# Patient Record
Sex: Male | Born: 1945 | Race: White | Hispanic: No | Marital: Married | Smoking: Former smoker
Health system: Southern US, Community
[De-identification: ages and names within clinical notes are randomized; demographics above are authoritative.]

## PROBLEM LIST (undated history)

## (undated) DIAGNOSIS — R7989 Other specified abnormal findings of blood chemistry: Secondary | ICD-10-CM

## (undated) DIAGNOSIS — F329 Major depressive disorder, single episode, unspecified: Secondary | ICD-10-CM

## (undated) DIAGNOSIS — G43109 Migraine with aura, not intractable, without status migrainosus: Secondary | ICD-10-CM

## (undated) DIAGNOSIS — R718 Other abnormality of red blood cells: Secondary | ICD-10-CM

## (undated) DIAGNOSIS — M069 Rheumatoid arthritis, unspecified: Secondary | ICD-10-CM

## (undated) DIAGNOSIS — N62 Hypertrophy of breast: Secondary | ICD-10-CM

## (undated) DIAGNOSIS — R030 Elevated blood-pressure reading, without diagnosis of hypertension: Secondary | ICD-10-CM

## (undated) DIAGNOSIS — IMO0002 Reserved for concepts with insufficient information to code with codable children: Secondary | ICD-10-CM

## (undated) DIAGNOSIS — E785 Hyperlipidemia, unspecified: Secondary | ICD-10-CM

## (undated) DIAGNOSIS — G5600 Carpal tunnel syndrome, unspecified upper limb: Secondary | ICD-10-CM

## (undated) DIAGNOSIS — R3129 Other microscopic hematuria: Secondary | ICD-10-CM

## (undated) DIAGNOSIS — N200 Calculus of kidney: Secondary | ICD-10-CM

## (undated) DIAGNOSIS — J449 Chronic obstructive pulmonary disease, unspecified: Secondary | ICD-10-CM

## (undated) DIAGNOSIS — R945 Abnormal results of liver function studies: Secondary | ICD-10-CM

## (undated) DIAGNOSIS — J439 Emphysema, unspecified: Secondary | ICD-10-CM

## (undated) DIAGNOSIS — F419 Anxiety disorder, unspecified: Secondary | ICD-10-CM

## (undated) DIAGNOSIS — Z923 Personal history of irradiation: Secondary | ICD-10-CM

## (undated) DIAGNOSIS — F410 Panic disorder [episodic paroxysmal anxiety] without agoraphobia: Secondary | ICD-10-CM

## (undated) DIAGNOSIS — F32A Depression, unspecified: Secondary | ICD-10-CM

## (undated) DIAGNOSIS — Z720 Tobacco use: Secondary | ICD-10-CM

## (undated) HISTORY — DX: Tobacco use: Z72.0

## (undated) HISTORY — DX: Hypertrophy of breast: N62

## (undated) HISTORY — DX: Other microscopic hematuria: R31.29

## (undated) HISTORY — DX: Abnormal results of liver function studies: R94.5

## (undated) HISTORY — DX: Other specified abnormal findings of blood chemistry: R79.89

## (undated) HISTORY — DX: Personal history of irradiation: Z92.3

## (undated) HISTORY — DX: Calculus of kidney: N20.0

## (undated) HISTORY — DX: Anxiety disorder, unspecified: F41.9

## (undated) HISTORY — DX: Carpal tunnel syndrome, unspecified upper limb: G56.00

## (undated) HISTORY — DX: Elevated blood-pressure reading, without diagnosis of hypertension: R03.0

## (undated) HISTORY — DX: Hyperlipidemia, unspecified: E78.5

## (undated) HISTORY — DX: Migraine with aura, not intractable, without status migrainosus: G43.109

## (undated) HISTORY — DX: Emphysema, unspecified: J43.9

## (undated) HISTORY — PX: COLONOSCOPY: SHX174

## (undated) HISTORY — DX: Panic disorder (episodic paroxysmal anxiety): F41.0

## (undated) HISTORY — DX: Reserved for concepts with insufficient information to code with codable children: IMO0002

## (undated) HISTORY — DX: Chronic obstructive pulmonary disease, unspecified: J44.9

## (undated) HISTORY — DX: Other abnormality of red blood cells: R71.8

## (undated) HISTORY — PX: APPENDECTOMY: SHX54

## (undated) HISTORY — DX: Rheumatoid arthritis, unspecified: M06.9

---

## 1999-09-03 ENCOUNTER — Encounter: Admission: RE | Admit: 1999-09-03 | Discharge: 1999-09-03 | Payer: Self-pay | Admitting: Rheumatology

## 1999-09-03 ENCOUNTER — Encounter: Payer: Self-pay | Admitting: Rheumatology

## 2004-10-18 HISTORY — PX: CYSTOSTOMY W/ BLADDER BIOPSY: SHX1431

## 2005-04-29 ENCOUNTER — Encounter: Admission: RE | Admit: 2005-04-29 | Discharge: 2005-04-29 | Payer: Self-pay | Admitting: Rheumatology

## 2005-05-05 ENCOUNTER — Encounter: Admission: RE | Admit: 2005-05-05 | Discharge: 2005-05-05 | Payer: Self-pay | Admitting: Rheumatology

## 2005-05-12 ENCOUNTER — Ambulatory Visit (HOSPITAL_COMMUNITY): Admission: RE | Admit: 2005-05-12 | Discharge: 2005-05-12 | Payer: Self-pay | Admitting: Urology

## 2005-05-12 ENCOUNTER — Ambulatory Visit (HOSPITAL_BASED_OUTPATIENT_CLINIC_OR_DEPARTMENT_OTHER): Admission: RE | Admit: 2005-05-12 | Discharge: 2005-05-12 | Payer: Self-pay | Admitting: Urology

## 2005-05-12 ENCOUNTER — Encounter (INDEPENDENT_AMBULATORY_CARE_PROVIDER_SITE_OTHER): Payer: Self-pay | Admitting: Specialist

## 2005-08-03 ENCOUNTER — Encounter: Admission: RE | Admit: 2005-08-03 | Discharge: 2005-08-03 | Payer: Self-pay | Admitting: Rheumatology

## 2005-10-05 ENCOUNTER — Encounter: Admission: RE | Admit: 2005-10-05 | Discharge: 2005-10-05 | Payer: Self-pay | Admitting: Rheumatology

## 2006-01-31 ENCOUNTER — Encounter: Admission: RE | Admit: 2006-01-31 | Discharge: 2006-01-31 | Payer: Self-pay | Admitting: Rheumatology

## 2006-05-11 ENCOUNTER — Encounter: Admission: RE | Admit: 2006-05-11 | Discharge: 2006-05-11 | Payer: Self-pay | Admitting: Thoracic Surgery

## 2007-03-08 ENCOUNTER — Ambulatory Visit: Payer: Self-pay | Admitting: Thoracic Surgery

## 2007-03-08 ENCOUNTER — Encounter: Admission: RE | Admit: 2007-03-08 | Discharge: 2007-03-08 | Payer: Self-pay | Admitting: Thoracic Surgery

## 2007-09-07 ENCOUNTER — Ambulatory Visit: Payer: Self-pay | Admitting: Thoracic Surgery

## 2007-09-07 ENCOUNTER — Encounter: Admission: RE | Admit: 2007-09-07 | Discharge: 2007-09-07 | Payer: Self-pay | Admitting: Thoracic Surgery

## 2007-10-10 ENCOUNTER — Ambulatory Visit (HOSPITAL_COMMUNITY): Admission: RE | Admit: 2007-10-10 | Discharge: 2007-10-10 | Payer: Self-pay | Admitting: Internal Medicine

## 2008-01-01 ENCOUNTER — Encounter (HOSPITAL_COMMUNITY): Admission: RE | Admit: 2008-01-01 | Discharge: 2008-03-04 | Payer: Self-pay | Admitting: Internal Medicine

## 2008-02-03 ENCOUNTER — Encounter: Admission: RE | Admit: 2008-02-03 | Discharge: 2008-02-03 | Payer: Self-pay | Admitting: Rheumatology

## 2008-03-25 ENCOUNTER — Encounter (HOSPITAL_COMMUNITY): Admission: RE | Admit: 2008-03-25 | Discharge: 2008-05-31 | Payer: Self-pay | Admitting: Internal Medicine

## 2008-04-01 ENCOUNTER — Encounter: Admission: RE | Admit: 2008-04-01 | Discharge: 2008-04-01 | Payer: Self-pay | Admitting: Internal Medicine

## 2008-06-18 ENCOUNTER — Encounter (HOSPITAL_COMMUNITY): Admission: RE | Admit: 2008-06-18 | Discharge: 2008-06-18 | Payer: Self-pay | Admitting: Internal Medicine

## 2008-07-04 ENCOUNTER — Encounter: Admission: RE | Admit: 2008-07-04 | Discharge: 2008-07-04 | Payer: Self-pay | Admitting: Thoracic Surgery

## 2008-07-04 ENCOUNTER — Ambulatory Visit: Payer: Self-pay | Admitting: Thoracic Surgery

## 2008-09-10 ENCOUNTER — Ambulatory Visit (HOSPITAL_COMMUNITY): Admission: RE | Admit: 2008-09-10 | Discharge: 2008-09-10 | Payer: Self-pay | Admitting: Internal Medicine

## 2008-12-02 ENCOUNTER — Encounter (HOSPITAL_COMMUNITY): Admission: RE | Admit: 2008-12-02 | Discharge: 2009-02-20 | Payer: Self-pay | Admitting: Internal Medicine

## 2009-03-24 ENCOUNTER — Encounter (HOSPITAL_COMMUNITY): Admission: RE | Admit: 2009-03-24 | Discharge: 2009-06-22 | Payer: Self-pay | Admitting: Internal Medicine

## 2009-09-15 ENCOUNTER — Encounter (HOSPITAL_COMMUNITY): Admission: RE | Admit: 2009-09-15 | Discharge: 2009-10-17 | Payer: Self-pay | Admitting: Internal Medicine

## 2009-12-12 ENCOUNTER — Encounter (HOSPITAL_COMMUNITY): Admission: RE | Admit: 2009-12-12 | Discharge: 2010-03-10 | Payer: Self-pay | Admitting: Internal Medicine

## 2010-03-10 ENCOUNTER — Encounter (HOSPITAL_COMMUNITY): Admission: RE | Admit: 2010-03-10 | Discharge: 2010-03-10 | Payer: Self-pay | Admitting: Internal Medicine

## 2010-10-30 ENCOUNTER — Encounter
Admission: RE | Admit: 2010-10-30 | Discharge: 2010-10-30 | Payer: Self-pay | Source: Home / Self Care | Attending: Internal Medicine | Admitting: Internal Medicine

## 2011-03-02 NOTE — Letter (Signed)
July 04, 2008   Mark A. Perini, MD  [address]  [place], [place] [address]   Re:  MARCANTHONY, SLEIGHT                 DOB:  1946-01-15   Dear Loraine Leriche:   I saw the patient in the office today.  We have followed him for over 2  years with multiple CT scan for followup of some small pulmonary  nodules.  They got a repeat today and showed no evidence of change.  I  think these were all inflammatory.  I would just recommended that he get  at least a chest x-ray once a year, so I have encouraged him to refrain  from smoking.  His blood pressure was 154/75, pulse 65, respirations 18,  and sats were 96%.   Ines Bloomer, M.D.  Electronically Signed   DPB/MEDQ  D:  07/04/2008  T:  07/04/2008  Job:  [id number]

## 2011-03-05 NOTE — Assessment & Plan Note (Signed)
OFFICE VISIT   Dyk, Whitley L  DOB:  05/08/1946                                        September 08, 2007  CHART #:  [id number]   Mr. Rueth came for followup today.  His blood pressure is 170/70.  Pulse  86.  Respirations 18.  Saturations were 97%.  His CT scan showed no  evidence of change in the small nodules that he has.  It has now been  over 18 months, and with no change in these nodules.  I feel these are  probably inflammatory.  We will see him back again in 6 months for a  final check.   Ines Bloomer, M.D.  Electronically Signed   DPB/MEDQ  D:  09/08/2007  T:  09/09/2007  Job:  [id number]

## 2011-03-05 NOTE — Op Note (Signed)
NAMETERION, HEDMAN                 ACCOUNT NO.:  [id number]   MEDICAL RECORD NO.:  [id number]          PATIENT TYPE:  AMB   LOCATION:  [facility]                         FACILITY:  [facility]   PHYSICIAN:  Maretta Bees. Vonita Moss, M.D.DATE OF BIRTH:  1946/01/16   DATE OF PROCEDURE:  05/12/2005  DATE OF DISCHARGE:                                 OPERATIVE REPORT   PREOPERATIVE DIAGNOSIS:  Hematuria and positive NMP-22.   POSTOPERATIVE DIAGNOSIS:  Hematuria and positive NMP-22.   PROCEDURE:  Cystoscopy, cold cup bladder biopsy and bilateral retrograde  pyelograms with interpretation.   SURGEON:  Dr. Larey Dresser.   ANESTHESIA:  General.   INDICATIONS:  This 65 year old gentleman had microhematuria and a stone  protocol CT scan done in our office that was unremarkable. And NMP-22 was  done and it was positive so I was concerned about high risk of transitional  cell carcinoma of the urinary tract. So rather than an office cystoscopy, he  was scheduled for outpatient Cysto and workup under anesthesia.   DESCRIPTION OF PROCEDURE:  The patient was brought to the operating room,  placed in lithotomy position, external genitalia were prepped and draped in  the usual fashion. He was cystoscoped and the anterior urethra was normal.  The prostatic urethra was unremarkable. The bladder had no stones, tumors or  localized lesions. There was some very minimal diffuse increased  vascularity.   Bilateral retrograde pyelograms were obtained using the cone-tip ureteral  catheter and his upper tracts were nonobstructed delicate and no filling  defects were seen in the ureters or the pyelocaliceal systems.   Cold cup bladder biopsies were then taken randomly from the right wall, left  wall and dome and sent to pathology. The biopsy sites were fulgurated with  the Bugbee electrode. The bladder was emptied, the cystoscope removed and  the patient sent to the recovery room in good condition having tolerated the  procedure well.     _________________    LJP/MEDQ  D:  05/12/2005  T:  05/12/2005  Job:  [id number]   cc:   Loraine Leriche A. Waynard Edwards, M.D.  [address]  [place]  [place] [address]  Fax: [phone]

## 2011-09-27 ENCOUNTER — Encounter: Payer: Self-pay | Admitting: Pulmonary Disease

## 2011-09-28 ENCOUNTER — Ambulatory Visit (INDEPENDENT_AMBULATORY_CARE_PROVIDER_SITE_OTHER): Payer: 59 | Admitting: Pulmonary Disease

## 2011-09-28 ENCOUNTER — Encounter: Payer: Self-pay | Admitting: Pulmonary Disease

## 2011-09-28 DIAGNOSIS — J438 Other emphysema: Secondary | ICD-10-CM

## 2011-09-28 DIAGNOSIS — IMO0001 Reserved for inherently not codable concepts without codable children: Secondary | ICD-10-CM | POA: Insufficient documentation

## 2011-09-28 DIAGNOSIS — J8410 Pulmonary fibrosis, unspecified: Secondary | ICD-10-CM

## 2011-09-28 NOTE — Progress Notes (Signed)
  Subjective:    Patient ID: Kenneth [name], male    DOB: [birth year], 65 y.o.   MRN: [id number]  HPI The patient is a 65 year old male who I have been asked to see for questionable fibrotic changes on a recent chest CT.  The patient has a long-standing history of tobacco abuse, and carries the diagnosis of COPD.  He has not had PFTs for many years.  He has recently had a chest x-ray which showed increased interstitial markings, and this was followed by a chest CT for further evaluation.  This showed very significant emphysematous changes throughout both lungs, as well a subpleural cystic/bleb changes that are more consistent with emphysema than honeycombing.  There was no traction bronchiectasis, and very little subpleural reticular changes.  There were some fibrotic appearing abnormalities primarily in the right lower lobe, but there were no findings of classic interstitial lung disease.  The patient states that he does have dyspnea on exertion that he thinks is worse from one year ago.  He can walk a fairly long distance at a paced speed without significant shortness of breath.  He will not get winded bringing groceries in from the car, but does have difficulties going up one flight of steps due to musculoskeletal issues.  The patient has a cough that primarily occurs in the morning, and is productive of mucus.  His wife also notices a cough and breathing issues at night while sleeping.  The patient is a retired Data processing manager, and denies any known exposure to asbestos or other particulate exposures.  He has no unusual hobbies.  He does have a history of rheumatoid arthritis, for which he is on methotrexate.   Review of Systems  Constitutional: Positive for appetite change. Negative for fever and unexpected weight change.  HENT: Positive for congestion and sneezing. Negative for ear pain, nosebleeds, sore throat, rhinorrhea, trouble swallowing, dental problem, postnasal drip and sinus pressure.     Eyes: Negative for redness and itching.  Respiratory: Positive for cough. Negative for chest tightness, shortness of breath and wheezing.   Cardiovascular: Negative for palpitations and leg swelling.  Gastrointestinal: Negative for nausea and vomiting.  Genitourinary: Negative for dysuria.  Musculoskeletal: Positive for joint swelling.  Skin: Negative for rash.  Neurological: Positive for headaches.  Hematological: Does not bruise/bleed easily.  Psychiatric/Behavioral: Positive for dysphoric mood. The patient is not nervous/anxious.        Objective:   Physical Exam Constitutional:  Well developed, no acute distress  HENT:  Nares patent without discharge, but significantly narrowed bilat  Oropharynx without exudate, palate and uvula are elongated.   Eyes:  Perrla, eomi, no scleral icterus  Neck:  No JVD, no TMG  Cardiovascular:  Normal rate, regular rhythm, no rubs or gallops.  No murmurs        Intact distal pulses  Pulmonary :  Normal breath sounds, no stridor or respiratory distress   No rhonchi, or wheezing.  Minimal crackles in bases  Abdominal:  Soft, nondistended, bowel sounds present.  No tenderness noted.   Musculoskeletal:  No lower extremity edema noted.  Lymph Nodes:  No cervical lymphadenopathy noted  Skin:  No cyanosis noted  Neurologic:  Alert, appropriate, moves all 4 extremities without obvious deficit.         Assessment & Plan:

## 2011-09-28 NOTE — Assessment & Plan Note (Signed)
The patient has very significant emphysematous changes on his CT chest.  He has a history of long-standing smoking, and does have dyspnea on exertion to some degree.  I think he would benefit from full pulmonary function studies, and would consider a trial of long-acting bronchodilators if he indeed has significant obstructive disease.  I have explained to him that smoking cessation is his best and most important treatment.

## 2011-09-28 NOTE — Assessment & Plan Note (Signed)
The patient's CT chest shows primarily significant emphysematous and cystic changes throughout both lungs.  There are fibrotic changes primarily in the right base, but none of the classic findings for true interstitial lung disease.  He had no subpleural reticular changes, no true honeycombing, and no traction bronchiectasis.  I suspect this is all related to his emphysema, and he may have a component of respiratory bronchiolitis as well.  Given his known rheumatoid arthritis, I cannot totally exclude a very early interstitial process, but I think this unlikely.  There is really nothing to make me overly suspicious of methotrexate induced lung disease.  I do think it would be worthwhile to have a yearly chest x-ray to monitor the patient.

## 2011-09-28 NOTE — Patient Instructions (Signed)
Will set up for breathing tests, and see you back same day for review. Stop smoking.  This is the best thing you can do for yourself.

## 2011-10-29 ENCOUNTER — Ambulatory Visit: Payer: 59 | Admitting: Pulmonary Disease

## 2011-11-12 ENCOUNTER — Ambulatory Visit: Payer: 59 | Admitting: Pulmonary Disease

## 2011-12-06 ENCOUNTER — Ambulatory Visit: Payer: 59 | Admitting: Pulmonary Disease

## 2011-12-08 ENCOUNTER — Encounter: Payer: Self-pay | Admitting: Pulmonary Disease

## 2011-12-08 ENCOUNTER — Ambulatory Visit (INDEPENDENT_AMBULATORY_CARE_PROVIDER_SITE_OTHER): Payer: 59 | Admitting: Pulmonary Disease

## 2011-12-08 DIAGNOSIS — J438 Other emphysema: Secondary | ICD-10-CM

## 2011-12-08 DIAGNOSIS — IMO0001 Reserved for inherently not codable concepts without codable children: Secondary | ICD-10-CM

## 2011-12-08 DIAGNOSIS — J8410 Pulmonary fibrosis, unspecified: Secondary | ICD-10-CM

## 2011-12-08 LAB — PULMONARY FUNCTION TEST

## 2011-12-08 NOTE — Assessment & Plan Note (Signed)
The patient has only mild obstructive disease by his recent pulmonary function studies.  I have talked with him about trying a maintenance bronchodilator regimen to see if this will help his exertional tolerance.  The patient feels that his functional status is more limited by his arthritis symptoms than his breathing, but admits that he significantly limits his exertional activities overall.  I would like for him to try Spiriva for approximately the next 3 weeks to see if he notices a difference.  If he does not, I would leave him on albuterol for rescue alone.  I have also stressed to him that total smoking cessation is really his best treatment for his underlying emphysema.

## 2011-12-08 NOTE — Patient Instructions (Signed)
You need to try and stop smoking completely Will try spiriva one inhalation each am for next 3 weeks to see if you notice a difference.  Please call us in 3 weeks with your response. Will give you a sample of a rescue inhaler (xopenex).  Use 2 puffs up to every 6hrs if you have a breathing issues or emergency.

## 2011-12-08 NOTE — Progress Notes (Signed)
PFT done today. 

## 2011-12-08 NOTE — Progress Notes (Signed)
  Subjective:    Patient ID: Kenneth [name], male    DOB: October 29, 1945, 66 y.o.   MRN: [id number]  HPI The patient comes in today for followup after his recent pulmonary function studies.  He was found to have mild airflow obstruction, mild restriction, and a mild reduction in his diffusion capacity.  I have reviewed the study with him in detail, and answered all of his questions.   Review of Systems  Constitutional: Negative for fever and unexpected weight change.  HENT: Positive for ear pain, nosebleeds, congestion, rhinorrhea, sneezing, postnasal drip and sinus pressure. Negative for sore throat, trouble swallowing and dental problem.   Eyes: Positive for redness and itching.  Respiratory: Positive for cough and wheezing. Negative for chest tightness and shortness of breath.   Cardiovascular: Negative for palpitations and leg swelling.  Gastrointestinal: Positive for nausea. Negative for vomiting.  Genitourinary: Negative for dysuria.  Musculoskeletal: Positive for joint swelling.  Skin: Negative for rash.  Neurological: Positive for headaches.  Hematological: Bruises/bleeds easily.  Psychiatric/Behavioral: Positive for dysphoric mood. The patient is not nervous/anxious.        Objective:   Physical Exam Thin male in no acute distress Nose without purulence or discharge noted Chest with basilar crackles, no wheezes Lower extremities without edema, no cyanosis Alert and oriented, moves all 4 extremities.       Assessment & Plan:

## 2011-12-08 NOTE — Assessment & Plan Note (Signed)
The patient has very mild restriction on his PFTs, and most likely his interstitial changes are not associated with his rheumatoid disease or methotrexate given the CT scan appearance.  However, I would recommend a yearly chest x-ray to follow him for possible progression.

## 2012-03-01 ENCOUNTER — Encounter (HOSPITAL_COMMUNITY)
Admission: RE | Admit: 2012-03-01 | Discharge: 2012-03-01 | Disposition: A | Discharge status: Home / Self Care | Payer: 59 | Source: Ambulatory Visit | Attending: Internal Medicine | Admitting: Internal Medicine

## 2012-03-01 ENCOUNTER — Encounter (HOSPITAL_COMMUNITY): Payer: Self-pay

## 2012-03-01 DIAGNOSIS — M810 Age-related osteoporosis without current pathological fracture: Secondary | ICD-10-CM | POA: Insufficient documentation

## 2012-03-01 MED ORDER — SODIUM CHLORIDE 0.9 % IV SOLN
INTRAVENOUS | Status: AC
  Administered 2012-03-01: 250 mL via INTRAVENOUS

## 2012-03-01 MED ORDER — ZOLEDRONIC ACID 5 MG/100ML IV SOLN
5.0000 mg | Freq: Once | INTRAVENOUS | Status: AC
  Administered 2012-03-01: 5 mg via INTRAVENOUS
  Filled 2012-03-01: qty 100

## 2012-03-01 NOTE — Discharge Instructions (Signed)
Zoledronic Acid injection (Paget's Disease, Osteoporosis) What is this medicine? ZOLEDRONIC ACID (ZOE le dron ik AS id) lowers the amount of calcium loss from bone. It is used to treat Paget's disease and osteoporosis in women. This medicine may be used for other purposes; ask your health care provider or pharmacist if you have questions. What should I tell my health care provider before I take this medicine? They need to know if you have any of these conditions: -aspirin-sensitive asthma -dental disease -kidney disease -low levels of calcium in the blood -past surgery on the parathyroid gland or intestines -an unusual or allergic reaction to zoledronic acid, other medicines, foods, dyes, or preservatives -pregnant or trying to get pregnant -breast-feeding How should I use this medicine? This medicine is for infusion into a vein. It is given by a health care professional in a hospital or clinic setting. Talk to your pediatrician regarding the use of this medicine in children. This medicine is not approved for use in children. Overdosage: If you think you have taken too much of this medicine contact a poison control center or emergency room at once. NOTE: This medicine is only for you. Do not share this medicine with others. What if I miss a dose? It is important not to miss your dose. Call your doctor or health care professional if you are unable to keep an appointment. What may interact with this medicine? -certain antibiotics given by injection -NSAIDs, medicines for pain and inflammation, like ibuprofen or naproxen -some diuretics like bumetanide, furosemide -teriparatide This list may not describe all possible interactions. Give your health care provider a list of all the medicines, herbs, non-prescription drugs, or dietary supplements you use. Also tell them if you smoke, drink alcohol, or use illegal drugs. Some items may interact with your medicine. What should I watch for while  using this medicine? Visit your doctor or health care professional for regular checkups. It may be some time before you see the benefit from this medicine. Do not stop taking your medicine unless your doctor tells you to. Your doctor may order blood tests or other tests to see how you are doing. Women should inform their doctor if they wish to become pregnant or think they might be pregnant. There is a potential for serious side effects to an unborn child. Talk to your health care professional or pharmacist for more information. You should make sure that you get enough calcium and vitamin D while you are taking this medicine. Discuss the foods you eat and the vitamins you take with your health care professional. Some people who take this medicine have severe bone, joint, and/or muscle pain. This medicine may also increase your risk for a broken thigh bone. Tell your doctor right away if you have pain in your upper leg or groin. Tell your doctor if you have any pain that does not go away or that gets worse. What side effects may I notice from receiving this medicine? Side effects that you should report to your doctor or health care professional as soon as possible: -allergic reactions like skin rash, itching or hives, swelling of the face, lips, or tongue -breathing problems -changes in vision -feeling faint or lightheaded, falls -jaw burning, cramping, or pain -muscle cramps, stiffness, or weakness -trouble passing urine or change in the amount of urine Side effects that usually do not require medical attention (report to your doctor or health care professional if they continue or are bothersome): -bone, joint, or muscle pain -fever -  irritation at site where injected -loss of appetite -nausea, vomiting -stomach upset -tired This list may not describe all possible side effects. Call your doctor for medical advice about side effects. You may report side effects to FDA at 1-800-FDA-1088. Where  should I keep my medicine? This drug is given in a hospital or clinic and will not be stored at home. NOTE: This sheet is a summary. It may not cover all possible information. If you have questions about this medicine, talk to your doctor, pharmacist, or health care provider.  2012, [employer]. (04/02/2011 9:08:15 AM) 

## 2012-06-15 DIAGNOSIS — M069 Rheumatoid arthritis, unspecified: Secondary | ICD-10-CM | POA: Diagnosis not present

## 2012-06-15 DIAGNOSIS — Z79899 Other long term (current) drug therapy: Secondary | ICD-10-CM | POA: Diagnosis not present

## 2012-07-25 DIAGNOSIS — Z23 Encounter for immunization: Secondary | ICD-10-CM | POA: Diagnosis not present

## 2012-08-03 DIAGNOSIS — Z79899 Other long term (current) drug therapy: Secondary | ICD-10-CM | POA: Diagnosis not present

## 2012-08-03 DIAGNOSIS — M069 Rheumatoid arthritis, unspecified: Secondary | ICD-10-CM | POA: Diagnosis not present

## 2012-08-15 DIAGNOSIS — J449 Chronic obstructive pulmonary disease, unspecified: Secondary | ICD-10-CM | POA: Diagnosis not present

## 2012-08-15 DIAGNOSIS — F411 Generalized anxiety disorder: Secondary | ICD-10-CM | POA: Diagnosis not present

## 2012-08-15 DIAGNOSIS — M069 Rheumatoid arthritis, unspecified: Secondary | ICD-10-CM | POA: Diagnosis not present

## 2012-08-15 DIAGNOSIS — F17200 Nicotine dependence, unspecified, uncomplicated: Secondary | ICD-10-CM | POA: Diagnosis not present

## 2012-08-29 DIAGNOSIS — Z79899 Other long term (current) drug therapy: Secondary | ICD-10-CM | POA: Diagnosis not present

## 2012-08-29 DIAGNOSIS — M069 Rheumatoid arthritis, unspecified: Secondary | ICD-10-CM | POA: Diagnosis not present

## 2012-10-05 ENCOUNTER — Ambulatory Visit (HOSPITAL_COMMUNITY): Payer: 59

## 2012-10-06 ENCOUNTER — Other Ambulatory Visit (HOSPITAL_COMMUNITY): Payer: Self-pay | Admitting: Rheumatology

## 2012-10-09 ENCOUNTER — Encounter (HOSPITAL_COMMUNITY)
Admission: RE | Admit: 2012-10-09 | Discharge: 2012-10-09 | Disposition: A | Discharge status: Home / Self Care | Payer: Medicare Other | Source: Ambulatory Visit | Attending: Rheumatology | Admitting: Rheumatology

## 2012-10-09 ENCOUNTER — Encounter (HOSPITAL_COMMUNITY): Payer: Self-pay

## 2012-10-09 DIAGNOSIS — M069 Rheumatoid arthritis, unspecified: Secondary | ICD-10-CM | POA: Diagnosis not present

## 2012-10-09 MED ORDER — SODIUM CHLORIDE 0.9 % IV SOLN: 100.0000 mg | Freq: Once | INTRAVENOUS | Status: DC

## 2012-10-09 MED ORDER — SODIUM CHLORIDE 0.9 % IV SOLN
100.0000 mg | Freq: Once | INTRAVENOUS | Status: AC
  Administered 2012-10-09: 100 mg via INTRAVENOUS
  Filled 2012-10-09: qty 8

## 2012-10-09 MED ORDER — SODIUM CHLORIDE 0.9 % IV SOLN
INTRAVENOUS | Status: DC
  Administered 2012-10-09: 10:00:00 via INTRAVENOUS

## 2012-10-09 MED ORDER — DIPHENHYDRAMINE HCL 50 MG/ML IJ SOLN
25.0000 mg | INTRAMUSCULAR | Status: DC | PRN
  Filled 2012-10-09: qty 1

## 2012-10-09 MED ORDER — SODIUM CHLORIDE 0.9 % IV SOLN: 100.0000 mg | INTRAVENOUS | Status: DC

## 2012-10-09 NOTE — Progress Notes (Addendum)
Patient states he aches every so often and contributes that to his medications. Denies toothaches, ear aches, fever chills and signs of infection. Patient coughs occasionally, but states that is due to COPD.

## 2012-10-09 NOTE — Progress Notes (Addendum)
Patient monitored for 30 minutes following infusion. Patient had no complaints following infusion. Instructed to call for any concerns and was given information on signs and symptoms to watch for.

## 2012-10-24 DIAGNOSIS — M069 Rheumatoid arthritis, unspecified: Secondary | ICD-10-CM | POA: Diagnosis not present

## 2012-10-24 DIAGNOSIS — Z79899 Other long term (current) drug therapy: Secondary | ICD-10-CM | POA: Diagnosis not present

## 2012-11-02 DIAGNOSIS — M069 Rheumatoid arthritis, unspecified: Secondary | ICD-10-CM | POA: Diagnosis not present

## 2012-11-02 DIAGNOSIS — F17200 Nicotine dependence, unspecified, uncomplicated: Secondary | ICD-10-CM | POA: Diagnosis not present

## 2012-11-02 DIAGNOSIS — M545 Low back pain: Secondary | ICD-10-CM | POA: Diagnosis not present

## 2012-11-06 ENCOUNTER — Encounter (HOSPITAL_COMMUNITY)
Admission: RE | Admit: 2012-11-06 | Discharge: 2012-11-06 | Disposition: A | Discharge status: Home / Self Care | Payer: Medicare Other | Source: Ambulatory Visit | Attending: Rheumatology | Admitting: Rheumatology

## 2012-11-06 ENCOUNTER — Encounter (HOSPITAL_COMMUNITY): Payer: Self-pay

## 2012-11-06 DIAGNOSIS — M069 Rheumatoid arthritis, unspecified: Secondary | ICD-10-CM | POA: Insufficient documentation

## 2012-11-06 MED ORDER — SODIUM CHLORIDE 0.9 % IV SOLN
100.0000 mg | Freq: Once | INTRAVENOUS | Status: AC
  Administered 2012-11-06: 100 mg via INTRAVENOUS
  Filled 2012-11-06: qty 8

## 2012-11-06 MED ORDER — DIPHENHYDRAMINE HCL 50 MG/ML IJ SOLN: 25.0000 mg | INTRAMUSCULAR | Status: DC | PRN

## 2012-11-06 MED ORDER — SODIUM CHLORIDE 0.9 % IV SOLN
INTRAVENOUS | Status: DC
  Administered 2012-11-06: 09:00:00 via INTRAVENOUS

## 2012-12-05 ENCOUNTER — Ambulatory Visit (HOSPITAL_COMMUNITY): Payer: Medicare Other

## 2013-01-01 ENCOUNTER — Encounter (HOSPITAL_COMMUNITY): Admission: RE | Admit: 2013-01-01 | Payer: Medicare Other | Source: Ambulatory Visit

## 2013-01-03 ENCOUNTER — Encounter (HOSPITAL_COMMUNITY)
Admission: RE | Admit: 2013-01-03 | Discharge: 2013-01-03 | Disposition: A | Discharge status: Home / Self Care | Payer: Medicare Other | Source: Ambulatory Visit | Attending: Rheumatology | Admitting: Rheumatology

## 2013-01-03 DIAGNOSIS — M069 Rheumatoid arthritis, unspecified: Secondary | ICD-10-CM | POA: Insufficient documentation

## 2013-01-03 NOTE — Progress Notes (Signed)
Patient stated he feels like he has the "crud" since Sunday. He was in bed all day Sunday and feels tightness in his face. States he has not taken his temperature, feeling like he has flu is normal for him, but this has lasted longer than usual. He was afebrile today. Called Dr. Ines Bloomer office and spoke with Corrie Dandy to inform of what patient tells me. She spoke with Dr. Kellie Simmering and stated not to give [facility] today, but can rescheduled in one week. Encouraged patient to follow up with Dr. Kellie Simmering. Verbalizes understanding. Also requested office fax new orders since they will no longer be current after today.

## 2013-01-10 ENCOUNTER — Encounter (HOSPITAL_COMMUNITY)
Admission: RE | Admit: 2013-01-10 | Discharge: 2013-01-10 | Disposition: A | Discharge status: Home / Self Care | Payer: Medicare Other | Source: Ambulatory Visit | Attending: Rheumatology | Admitting: Rheumatology

## 2013-01-10 DIAGNOSIS — F17200 Nicotine dependence, unspecified, uncomplicated: Secondary | ICD-10-CM | POA: Diagnosis not present

## 2013-01-10 DIAGNOSIS — J309 Allergic rhinitis, unspecified: Secondary | ICD-10-CM | POA: Diagnosis not present

## 2013-01-10 DIAGNOSIS — J449 Chronic obstructive pulmonary disease, unspecified: Secondary | ICD-10-CM | POA: Diagnosis not present

## 2013-01-10 DIAGNOSIS — M069 Rheumatoid arthritis, unspecified: Secondary | ICD-10-CM | POA: Diagnosis not present

## 2013-01-23 ENCOUNTER — Encounter (HOSPITAL_COMMUNITY): Payer: Self-pay

## 2013-01-23 ENCOUNTER — Encounter (HOSPITAL_COMMUNITY)
Admission: RE | Admit: 2013-01-23 | Discharge: 2013-01-23 | Disposition: A | Discharge status: Home / Self Care | Payer: Medicare Other | Source: Ambulatory Visit | Attending: Rheumatology | Admitting: Rheumatology

## 2013-01-23 DIAGNOSIS — M069 Rheumatoid arthritis, unspecified: Secondary | ICD-10-CM | POA: Diagnosis not present

## 2013-01-23 MED ORDER — SODIUM CHLORIDE 0.9 % IV SOLN
INTRAVENOUS | Status: DC
  Administered 2013-01-23: 16:00:00 via INTRAVENOUS

## 2013-01-23 MED ORDER — DIPHENHYDRAMINE HCL 50 MG/ML IJ SOLN: 25.0000 mg | INTRAMUSCULAR | Status: DC | PRN

## 2013-01-23 MED ORDER — SODIUM CHLORIDE 0.9 % IV SOLN
100.0000 mg | INTRAVENOUS | Status: DC
  Administered 2013-01-23: 100 mg via INTRAVENOUS
  Filled 2013-01-23: qty 8

## 2013-01-24 DIAGNOSIS — Z79899 Other long term (current) drug therapy: Secondary | ICD-10-CM | POA: Diagnosis not present

## 2013-01-24 DIAGNOSIS — M069 Rheumatoid arthritis, unspecified: Secondary | ICD-10-CM | POA: Diagnosis not present

## 2013-01-24 DIAGNOSIS — F329 Major depressive disorder, single episode, unspecified: Secondary | ICD-10-CM | POA: Diagnosis not present

## 2013-01-24 DIAGNOSIS — F17200 Nicotine dependence, unspecified, uncomplicated: Secondary | ICD-10-CM | POA: Diagnosis not present

## 2013-03-15 DIAGNOSIS — F322 Major depressive disorder, single episode, severe without psychotic features: Secondary | ICD-10-CM | POA: Diagnosis not present

## 2013-03-20 ENCOUNTER — Encounter (HOSPITAL_COMMUNITY): Payer: Self-pay

## 2013-03-20 ENCOUNTER — Encounter (HOSPITAL_COMMUNITY)
Admission: RE | Admit: 2013-03-20 | Discharge: 2013-03-20 | Disposition: A | Discharge status: Home / Self Care | Payer: Medicare Other | Source: Ambulatory Visit | Attending: Rheumatology | Admitting: Rheumatology

## 2013-03-20 DIAGNOSIS — M069 Rheumatoid arthritis, unspecified: Secondary | ICD-10-CM | POA: Diagnosis not present

## 2013-03-20 HISTORY — DX: Depression, unspecified: F32.A

## 2013-03-20 HISTORY — DX: Major depressive disorder, single episode, unspecified: F32.9

## 2013-03-20 MED ORDER — SODIUM CHLORIDE 0.9 % IV SOLN
100.0000 mg | INTRAVENOUS | Status: AC
  Administered 2013-03-20: 100 mg via INTRAVENOUS
  Filled 2013-03-20: qty 8

## 2013-03-20 MED ORDER — SODIUM CHLORIDE 0.9 % IV SOLN
INTRAVENOUS | Status: AC
  Administered 2013-03-20: 12:00:00 via INTRAVENOUS

## 2013-04-10 DIAGNOSIS — E785 Hyperlipidemia, unspecified: Secondary | ICD-10-CM | POA: Diagnosis not present

## 2013-04-10 DIAGNOSIS — Z125 Encounter for screening for malignant neoplasm of prostate: Secondary | ICD-10-CM | POA: Diagnosis not present

## 2013-04-10 DIAGNOSIS — M899 Disorder of bone, unspecified: Secondary | ICD-10-CM | POA: Diagnosis not present

## 2013-04-10 DIAGNOSIS — Z79899 Other long term (current) drug therapy: Secondary | ICD-10-CM | POA: Diagnosis not present

## 2013-04-10 DIAGNOSIS — M949 Disorder of cartilage, unspecified: Secondary | ICD-10-CM | POA: Diagnosis not present

## 2013-04-12 DIAGNOSIS — F322 Major depressive disorder, single episode, severe without psychotic features: Secondary | ICD-10-CM | POA: Diagnosis not present

## 2013-04-17 DIAGNOSIS — J449 Chronic obstructive pulmonary disease, unspecified: Secondary | ICD-10-CM | POA: Diagnosis not present

## 2013-04-17 DIAGNOSIS — Z1212 Encounter for screening for malignant neoplasm of rectum: Secondary | ICD-10-CM | POA: Diagnosis not present

## 2013-04-17 DIAGNOSIS — F329 Major depressive disorder, single episode, unspecified: Secondary | ICD-10-CM | POA: Diagnosis not present

## 2013-04-17 DIAGNOSIS — E785 Hyperlipidemia, unspecified: Secondary | ICD-10-CM | POA: Diagnosis not present

## 2013-04-17 DIAGNOSIS — Z23 Encounter for immunization: Secondary | ICD-10-CM | POA: Diagnosis not present

## 2013-04-17 DIAGNOSIS — Z0000 Encounter for general adult medical examination without abnormal findings: Secondary | ICD-10-CM | POA: Diagnosis not present

## 2013-04-17 DIAGNOSIS — Z125 Encounter for screening for malignant neoplasm of prostate: Secondary | ICD-10-CM | POA: Diagnosis not present

## 2013-04-17 DIAGNOSIS — M069 Rheumatoid arthritis, unspecified: Secondary | ICD-10-CM | POA: Diagnosis not present

## 2013-04-17 DIAGNOSIS — IMO0002 Reserved for concepts with insufficient information to code with codable children: Secondary | ICD-10-CM | POA: Diagnosis not present

## 2013-04-17 DIAGNOSIS — J8410 Pulmonary fibrosis, unspecified: Secondary | ICD-10-CM | POA: Diagnosis not present

## 2013-04-17 DIAGNOSIS — M545 Low back pain: Secondary | ICD-10-CM | POA: Diagnosis not present

## 2013-04-24 DIAGNOSIS — M069 Rheumatoid arthritis, unspecified: Secondary | ICD-10-CM | POA: Diagnosis not present

## 2013-04-24 DIAGNOSIS — M545 Low back pain: Secondary | ICD-10-CM | POA: Diagnosis not present

## 2013-04-24 DIAGNOSIS — F329 Major depressive disorder, single episode, unspecified: Secondary | ICD-10-CM | POA: Diagnosis not present

## 2013-04-24 DIAGNOSIS — Z79899 Other long term (current) drug therapy: Secondary | ICD-10-CM | POA: Diagnosis not present

## 2013-05-10 DIAGNOSIS — F322 Major depressive disorder, single episode, severe without psychotic features: Secondary | ICD-10-CM | POA: Diagnosis not present

## 2013-05-10 NOTE — Progress Notes (Signed)
Patient's next scheduled appointment was for 05/15/13 for Simponi. We needed new orders for that infusion. Called Dr Jiles Garter office to request new orders and was told by answering service that office was closed from 05/09/13 until 05/22/13 and Dr Kellie Simmering was unreachable. Since we did not have orders for patient we called Mr Baylock to reschedule his infusion for 05/24/13. Pt was agreeable to this

## 2013-05-11 ENCOUNTER — Encounter: Payer: Self-pay | Admitting: Internal Medicine

## 2013-05-15 ENCOUNTER — Encounter (HOSPITAL_COMMUNITY): Admission: RE | Admit: 2013-05-15 | Payer: Medicare Other | Source: Ambulatory Visit

## 2013-05-15 DIAGNOSIS — M069 Rheumatoid arthritis, unspecified: Secondary | ICD-10-CM | POA: Diagnosis not present

## 2013-05-15 DIAGNOSIS — Z79899 Other long term (current) drug therapy: Secondary | ICD-10-CM | POA: Diagnosis not present

## 2013-05-24 ENCOUNTER — Encounter (HOSPITAL_COMMUNITY)
Admission: RE | Admit: 2013-05-24 | Discharge: 2013-05-24 | Disposition: A | Discharge status: Home / Self Care | Payer: Medicare Other | Source: Ambulatory Visit | Attending: Rheumatology | Admitting: Rheumatology

## 2013-05-24 ENCOUNTER — Other Ambulatory Visit (HOSPITAL_COMMUNITY): Payer: Self-pay | Admitting: Rheumatology

## 2013-05-24 DIAGNOSIS — F322 Major depressive disorder, single episode, severe without psychotic features: Secondary | ICD-10-CM | POA: Diagnosis not present

## 2013-05-24 DIAGNOSIS — M069 Rheumatoid arthritis, unspecified: Secondary | ICD-10-CM | POA: Diagnosis not present

## 2013-05-24 MED ORDER — SODIUM CHLORIDE 0.9 % IV SOLN
100.0000 mg | INTRAVENOUS | Status: DC
  Administered 2013-05-24: 100 mg via INTRAVENOUS
  Filled 2013-05-24: qty 8

## 2013-05-24 MED ORDER — SODIUM CHLORIDE 0.9 % IV SOLN
INTRAVENOUS | Status: DC
  Administered 2013-05-24: 12:00:00 via INTRAVENOUS

## 2013-05-24 NOTE — Progress Notes (Signed)
Uneventful infusion of SIMPONI Aria. Pt did have some elevated BP upon arrival to Short Stay prior to infusion and after infusion but states he feels fine and will contact his MD for any further questions or concerns about his BP.

## 2013-05-31 DIAGNOSIS — F322 Major depressive disorder, single episode, severe without psychotic features: Secondary | ICD-10-CM | POA: Diagnosis not present

## 2013-06-07 DIAGNOSIS — F322 Major depressive disorder, single episode, severe without psychotic features: Secondary | ICD-10-CM | POA: Diagnosis not present

## 2013-06-08 DIAGNOSIS — F322 Major depressive disorder, single episode, severe without psychotic features: Secondary | ICD-10-CM | POA: Diagnosis not present

## 2013-06-14 DIAGNOSIS — F322 Major depressive disorder, single episode, severe without psychotic features: Secondary | ICD-10-CM | POA: Diagnosis not present

## 2013-06-28 DIAGNOSIS — F322 Major depressive disorder, single episode, severe without psychotic features: Secondary | ICD-10-CM | POA: Diagnosis not present

## 2013-07-05 DIAGNOSIS — F322 Major depressive disorder, single episode, severe without psychotic features: Secondary | ICD-10-CM | POA: Diagnosis not present

## 2013-07-10 ENCOUNTER — Encounter (HOSPITAL_COMMUNITY): Payer: Medicare Other

## 2013-07-19 ENCOUNTER — Encounter (HOSPITAL_COMMUNITY): Payer: Self-pay

## 2013-07-19 ENCOUNTER — Encounter (HOSPITAL_COMMUNITY)
Admission: RE | Admit: 2013-07-19 | Discharge: 2013-07-19 | Disposition: A | Discharge status: Home / Self Care | Payer: Medicare Other | Source: Ambulatory Visit | Attending: Rheumatology | Admitting: Rheumatology

## 2013-07-19 DIAGNOSIS — F322 Major depressive disorder, single episode, severe without psychotic features: Secondary | ICD-10-CM | POA: Diagnosis not present

## 2013-07-19 DIAGNOSIS — M069 Rheumatoid arthritis, unspecified: Secondary | ICD-10-CM | POA: Diagnosis not present

## 2013-07-19 MED ORDER — SODIUM CHLORIDE 0.9 % IV SOLN
100.0000 mg | INTRAVENOUS | Status: AC
  Administered 2013-07-19: 100 mg via INTRAVENOUS
  Filled 2013-07-19: qty 8

## 2013-07-19 MED ORDER — SODIUM CHLORIDE 0.9 % IV SOLN
INTRAVENOUS | Status: AC
  Administered 2013-07-19: 13:00:00 via INTRAVENOUS

## 2013-08-02 ENCOUNTER — Other Ambulatory Visit: Payer: Self-pay | Admitting: Internal Medicine

## 2013-08-02 DIAGNOSIS — F322 Major depressive disorder, single episode, severe without psychotic features: Secondary | ICD-10-CM | POA: Diagnosis not present

## 2013-08-02 DIAGNOSIS — IMO0002 Reserved for concepts with insufficient information to code with codable children: Secondary | ICD-10-CM

## 2013-08-02 DIAGNOSIS — Z23 Encounter for immunization: Secondary | ICD-10-CM | POA: Diagnosis not present

## 2013-08-02 DIAGNOSIS — M545 Low back pain: Secondary | ICD-10-CM

## 2013-08-09 ENCOUNTER — Ambulatory Visit
Admission: RE | Admit: 2013-08-09 | Discharge: 2013-08-09 | Disposition: A | Discharge status: Home / Self Care | Payer: Medicare Other | Source: Ambulatory Visit | Attending: Internal Medicine | Admitting: Internal Medicine

## 2013-08-09 DIAGNOSIS — M47817 Spondylosis without myelopathy or radiculopathy, lumbosacral region: Secondary | ICD-10-CM | POA: Diagnosis not present

## 2013-08-09 DIAGNOSIS — M545 Low back pain: Secondary | ICD-10-CM

## 2013-08-09 DIAGNOSIS — IMO0002 Reserved for concepts with insufficient information to code with codable children: Secondary | ICD-10-CM

## 2013-08-09 DIAGNOSIS — M48061 Spinal stenosis, lumbar region without neurogenic claudication: Secondary | ICD-10-CM | POA: Diagnosis not present

## 2013-08-16 DIAGNOSIS — F322 Major depressive disorder, single episode, severe without psychotic features: Secondary | ICD-10-CM | POA: Diagnosis not present

## 2013-08-21 DIAGNOSIS — M545 Low back pain: Secondary | ICD-10-CM | POA: Diagnosis not present

## 2013-08-21 DIAGNOSIS — M069 Rheumatoid arthritis, unspecified: Secondary | ICD-10-CM | POA: Diagnosis not present

## 2013-08-21 DIAGNOSIS — F17200 Nicotine dependence, unspecified, uncomplicated: Secondary | ICD-10-CM | POA: Diagnosis not present

## 2013-08-21 DIAGNOSIS — Z79899 Other long term (current) drug therapy: Secondary | ICD-10-CM | POA: Diagnosis not present

## 2013-08-24 DIAGNOSIS — Z79899 Other long term (current) drug therapy: Secondary | ICD-10-CM | POA: Diagnosis not present

## 2013-08-24 DIAGNOSIS — M069 Rheumatoid arthritis, unspecified: Secondary | ICD-10-CM | POA: Diagnosis not present

## 2013-08-24 DIAGNOSIS — R634 Abnormal weight loss: Secondary | ICD-10-CM | POA: Diagnosis not present

## 2013-08-24 DIAGNOSIS — M545 Low back pain: Secondary | ICD-10-CM | POA: Diagnosis not present

## 2013-09-04 DIAGNOSIS — F322 Major depressive disorder, single episode, severe without psychotic features: Secondary | ICD-10-CM | POA: Diagnosis not present

## 2013-09-06 DIAGNOSIS — F322 Major depressive disorder, single episode, severe without psychotic features: Secondary | ICD-10-CM | POA: Diagnosis not present

## 2013-09-14 ENCOUNTER — Other Ambulatory Visit (HOSPITAL_COMMUNITY): Payer: Self-pay | Admitting: Rheumatology

## 2013-09-17 ENCOUNTER — Encounter (HOSPITAL_COMMUNITY): Admission: RE | Admit: 2013-09-17 | Payer: Medicare Other | Source: Ambulatory Visit

## 2013-09-20 DIAGNOSIS — F322 Major depressive disorder, single episode, severe without psychotic features: Secondary | ICD-10-CM | POA: Diagnosis not present

## 2013-10-01 ENCOUNTER — Other Ambulatory Visit (HOSPITAL_COMMUNITY): Payer: Self-pay | Admitting: Rheumatology

## 2013-10-01 ENCOUNTER — Encounter (HOSPITAL_COMMUNITY)
Admission: RE | Admit: 2013-10-01 | Discharge: 2013-10-01 | Disposition: A | Discharge status: Home / Self Care | Payer: Medicare Other | Source: Ambulatory Visit | Attending: Rheumatology | Admitting: Rheumatology

## 2013-10-01 ENCOUNTER — Encounter (HOSPITAL_COMMUNITY): Payer: Self-pay

## 2013-10-01 DIAGNOSIS — M069 Rheumatoid arthritis, unspecified: Secondary | ICD-10-CM | POA: Insufficient documentation

## 2013-10-01 MED ORDER — SODIUM CHLORIDE 0.9 % IV SOLN
100.0000 mg | INTRAVENOUS | Status: DC
  Administered 2013-10-01: 100 mg via INTRAVENOUS
  Filled 2013-10-01: qty 8

## 2013-10-01 MED ORDER — SODIUM CHLORIDE 0.9 % IV SOLN
INTRAVENOUS | Status: DC
  Administered 2013-10-01: 12:00:00 via INTRAVENOUS

## 2013-10-01 MED ORDER — SODIUM CHLORIDE 0.9 % IV SOLN
2.0000 mg/kg | INTRAVENOUS | Status: DC
  Filled 2013-10-01: qty 11.2

## 2013-10-04 DIAGNOSIS — F322 Major depressive disorder, single episode, severe without psychotic features: Secondary | ICD-10-CM | POA: Diagnosis not present

## 2013-10-25 DIAGNOSIS — M949 Disorder of cartilage, unspecified: Secondary | ICD-10-CM | POA: Diagnosis not present

## 2013-10-25 DIAGNOSIS — F3289 Other specified depressive episodes: Secondary | ICD-10-CM | POA: Diagnosis not present

## 2013-10-25 DIAGNOSIS — M899 Disorder of bone, unspecified: Secondary | ICD-10-CM | POA: Diagnosis not present

## 2013-10-25 DIAGNOSIS — F329 Major depressive disorder, single episode, unspecified: Secondary | ICD-10-CM | POA: Diagnosis not present

## 2013-10-25 DIAGNOSIS — IMO0002 Reserved for concepts with insufficient information to code with codable children: Secondary | ICD-10-CM | POA: Diagnosis not present

## 2013-10-25 DIAGNOSIS — M069 Rheumatoid arthritis, unspecified: Secondary | ICD-10-CM | POA: Diagnosis not present

## 2013-10-25 DIAGNOSIS — J449 Chronic obstructive pulmonary disease, unspecified: Secondary | ICD-10-CM | POA: Diagnosis not present

## 2013-11-12 DIAGNOSIS — F322 Major depressive disorder, single episode, severe without psychotic features: Secondary | ICD-10-CM | POA: Diagnosis not present

## 2013-11-20 DIAGNOSIS — M545 Low back pain, unspecified: Secondary | ICD-10-CM | POA: Diagnosis not present

## 2013-11-20 DIAGNOSIS — M069 Rheumatoid arthritis, unspecified: Secondary | ICD-10-CM | POA: Diagnosis not present

## 2013-11-20 DIAGNOSIS — R634 Abnormal weight loss: Secondary | ICD-10-CM | POA: Diagnosis not present

## 2013-11-20 DIAGNOSIS — Z79899 Other long term (current) drug therapy: Secondary | ICD-10-CM | POA: Diagnosis not present

## 2013-11-26 ENCOUNTER — Encounter (HOSPITAL_COMMUNITY)
Admission: RE | Admit: 2013-11-26 | Discharge: 2013-11-26 | Disposition: A | Discharge status: Home / Self Care | Payer: Medicare Other | Source: Ambulatory Visit | Attending: Rheumatology | Admitting: Rheumatology

## 2013-11-26 DIAGNOSIS — M069 Rheumatoid arthritis, unspecified: Secondary | ICD-10-CM | POA: Insufficient documentation

## 2013-11-26 MED ORDER — SODIUM CHLORIDE 0.9 % IV SOLN
INTRAVENOUS | Status: DC
  Administered 2013-11-26: 250 mL via INTRAVENOUS

## 2013-11-26 MED ORDER — SODIUM CHLORIDE 0.9 % IV SOLN
100.0000 mg | INTRAVENOUS | Status: DC
  Administered 2013-11-26: 100 mg via INTRAVENOUS
  Filled 2013-11-26: qty 8

## 2013-11-26 NOTE — Discharge Instructions (Signed)
SIMPONI ARIA Golimumab injection What is this medicine? GOLIMUMAB (goe LIM ue mab) is used to treat rheumatoid arthritis, psoriatic arthritis, ulcerative colitis, and ankylosing spondylitis. This medicine may be used for other purposes; ask your health care provider or pharmacist if you have questions. COMMON BRAND NAME(S): SIMPONI ARIA, Simponi What should I tell my health care provider before I take this medicine? They need to know if you have any of these conditions: -cancer -diabetes -heart disease -hepatitis B or history of hepatitis B infection -immune system problems -infection or history of infections -low blood counts like low white cell, platelet, or red cell counts -multiple sclerosis -recently received or scheduled to receive a vaccine -scheduled to have surgery -tuberculosis, a positive skin test for tuberculosis or have recently been in close contact with someone who has tuberculosis -an unusual reaction to golimumab, other medicines, latex, rubber, foods, dyes, or preservatives -pregnant or trying to get pregnant -breast-feeding How should I use this medicine? This medicine is for injection under the skin. You will be taught how to prepare and give this medicine. Use exactly as directed. Take your medicine at regular intervals. Do not take your medicine more often than directed. More information is available by calling [phone]. It is important that you put your used needles and syringes in a special sharps container. Do not put them in a trash can. If you do not have a sharps container, call your pharmacist or healthcare provider to get one. A special MedGuide will be given to you by the pharmacist with each prescription and refill. Be sure to read this information carefully each time. Talk to your pediatrician regarding the use of this medicine in children. Special care may be needed. Overdosage: If you think you've taken too much of this medicine contact a poison  control center or emergency room at once. Overdosage: If you think you have taken too much of this medicine contact a poison control center or emergency room at once. NOTE: This medicine is only for you. Do not share this medicine with others. What if I miss a dose? If you miss a dose, take it as soon as you can. If it is almost time for your next dose, take only that dose. Do not take double or extra doses. Call your doctor or health care professional if you are not sure how to handle a missed dose. What may interact with this medicine? Do not take this medicine with any of the following medications: -abatacept -adalimumab -anakinra -certolizumab -etanercept -infliximab -live virus vaccines -rilonacept This medicine may also interact with the following medications: -cyclosporine -rituximab -theophylline -vaccines -warfarin This list may not describe all possible interactions. Give your health care provider a list of all the medicines, herbs, non-prescription drugs, or dietary supplements you use. Also tell them if you smoke, drink alcohol, or use illegal drugs. Some items may interact with your medicine. What should I watch for while using this medicine? Visit your doctor or health care professional for regular checks on your progress. Tell your doctor or healthcare professional if your symptoms do not start to get better or if they get worse. You will be tested for tuberculosis (TB) before you start this medicine. If your doctor prescribes any medicine for TB, you should start taking the TB medicine before starting this medicine. Make sure to finish the full course of TB medicine. Call your doctor or health care professional if you get a cold or other infection while receiving this medicine. Do not treat  yourself. This medicine may decrease your body's ability to fight infection. Talk to your doctor about your risk of cancer. You may be more at risk for certain types of cancers if you  take this medicine. What side effects may I notice from receiving this medicine? Side effects that you should report to your doctor or health care professional as soon as possible: -allergic reactions like skin rash, itching or hives, swelling of the face, lips, or tongue -breathing problems -changes in vision -chest pain -dark urine -fever, chills, or any other sign of infection -light-colored stools -muscle pain or weakness -numbness or tingling -red, scaly patches or raised bumps on the skin -right upper belly pain -swelling of the ankles -swollen lymph nodes in the neck, underarm, or groin areas -unexplained weight loss -unusual bleeding or bruising -unusually weak or tired -yellowing of the eyes or skin Side effects that usually do not require medical attention (report to your doctor or health care professional if they continue or are bothersome): -dizziness -nausea -redness, itching, swelling, or bruising at site where injected This list may not describe all possible side effects. Call your doctor for medical advice about side effects. You may report side effects to FDA at 1-800-FDA-1088. Where should I keep my medicine? Keep out of the reach of children. Store in the original container and in the refrigerator between 2 and 8 degrees C (36 and 46 degrees F). Do not freeze. Protect from light. Throw away any unused medicine after the expiration date. NOTE: This sheet is a summary. It may not cover all possible information. If you have questions about this medicine, talk to your doctor, pharmacist, or health care provider.  2014, Elsevier/Gold Standard. (2012-05-09 13:41:25) Rheumatoid Arthritis Rheumatoid arthritis is a long-term (chronic) inflammatory disease that causes pain, swelling, and stiffness of the joints. It can affect the entire body, including the eyes and lungs. The effects of rheumatoid arthritis vary widely among those with the condition. CAUSES  The cause of  rheumatoid arthritis is not known. It tends to run in families and is more common in women. Certain cells of the body's natural defense system (immune system) do not work properly and begin to attack healthy joints. It primarily involves the connective tissue that lines the joints (synovial membrane). This can cause damage to the joint. SYMPTOMS   Pain, stiffness, swelling, and decreased motion of many joints, especially in the hands and feet.  Stiffness that is worse in the morning. It may last 1 2 hours or longer.  Numbness and tingling in the hands.  Fatigue.  Loss of appetite.  Weight loss.  Low-grade fever.  Dry eyes and mouth.  Firm lumps (rheumatoid nodules) that grow beneath the skin in areas such as the elbows and hands. DIAGNOSIS  Diagnosis is based on the symptoms described, an exam, and blood tests. Sometimes, X-rays are helpful. TREATMENT  The goals of treatment are to relieve pain, reduce inflammation, and to slow down or stop joint damage and disability. Methods vary and may include:  Maintaining a balance of rest, exercise, and proper nutrition.  Medicines:  Pain relievers (analgesics).  Corticosteroids and nonsteroidal anti-inflammatory drugs (NSAIDs) to reduce inflammation.  Disease-modifying antirheumatic drugs (DMARDs) to try to slow the course of the disease.  Biologic response modifiers to reduce inflammation and damage.  Physical therapy and occupational therapy.  Surgery for patients with severe joint damage. Joint replacement or fusing of joints may be needed.  Routine monitoring and ongoing care, such as office visits,  blood and urine tests, and X-rays. HOME CARE INSTRUCTIONS   Remain physically active and reduce activity when the disease gets worse.  Eat a well-balanced diet.  Put heat on affected joints when you wake up and before activities. Keep the heat on the affected joint for as long as directed by your caregiver.  Put ice on  affected joints following activities or exercising.  Put ice in a plastic bag.  Place a towel between your skin and the bag.  Leave the ice on for 15-20 minutes, 03-04 times a day.  Take all medicines and supplements as directed by your caregiver.  Use splints as directed by your caregiver. Splints help maintain joint position and function.  Do not sleep with pillows under your knees. This may lead to spasms.  Participate in a self-management program to keep current with the latest treatment and coping skills. SEEK IMMEDIATE MEDICAL CARE IF:  You have fainting episodes.  You have periods of extreme weakness.  You rapidly develop a hot, painful joint that is more severe than usual joint aches.  You have chills.  You have a fever. MAKE SURE YOU:  Understand these instructions.  Will watch your condition.  Will get help right away if you are not doing well or get worse. FOR MORE INFORMATION  American College of Rheumatology: www.rheumatology.[place]: www.arthritis.org Document Released: 10/01/2000 Document Revised: 04/04/2012 Document Reviewed: 11/10/2011 [facility] Patient Information 2014 [place], [place].

## 2013-12-03 DIAGNOSIS — F322 Major depressive disorder, single episode, severe without psychotic features: Secondary | ICD-10-CM | POA: Diagnosis not present

## 2013-12-24 DIAGNOSIS — F322 Major depressive disorder, single episode, severe without psychotic features: Secondary | ICD-10-CM | POA: Diagnosis not present

## 2014-01-04 ENCOUNTER — Encounter: Payer: Self-pay | Admitting: Internal Medicine

## 2014-01-10 DIAGNOSIS — F322 Major depressive disorder, single episode, severe without psychotic features: Secondary | ICD-10-CM | POA: Diagnosis not present

## 2014-01-17 DIAGNOSIS — F322 Major depressive disorder, single episode, severe without psychotic features: Secondary | ICD-10-CM | POA: Diagnosis not present

## 2014-01-21 ENCOUNTER — Encounter (HOSPITAL_COMMUNITY): Payer: Medicare Other

## 2014-01-22 DIAGNOSIS — M069 Rheumatoid arthritis, unspecified: Secondary | ICD-10-CM | POA: Diagnosis not present

## 2014-01-22 DIAGNOSIS — M545 Low back pain, unspecified: Secondary | ICD-10-CM | POA: Diagnosis not present

## 2014-01-22 DIAGNOSIS — F17200 Nicotine dependence, unspecified, uncomplicated: Secondary | ICD-10-CM | POA: Diagnosis not present

## 2014-01-22 DIAGNOSIS — Z79899 Other long term (current) drug therapy: Secondary | ICD-10-CM | POA: Diagnosis not present

## 2014-01-23 ENCOUNTER — Other Ambulatory Visit (HOSPITAL_COMMUNITY): Payer: Self-pay | Admitting: Rheumatology

## 2014-01-23 ENCOUNTER — Encounter (HOSPITAL_COMMUNITY): Admission: RE | Admit: 2014-01-23 | Payer: Medicare Other | Source: Ambulatory Visit

## 2014-01-28 ENCOUNTER — Encounter (HOSPITAL_COMMUNITY): Payer: Medicare Other

## 2014-02-12 ENCOUNTER — Encounter (HOSPITAL_COMMUNITY): Payer: Self-pay

## 2014-02-12 ENCOUNTER — Encounter (HOSPITAL_COMMUNITY)
Admission: RE | Admit: 2014-02-12 | Discharge: 2014-02-12 | Disposition: A | Discharge status: Home / Self Care | Payer: Medicare Other | Source: Ambulatory Visit | Attending: Rheumatology | Admitting: Rheumatology

## 2014-02-12 DIAGNOSIS — M069 Rheumatoid arthritis, unspecified: Secondary | ICD-10-CM | POA: Diagnosis not present

## 2014-02-12 MED ORDER — SODIUM CHLORIDE 0.9 % IV SOLN
100.0000 mg | INTRAVENOUS | Status: DC
  Administered 2014-02-12: 100 mg via INTRAVENOUS
  Filled 2014-02-12: qty 8

## 2014-02-12 MED ORDER — DIPHENHYDRAMINE HCL 50 MG/ML IJ SOLN: 25.0000 mg | INTRAMUSCULAR | Status: DC | PRN

## 2014-02-12 MED ORDER — SODIUM CHLORIDE 0.9 % IV SOLN
INTRAVENOUS | Status: DC
  Administered 2014-02-12: 15:00:00 via INTRAVENOUS

## 2014-02-14 DIAGNOSIS — F322 Major depressive disorder, single episode, severe without psychotic features: Secondary | ICD-10-CM | POA: Diagnosis not present

## 2014-02-25 DIAGNOSIS — M5126 Other intervertebral disc displacement, lumbar region: Secondary | ICD-10-CM | POA: Diagnosis not present

## 2014-02-25 DIAGNOSIS — M545 Low back pain, unspecified: Secondary | ICD-10-CM | POA: Diagnosis not present

## 2014-02-28 DIAGNOSIS — F322 Major depressive disorder, single episode, severe without psychotic features: Secondary | ICD-10-CM | POA: Diagnosis not present

## 2014-03-06 DIAGNOSIS — M899 Disorder of bone, unspecified: Secondary | ICD-10-CM | POA: Diagnosis not present

## 2014-03-07 DIAGNOSIS — F322 Major depressive disorder, single episode, severe without psychotic features: Secondary | ICD-10-CM | POA: Diagnosis not present

## 2014-03-08 DIAGNOSIS — M545 Low back pain, unspecified: Secondary | ICD-10-CM | POA: Diagnosis not present

## 2014-03-13 DIAGNOSIS — M069 Rheumatoid arthritis, unspecified: Secondary | ICD-10-CM | POA: Diagnosis not present

## 2014-03-13 DIAGNOSIS — F17200 Nicotine dependence, unspecified, uncomplicated: Secondary | ICD-10-CM | POA: Diagnosis not present

## 2014-03-13 DIAGNOSIS — M949 Disorder of cartilage, unspecified: Secondary | ICD-10-CM | POA: Diagnosis not present

## 2014-03-13 DIAGNOSIS — M545 Low back pain, unspecified: Secondary | ICD-10-CM | POA: Diagnosis not present

## 2014-03-13 DIAGNOSIS — M899 Disorder of bone, unspecified: Secondary | ICD-10-CM | POA: Diagnosis not present

## 2014-03-13 DIAGNOSIS — Z79899 Other long term (current) drug therapy: Secondary | ICD-10-CM | POA: Diagnosis not present

## 2014-03-14 DIAGNOSIS — F322 Major depressive disorder, single episode, severe without psychotic features: Secondary | ICD-10-CM | POA: Diagnosis not present

## 2014-03-17 ENCOUNTER — Encounter: Payer: Self-pay | Admitting: Internal Medicine

## 2014-03-21 DIAGNOSIS — F322 Major depressive disorder, single episode, severe without psychotic features: Secondary | ICD-10-CM | POA: Diagnosis not present

## 2014-03-22 DIAGNOSIS — M545 Low back pain, unspecified: Secondary | ICD-10-CM | POA: Diagnosis not present

## 2014-03-22 DIAGNOSIS — M47817 Spondylosis without myelopathy or radiculopathy, lumbosacral region: Secondary | ICD-10-CM | POA: Diagnosis not present

## 2014-03-22 DIAGNOSIS — M5126 Other intervertebral disc displacement, lumbar region: Secondary | ICD-10-CM | POA: Diagnosis not present

## 2014-04-09 ENCOUNTER — Encounter (HOSPITAL_COMMUNITY): Payer: Self-pay

## 2014-04-09 ENCOUNTER — Encounter (HOSPITAL_COMMUNITY)
Admission: RE | Admit: 2014-04-09 | Discharge: 2014-04-09 | Disposition: A | Discharge status: Home / Self Care | Payer: Medicare Other | Source: Ambulatory Visit | Attending: Rheumatology | Admitting: Rheumatology

## 2014-04-09 DIAGNOSIS — M069 Rheumatoid arthritis, unspecified: Secondary | ICD-10-CM | POA: Insufficient documentation

## 2014-04-09 MED ORDER — SODIUM CHLORIDE 0.9 % IV SOLN
100.0000 mg | INTRAVENOUS | Status: AC
  Administered 2014-04-09: 100 mg via INTRAVENOUS
  Filled 2014-04-09: qty 8

## 2014-04-09 MED ORDER — SODIUM CHLORIDE 0.9 % IV SOLN
INTRAVENOUS | Status: AC
  Administered 2014-04-09: 12:00:00 via INTRAVENOUS

## 2014-04-09 MED ORDER — ZOLEDRONIC ACID 5 MG/100ML IV SOLN
5.0000 mg | Freq: Once | INTRAVENOUS | Status: AC
  Administered 2014-04-09: 5 mg via INTRAVENOUS
  Filled 2014-04-09: qty 100

## 2014-04-09 NOTE — Discharge Instructions (Signed)
Drink  Fluids/water as tolerated over the next 72 hours Tylenol or ibuprofen OTC as directed Continue Calcium and Vit D as directed by your MD  Zoledronic Acid injection (Paget's Disease, Osteoporosis) What is this medicine? ZOLEDRONIC ACID (ZOE le dron ik AS id) lowers the amount of calcium loss from bone. It is used to treat Paget's disease and osteoporosis in women. This medicine may be used for other purposes; ask your health care provider or pharmacist if you have questions. COMMON BRAND NAME(S): Reclast, Zometa What should I tell my health care provider before I take this medicine? They need to know if you have any of these conditions: -aspirin-sensitive asthma -cancer, especially if you are receiving medicines used to treat cancer -dental disease or wear dentures -infection -kidney disease -low levels of calcium in the blood -past surgery on the parathyroid gland or intestines -receiving corticosteroids like dexamethasone or prednisone -an unusual or allergic reaction to zoledronic acid, other medicines, foods, dyes, or preservatives -pregnant or trying to get pregnant -breast-feeding How should I use this medicine? This medicine is for infusion into a vein. It is given by a health care professional in a hospital or clinic setting. Talk to your pediatrician regarding the use of this medicine in children. This medicine is not approved for use in children. Overdosage: If you think you have taken too much of this medicine contact a poison control center or emergency room at once. NOTE: This medicine is only for you. Do not share this medicine with others. What if I miss a dose? It is important not to miss your dose. Call your doctor or health care professional if you are unable to keep an appointment. What may interact with this medicine? -certain antibiotics given by injection -NSAIDs, medicines for pain and inflammation, like ibuprofen or naproxen -some diuretics like  bumetanide, furosemide -teriparatide This list may not describe all possible interactions. Give your health care provider a list of all the medicines, herbs, non-prescription drugs, or dietary supplements you use. Also tell them if you smoke, drink alcohol, or use illegal drugs. Some items may interact with your medicine. What should I watch for while using this medicine? Visit your doctor or health care professional for regular checkups. It may be some time before you see the benefit from this medicine. Do not stop taking your medicine unless your doctor tells you to. Your doctor may order blood tests or other tests to see how you are doing. Women should inform their doctor if they wish to become pregnant or think they might be pregnant. There is a potential for serious side effects to an unborn child. Talk to your health care professional or pharmacist for more information. You should make sure that you get enough calcium and vitamin D while you are taking this medicine. Discuss the foods you eat and the vitamins you take with your health care professional. Some people who take this medicine have severe bone, joint, and/or muscle pain. This medicine may also increase your risk for jaw problems or a broken thigh bone. Tell your doctor right away if you have severe pain in your jaw, bones, joints, or muscles. Tell your doctor if you have any pain that does not go away or that gets worse. Tell your dentist and dental surgeon that you are taking this medicine. You should not have major dental surgery while on this medicine. See your dentist to have a dental exam and fix any dental problems before starting this medicine. Take good care  of your teeth while on this medicine. Make sure you see your dentist for regular follow-up appointments. What side effects may I notice from receiving this medicine? Side effects that you should report to your doctor or health care professional as soon as possible: -allergic  reactions like skin rash, itching or hives, swelling of the face, lips, or tongue -anxiety, confusion, or depression -breathing problems -changes in vision -eye pain -feeling faint or lightheaded, falls -jaw pain, especially after dental work -mouth sores -muscle cramps, stiffness, or weakness -trouble passing urine or change in the amount of urine Side effects that usually do not require medical attention (report to your doctor or health care professional if they continue or are bothersome): -bone, joint, or muscle pain -constipation -diarrhea -fever -hair loss -irritation at site where injected -loss of appetite -nausea, vomiting -stomach upset -trouble sleeping -trouble swallowing -weak or tired This list may not describe all possible side effects. Call your doctor for medical advice about side effects. You may report side effects to FDA at 1-800-FDA-1088. Where should I keep my medicine? This drug is given in a hospital or clinic and will not be stored at home. NOTE: This sheet is a summary. It may not cover all possible information. If you have questions about this medicine, talk to your doctor, pharmacist, or health care provider.  2015, Elsevier/Gold Standard. (2013-03-19 10:03:48)   Golimumab injection What is this medicine? GOLIMUMAB (goe LIM ue mab) is used to treat rheumatoid arthritis, psoriatic arthritis, ulcerative colitis, and ankylosing spondylitis. This medicine may be used for other purposes; ask your health care provider or pharmacist if you have questions. COMMON BRAND NAME(S): Simponi, SIMPONI ARIA What should I tell my health care provider before I take this medicine? They need to know if you have any of these conditions: -cancer -diabetes -heart disease -hepatitis B or history of hepatitis B infection -immune system problems -infection or history of infections -low blood counts like low white cell, platelet, or red cell counts -multiple  sclerosis -recently received or scheduled to receive a vaccine -scheduled to have surgery -tuberculosis, a positive skin test for tuberculosis or have recently been in close contact with someone who has tuberculosis -an unusual reaction to golimumab, other medicines, latex, rubber, foods, dyes, or preservatives -pregnant or trying to get pregnant -breast-feeding How should I use this medicine? This medicine is for injection under the skin. You will be taught how to prepare and give this medicine. Use exactly as directed. Take your medicine at regular intervals. Do not take your medicine more often than directed. More information is available by calling [phone]. It is important that you put your used needles and syringes in a special sharps container. Do not put them in a trash can. If you do not have a sharps container, call your pharmacist or healthcare provider to get one. A special MedGuide will be given to you by the pharmacist with each prescription and refill. Be sure to read this information carefully each time. Talk to your pediatrician regarding the use of this medicine in children. Special care may be needed. Overdosage: If you think you've taken too much of this medicine contact a poison control center or emergency room at once. Overdosage: If you think you have taken too much of this medicine contact a poison control center or emergency room at once. NOTE: This medicine is only for you. Do not share this medicine with others. What if I miss a dose? If you miss a dose, take it  as soon as you can. If it is almost time for your next dose, take only that dose. Do not take double or extra doses. Call your doctor or health care professional if you are not sure how to handle a missed dose. What may interact with this medicine? Do not take this medicine with any of the following medications: -abatacept -adalimumab -anakinra -certolizumab -etanercept -infliximab -live virus  vaccines -rilonacept This medicine may also interact with the following medications: -cyclosporine -rituximab -theophylline -vaccines -warfarin This list may not describe all possible interactions. Give your health care provider a list of all the medicines, herbs, non-prescription drugs, or dietary supplements you use. Also tell them if you smoke, drink alcohol, or use illegal drugs. Some items may interact with your medicine. What should I watch for while using this medicine? Visit your doctor or health care professional for regular checks on your progress. Tell your doctor or healthcare professional if your symptoms do not start to get better or if they get worse. You will be tested for tuberculosis (TB) before you start this medicine. If your doctor prescribes any medicine for TB, you should start taking the TB medicine before starting this medicine. Make sure to finish the full course of TB medicine. Call your doctor or health care professional if you get a cold or other infection while receiving this medicine. Do not treat yourself. This medicine may decrease your body's ability to fight infection. Talk to your doctor about your risk of cancer. You may be more at risk for certain types of cancers if you take this medicine. What side effects may I notice from receiving this medicine? Side effects that you should report to your doctor or health care professional as soon as possible: -allergic reactions like skin rash, itching or hives, swelling of the face, lips, or tongue -breathing problems -changes in vision -chest pain -dark urine -fever, chills, or any other sign of infection -light-colored stools -muscle pain or weakness -numbness or tingling -red, scaly patches or raised bumps on the skin -right upper belly pain -swelling of the ankles -swollen lymph nodes in the neck, underarm, or groin areas -unexplained weight loss -unusual bleeding or bruising -unusually weak or  tired -yellowing of the eyes or skin Side effects that usually do not require medical attention (report to your doctor or health care professional if they continue or are bothersome): -dizziness -nausea -redness, itching, swelling, or bruising at site where injected This list may not describe all possible side effects. Call your doctor for medical advice about side effects. You may report side effects to FDA at 1-800-FDA-1088. Where should I keep my medicine? Keep out of the reach of children. Store in the original container and in the refrigerator between 2 and 8 degrees C (36 and 46 degrees F). Do not freeze. Protect from light. Throw away any unused medicine after the expiration date. NOTE: This sheet is a summary. It may not cover all possible information. If you have questions about this medicine, talk to your doctor, pharmacist, or health care provider.  2015, Elsevier/Gold Standard. (2012-05-09 13:41:25)

## 2014-04-11 DIAGNOSIS — F322 Major depressive disorder, single episode, severe without psychotic features: Secondary | ICD-10-CM | POA: Diagnosis not present

## 2014-05-10 DIAGNOSIS — E785 Hyperlipidemia, unspecified: Secondary | ICD-10-CM | POA: Diagnosis not present

## 2014-05-10 DIAGNOSIS — Z79899 Other long term (current) drug therapy: Secondary | ICD-10-CM | POA: Diagnosis not present

## 2014-05-10 DIAGNOSIS — F3289 Other specified depressive episodes: Secondary | ICD-10-CM | POA: Diagnosis not present

## 2014-05-10 DIAGNOSIS — Z125 Encounter for screening for malignant neoplasm of prostate: Secondary | ICD-10-CM | POA: Diagnosis not present

## 2014-05-10 DIAGNOSIS — F329 Major depressive disorder, single episode, unspecified: Secondary | ICD-10-CM | POA: Diagnosis not present

## 2014-05-10 DIAGNOSIS — M899 Disorder of bone, unspecified: Secondary | ICD-10-CM | POA: Diagnosis not present

## 2014-05-14 ENCOUNTER — Other Ambulatory Visit: Payer: Self-pay | Admitting: Internal Medicine

## 2014-05-14 ENCOUNTER — Encounter: Payer: Self-pay | Admitting: Internal Medicine

## 2014-05-14 DIAGNOSIS — J8410 Pulmonary fibrosis, unspecified: Secondary | ICD-10-CM | POA: Diagnosis not present

## 2014-05-14 DIAGNOSIS — J449 Chronic obstructive pulmonary disease, unspecified: Secondary | ICD-10-CM | POA: Diagnosis not present

## 2014-05-14 DIAGNOSIS — F17200 Nicotine dependence, unspecified, uncomplicated: Secondary | ICD-10-CM

## 2014-05-14 DIAGNOSIS — M545 Low back pain, unspecified: Secondary | ICD-10-CM | POA: Diagnosis not present

## 2014-05-14 DIAGNOSIS — Z0000 Encounter for general adult medical examination without abnormal findings: Secondary | ICD-10-CM | POA: Diagnosis not present

## 2014-05-14 DIAGNOSIS — Z1331 Encounter for screening for depression: Secondary | ICD-10-CM | POA: Diagnosis not present

## 2014-05-14 DIAGNOSIS — F411 Generalized anxiety disorder: Secondary | ICD-10-CM | POA: Diagnosis not present

## 2014-05-14 DIAGNOSIS — M069 Rheumatoid arthritis, unspecified: Secondary | ICD-10-CM | POA: Diagnosis not present

## 2014-05-14 DIAGNOSIS — IMO0002 Reserved for concepts with insufficient information to code with codable children: Secondary | ICD-10-CM | POA: Diagnosis not present

## 2014-05-16 DIAGNOSIS — Z1212 Encounter for screening for malignant neoplasm of rectum: Secondary | ICD-10-CM | POA: Diagnosis not present

## 2014-05-20 ENCOUNTER — Ambulatory Visit
Admission: RE | Admit: 2014-05-20 | Discharge: 2014-05-20 | Disposition: A | Discharge status: Home / Self Care | Payer: Medicare Other | Source: Ambulatory Visit | Attending: Internal Medicine | Admitting: Internal Medicine

## 2014-05-20 DIAGNOSIS — F17200 Nicotine dependence, unspecified, uncomplicated: Secondary | ICD-10-CM | POA: Diagnosis not present

## 2014-05-20 DIAGNOSIS — J439 Emphysema, unspecified: Secondary | ICD-10-CM | POA: Diagnosis not present

## 2014-05-20 DIAGNOSIS — Z122 Encounter for screening for malignant neoplasm of respiratory organs: Secondary | ICD-10-CM | POA: Diagnosis not present

## 2014-05-20 DIAGNOSIS — Z87891 Personal history of nicotine dependence: Secondary | ICD-10-CM | POA: Diagnosis not present

## 2014-05-20 DIAGNOSIS — J438 Other emphysema: Secondary | ICD-10-CM | POA: Diagnosis not present

## 2014-05-21 ENCOUNTER — Other Ambulatory Visit: Payer: Self-pay | Admitting: Internal Medicine

## 2014-05-21 DIAGNOSIS — R911 Solitary pulmonary nodule: Secondary | ICD-10-CM

## 2014-05-30 DIAGNOSIS — F322 Major depressive disorder, single episode, severe without psychotic features: Secondary | ICD-10-CM | POA: Diagnosis not present

## 2014-06-06 DIAGNOSIS — F322 Major depressive disorder, single episode, severe without psychotic features: Secondary | ICD-10-CM | POA: Diagnosis not present

## 2014-06-11 ENCOUNTER — Encounter (HOSPITAL_COMMUNITY): Payer: Self-pay

## 2014-06-11 ENCOUNTER — Encounter (HOSPITAL_COMMUNITY)
Admission: RE | Admit: 2014-06-11 | Discharge: 2014-06-11 | Disposition: A | Discharge status: Home / Self Care | Payer: Medicare Other | Source: Ambulatory Visit | Attending: Rheumatology | Admitting: Rheumatology

## 2014-06-11 DIAGNOSIS — M069 Rheumatoid arthritis, unspecified: Secondary | ICD-10-CM | POA: Diagnosis not present

## 2014-06-11 MED ORDER — DIPHENHYDRAMINE HCL 50 MG/ML IJ SOLN: 25.0000 mg | INTRAMUSCULAR | Status: DC | PRN

## 2014-06-11 MED ORDER — SODIUM CHLORIDE 0.9 % IV SOLN
100.0000 mg | Freq: Once | INTRAVENOUS | Status: AC
  Administered 2014-06-11: 100 mg via INTRAVENOUS
  Filled 2014-06-11: qty 8

## 2014-06-11 MED ORDER — SODIUM CHLORIDE 0.9 % IV SOLN
INTRAVENOUS | Status: DC
  Administered 2014-06-11: 11:00:00 via INTRAVENOUS

## 2014-06-13 DIAGNOSIS — I2510 Atherosclerotic heart disease of native coronary artery without angina pectoris: Secondary | ICD-10-CM | POA: Diagnosis not present

## 2014-06-13 DIAGNOSIS — J438 Other emphysema: Secondary | ICD-10-CM | POA: Diagnosis not present

## 2014-06-13 DIAGNOSIS — R0602 Shortness of breath: Secondary | ICD-10-CM | POA: Diagnosis not present

## 2014-06-13 DIAGNOSIS — I10 Essential (primary) hypertension: Secondary | ICD-10-CM | POA: Diagnosis not present

## 2014-06-17 DIAGNOSIS — I2510 Atherosclerotic heart disease of native coronary artery without angina pectoris: Secondary | ICD-10-CM | POA: Diagnosis not present

## 2014-06-17 DIAGNOSIS — I10 Essential (primary) hypertension: Secondary | ICD-10-CM | POA: Diagnosis not present

## 2014-06-17 DIAGNOSIS — R0602 Shortness of breath: Secondary | ICD-10-CM | POA: Diagnosis not present

## 2014-06-19 DIAGNOSIS — Z79899 Other long term (current) drug therapy: Secondary | ICD-10-CM | POA: Diagnosis not present

## 2014-06-19 DIAGNOSIS — M069 Rheumatoid arthritis, unspecified: Secondary | ICD-10-CM | POA: Diagnosis not present

## 2014-06-19 DIAGNOSIS — F17200 Nicotine dependence, unspecified, uncomplicated: Secondary | ICD-10-CM | POA: Diagnosis not present

## 2014-06-19 DIAGNOSIS — M4800 Spinal stenosis, site unspecified: Secondary | ICD-10-CM | POA: Diagnosis not present

## 2014-06-26 DIAGNOSIS — I10 Essential (primary) hypertension: Secondary | ICD-10-CM | POA: Diagnosis not present

## 2014-07-03 ENCOUNTER — Ambulatory Visit (AMBULATORY_SURGERY_CENTER): Payer: Self-pay | Admitting: *Deleted

## 2014-07-03 VITALS — Ht 70.0 in | Wt 155.0 lb

## 2014-07-03 DIAGNOSIS — Z1211 Encounter for screening for malignant neoplasm of colon: Secondary | ICD-10-CM

## 2014-07-03 MED ORDER — MOVIPREP 100 G PO SOLR: 1.0000 | Freq: Once | ORAL | Status: DC

## 2014-07-03 NOTE — Progress Notes (Signed)
No egg or soy allergy. ewm No problems with past sedation. ewm No home 02 use or cpap use. ewm Pt declined emmi colon video. ewm

## 2014-07-04 ENCOUNTER — Encounter: Payer: Self-pay | Admitting: Internal Medicine

## 2014-07-04 DIAGNOSIS — I10 Essential (primary) hypertension: Secondary | ICD-10-CM | POA: Diagnosis not present

## 2014-07-04 DIAGNOSIS — I2510 Atherosclerotic heart disease of native coronary artery without angina pectoris: Secondary | ICD-10-CM | POA: Diagnosis not present

## 2014-07-04 DIAGNOSIS — R0602 Shortness of breath: Secondary | ICD-10-CM | POA: Diagnosis not present

## 2014-07-04 DIAGNOSIS — F322 Major depressive disorder, single episode, severe without psychotic features: Secondary | ICD-10-CM | POA: Diagnosis not present

## 2014-07-05 DIAGNOSIS — M47817 Spondylosis without myelopathy or radiculopathy, lumbosacral region: Secondary | ICD-10-CM | POA: Diagnosis not present

## 2014-07-05 DIAGNOSIS — M545 Low back pain, unspecified: Secondary | ICD-10-CM | POA: Diagnosis not present

## 2014-07-17 ENCOUNTER — Ambulatory Visit (AMBULATORY_SURGERY_CENTER): Payer: Medicare Other | Admitting: Internal Medicine

## 2014-07-17 ENCOUNTER — Encounter: Payer: Self-pay | Admitting: Internal Medicine

## 2014-07-17 VITALS — BP 128/71 | HR 60 | Temp 98.0°F | Resp 16 | Ht 70.0 in | Wt 155.0 lb

## 2014-07-17 DIAGNOSIS — J449 Chronic obstructive pulmonary disease, unspecified: Secondary | ICD-10-CM | POA: Diagnosis not present

## 2014-07-17 DIAGNOSIS — Z1211 Encounter for screening for malignant neoplasm of colon: Secondary | ICD-10-CM | POA: Diagnosis not present

## 2014-07-17 DIAGNOSIS — D126 Benign neoplasm of colon, unspecified: Secondary | ICD-10-CM

## 2014-07-17 MED ORDER — SODIUM CHLORIDE 0.9 % IV SOLN: 500.0000 mL | INTRAVENOUS | Status: DC

## 2014-07-17 NOTE — Op Note (Signed)
[place]  [address] [place], [id number]   COLONOSCOPY PROCEDURE REPORT  PATIENT: Kenneth [name], Kenneth [name]  MR#: [id number] BIRTHDATE: [birth year] , 72  yrs. old GENDER: male ENDOSCOPIST: Lafayette Dragon, MD REFERRED MW:UXLK Perini, M.D. PROCEDURE DATE:  07/17/2014 PROCEDURE:   Colonoscopy with snare polypectomy First Screening Colonoscopy - Avg.  risk and is 50 yrs.  old or older - No.  Prior Negative Screening - Now for repeat screening. 10 or more years since last screening  History of Adenoma - Now for follow-up colonoscopy & has been > or = to 3 yrs.  N/A  Polyps Removed Today? Yes. ASA CLASS:   Class II INDICATIONS:average risk for colon cancer and prior to colonoscopy August 2004 was normal. MEDICATIONS: Monitored anesthesia care and Propofol 200 mg IV  DESCRIPTION OF PROCEDURE:   After the risks benefits and alternatives of the procedure were thoroughly explained, informed consent was obtained.  The digital rectal exam revealed no abnormalities of the rectum.   The [id number]  endoscope was introduced through the anus and advanced to the cecum, which was identified by both the appendix and ileocecal valve. No adverse events experienced.   The quality of the prep was good, using MoviPrep  The instrument was then slowly withdrawn as the colon was fully examined.      COLON FINDINGS: Two firm sessile polyps measuring 10 mm in size were found at the cecum and in the ascending colon.  A polypectomy was performed with a cold snare.  The resection was complete, the polyp tissue was completely retrieved and sent to histology.  Retroflexed views revealed no abnormalities. The time to cecum=14 minutes 08 seconds.  Withdrawal time=9 minutes 05 seconds.  The scope was withdrawn and the procedure completed. COMPLICATIONS: There were no immediate complications.  ENDOSCOPIC IMPRESSION: Two sessile polyps were found at the cecum and in the ascending colon;  polypectomy was performed with a cold snare  RECOMMENDATIONS: 1.  Await pathology results 2.  High fiber diet Recall colonoscopy pending path report  eSigned:  Lafayette Dragon, MD 07/17/2014 11:25 AM   cc:   PATIENT NAME:  [name], [name] MR#: [id number]

## 2014-07-17 NOTE — Progress Notes (Signed)
Called to room to assist during endoscopic procedure.  Patient ID and intended procedure confirmed with present staff. Received instructions for my participation in the procedure from the performing physician.  

## 2014-07-17 NOTE — Progress Notes (Signed)
Waiting for Dr. Maurene Capes

## 2014-07-17 NOTE — Progress Notes (Signed)
A/ox3, pleased with MAC, report to RN 

## 2014-07-17 NOTE — Patient Instructions (Signed)

## 2014-07-18 ENCOUNTER — Telehealth: Payer: Self-pay | Admitting: *Deleted

## 2014-07-18 NOTE — Telephone Encounter (Signed)
  Follow up Call-  Call back number 07/17/2014  Post procedure Call Back phone  # [phone]  Permission to leave phone message Yes     Patient questions:  Do you have a fever, pain , or abdominal swelling? No. Pain Score  0 *  Have you tolerated food without any problems? Yes.    Have you been able to return to your normal activities? Yes.    Do you have any questions about your discharge instructions: Diet   No. Medications  No. Follow up visit  No.  Do you have questions or concerns about your Care? No.  Actions: * If pain score is 4 or above: No action needed, pain <4.

## 2014-07-22 ENCOUNTER — Encounter: Payer: Self-pay | Admitting: Internal Medicine

## 2014-07-22 DIAGNOSIS — R0602 Shortness of breath: Secondary | ICD-10-CM | POA: Diagnosis not present

## 2014-07-22 DIAGNOSIS — I10 Essential (primary) hypertension: Secondary | ICD-10-CM | POA: Diagnosis not present

## 2014-07-22 DIAGNOSIS — J432 Centrilobular emphysema: Secondary | ICD-10-CM | POA: Diagnosis not present

## 2014-07-22 DIAGNOSIS — I2510 Atherosclerotic heart disease of native coronary artery without angina pectoris: Secondary | ICD-10-CM | POA: Diagnosis not present

## 2014-07-23 DIAGNOSIS — M545 Low back pain: Secondary | ICD-10-CM | POA: Diagnosis not present

## 2014-07-23 DIAGNOSIS — M5126 Other intervertebral disc displacement, lumbar region: Secondary | ICD-10-CM | POA: Diagnosis not present

## 2014-07-23 DIAGNOSIS — M47819 Spondylosis without myelopathy or radiculopathy, site unspecified: Secondary | ICD-10-CM | POA: Diagnosis not present

## 2014-07-30 DIAGNOSIS — R05 Cough: Secondary | ICD-10-CM | POA: Diagnosis not present

## 2014-07-30 DIAGNOSIS — J309 Allergic rhinitis, unspecified: Secondary | ICD-10-CM | POA: Diagnosis not present

## 2014-07-30 DIAGNOSIS — M069 Rheumatoid arthritis, unspecified: Secondary | ICD-10-CM | POA: Diagnosis not present

## 2014-07-30 DIAGNOSIS — J449 Chronic obstructive pulmonary disease, unspecified: Secondary | ICD-10-CM | POA: Diagnosis not present

## 2014-07-30 DIAGNOSIS — Z79899 Other long term (current) drug therapy: Secondary | ICD-10-CM | POA: Diagnosis not present

## 2014-07-30 DIAGNOSIS — R5383 Other fatigue: Secondary | ICD-10-CM | POA: Diagnosis not present

## 2014-07-30 DIAGNOSIS — M545 Low back pain: Secondary | ICD-10-CM | POA: Diagnosis not present

## 2014-07-30 DIAGNOSIS — R918 Other nonspecific abnormal finding of lung field: Secondary | ICD-10-CM | POA: Diagnosis not present

## 2014-07-30 DIAGNOSIS — F329 Major depressive disorder, single episode, unspecified: Secondary | ICD-10-CM | POA: Diagnosis not present

## 2014-08-02 DIAGNOSIS — M545 Low back pain: Secondary | ICD-10-CM | POA: Diagnosis not present

## 2014-08-02 DIAGNOSIS — M47816 Spondylosis without myelopathy or radiculopathy, lumbar region: Secondary | ICD-10-CM | POA: Diagnosis not present

## 2014-08-12 ENCOUNTER — Ambulatory Visit
Admission: RE | Admit: 2014-08-12 | Discharge: 2014-08-12 | Disposition: A | Discharge status: Home / Self Care | Payer: Medicare Other | Source: Ambulatory Visit | Attending: Internal Medicine | Admitting: Internal Medicine

## 2014-08-12 DIAGNOSIS — Z122 Encounter for screening for malignant neoplasm of respiratory organs: Secondary | ICD-10-CM | POA: Diagnosis not present

## 2014-08-12 DIAGNOSIS — Z7952 Long term (current) use of systemic steroids: Secondary | ICD-10-CM | POA: Diagnosis not present

## 2014-08-12 DIAGNOSIS — Z87891 Personal history of nicotine dependence: Secondary | ICD-10-CM | POA: Diagnosis not present

## 2014-08-12 DIAGNOSIS — R911 Solitary pulmonary nodule: Secondary | ICD-10-CM

## 2014-08-12 DIAGNOSIS — M545 Low back pain: Secondary | ICD-10-CM | POA: Diagnosis not present

## 2014-08-12 DIAGNOSIS — M069 Rheumatoid arthritis, unspecified: Secondary | ICD-10-CM | POA: Diagnosis not present

## 2014-08-13 ENCOUNTER — Encounter (HOSPITAL_COMMUNITY): Admission: RE | Admit: 2014-08-13 | Payer: Medicare Other | Source: Ambulatory Visit

## 2014-08-14 DIAGNOSIS — F322 Major depressive disorder, single episode, severe without psychotic features: Secondary | ICD-10-CM | POA: Diagnosis not present

## 2014-08-26 DIAGNOSIS — F17200 Nicotine dependence, unspecified, uncomplicated: Secondary | ICD-10-CM | POA: Diagnosis not present

## 2014-08-26 DIAGNOSIS — M5380 Other specified dorsopathies, site unspecified: Secondary | ICD-10-CM | POA: Diagnosis not present

## 2014-08-26 DIAGNOSIS — J449 Chronic obstructive pulmonary disease, unspecified: Secondary | ICD-10-CM | POA: Diagnosis not present

## 2014-08-26 DIAGNOSIS — F419 Anxiety disorder, unspecified: Secondary | ICD-10-CM | POA: Diagnosis not present

## 2014-08-26 DIAGNOSIS — F329 Major depressive disorder, single episode, unspecified: Secondary | ICD-10-CM | POA: Diagnosis not present

## 2014-08-26 DIAGNOSIS — Z23 Encounter for immunization: Secondary | ICD-10-CM | POA: Diagnosis not present

## 2014-08-26 DIAGNOSIS — Z6821 Body mass index (BMI) 21.0-21.9, adult: Secondary | ICD-10-CM | POA: Diagnosis not present

## 2014-08-27 DIAGNOSIS — F322 Major depressive disorder, single episode, severe without psychotic features: Secondary | ICD-10-CM | POA: Diagnosis not present

## 2014-09-04 ENCOUNTER — Encounter (HOSPITAL_COMMUNITY)
Admission: RE | Admit: 2014-09-04 | Discharge: 2014-09-04 | Disposition: A | Discharge status: Home / Self Care | Payer: Medicare Other | Source: Ambulatory Visit | Attending: Rheumatology | Admitting: Rheumatology

## 2014-09-04 ENCOUNTER — Encounter (HOSPITAL_COMMUNITY): Payer: Self-pay

## 2014-09-04 DIAGNOSIS — F322 Major depressive disorder, single episode, severe without psychotic features: Secondary | ICD-10-CM | POA: Diagnosis not present

## 2014-09-04 DIAGNOSIS — M069 Rheumatoid arthritis, unspecified: Secondary | ICD-10-CM | POA: Diagnosis not present

## 2014-09-04 MED ORDER — SODIUM CHLORIDE 0.9 % IV SOLN
100.0000 mg | INTRAVENOUS | Status: DC
  Administered 2014-09-04: 100 mg via INTRAVENOUS
  Filled 2014-09-04: qty 8

## 2014-09-04 MED ORDER — SODIUM CHLORIDE 0.9 % IV SOLN
INTRAVENOUS | Status: AC
  Administered 2014-09-04: 10:00:00 via INTRAVENOUS

## 2014-09-18 DIAGNOSIS — F322 Major depressive disorder, single episode, severe without psychotic features: Secondary | ICD-10-CM | POA: Diagnosis not present

## 2014-10-02 DIAGNOSIS — F322 Major depressive disorder, single episode, severe without psychotic features: Secondary | ICD-10-CM | POA: Diagnosis not present

## 2014-10-03 DIAGNOSIS — F322 Major depressive disorder, single episode, severe without psychotic features: Secondary | ICD-10-CM | POA: Diagnosis not present

## 2014-10-08 ENCOUNTER — Encounter (HOSPITAL_COMMUNITY): Payer: Medicare Other

## 2014-10-31 DIAGNOSIS — F322 Major depressive disorder, single episode, severe without psychotic features: Secondary | ICD-10-CM | POA: Diagnosis not present

## 2014-11-07 ENCOUNTER — Encounter (HOSPITAL_COMMUNITY)
Admission: RE | Admit: 2014-11-07 | Discharge: 2014-11-07 | Disposition: A | Discharge status: Home / Self Care | Payer: Medicare Other | Source: Ambulatory Visit | Attending: Rheumatology | Admitting: Rheumatology

## 2014-11-07 DIAGNOSIS — M069 Rheumatoid arthritis, unspecified: Secondary | ICD-10-CM | POA: Insufficient documentation

## 2014-11-19 DIAGNOSIS — M545 Low back pain: Secondary | ICD-10-CM | POA: Diagnosis not present

## 2014-11-19 DIAGNOSIS — Z7952 Long term (current) use of systemic steroids: Secondary | ICD-10-CM | POA: Diagnosis not present

## 2014-11-19 DIAGNOSIS — R799 Abnormal finding of blood chemistry, unspecified: Secondary | ICD-10-CM | POA: Diagnosis not present

## 2014-11-19 DIAGNOSIS — M069 Rheumatoid arthritis, unspecified: Secondary | ICD-10-CM | POA: Diagnosis not present

## 2014-11-28 DIAGNOSIS — F322 Major depressive disorder, single episode, severe without psychotic features: Secondary | ICD-10-CM | POA: Diagnosis not present

## 2014-12-02 ENCOUNTER — Encounter (HOSPITAL_COMMUNITY): Admission: RE | Admit: 2014-12-02 | Payer: Medicare Other | Source: Ambulatory Visit

## 2014-12-09 MED ORDER — SODIUM CHLORIDE 0.9 % IV SOLN: 100.0000 mg | INTRAVENOUS | Status: DC

## 2014-12-30 DIAGNOSIS — Z6822 Body mass index (BMI) 22.0-22.9, adult: Secondary | ICD-10-CM | POA: Diagnosis not present

## 2014-12-30 DIAGNOSIS — F419 Anxiety disorder, unspecified: Secondary | ICD-10-CM | POA: Diagnosis not present

## 2014-12-30 DIAGNOSIS — F329 Major depressive disorder, single episode, unspecified: Secondary | ICD-10-CM | POA: Diagnosis not present

## 2014-12-30 DIAGNOSIS — M069 Rheumatoid arthritis, unspecified: Secondary | ICD-10-CM | POA: Diagnosis not present

## 2014-12-30 DIAGNOSIS — J449 Chronic obstructive pulmonary disease, unspecified: Secondary | ICD-10-CM | POA: Diagnosis not present

## 2014-12-30 DIAGNOSIS — M5380 Other specified dorsopathies, site unspecified: Secondary | ICD-10-CM | POA: Diagnosis not present

## 2014-12-30 DIAGNOSIS — F17200 Nicotine dependence, unspecified, uncomplicated: Secondary | ICD-10-CM | POA: Diagnosis not present

## 2014-12-30 DIAGNOSIS — J8410 Pulmonary fibrosis, unspecified: Secondary | ICD-10-CM | POA: Diagnosis not present

## 2015-01-01 ENCOUNTER — Encounter (HOSPITAL_COMMUNITY): Payer: Medicare Other

## 2015-01-01 DIAGNOSIS — F322 Major depressive disorder, single episode, severe without psychotic features: Secondary | ICD-10-CM | POA: Diagnosis not present

## 2015-01-02 ENCOUNTER — Encounter (HOSPITAL_COMMUNITY): Payer: Self-pay

## 2015-01-02 ENCOUNTER — Encounter (HOSPITAL_COMMUNITY)
Admission: RE | Admit: 2015-01-02 | Discharge: 2015-01-02 | Disposition: A | Discharge status: Home / Self Care | Payer: Medicare Other | Source: Ambulatory Visit | Attending: Rheumatology | Admitting: Rheumatology

## 2015-01-02 DIAGNOSIS — M069 Rheumatoid arthritis, unspecified: Secondary | ICD-10-CM | POA: Insufficient documentation

## 2015-01-02 MED ORDER — SODIUM CHLORIDE 0.9 % IV SOLN
100.0000 mg | INTRAVENOUS | Status: DC
  Administered 2015-01-02: 100 mg via INTRAVENOUS
  Filled 2015-01-02: qty 8

## 2015-01-02 MED ORDER — SODIUM CHLORIDE 0.9 % IV SOLN
INTRAVENOUS | Status: DC
  Administered 2015-01-02: 250 mL via INTRAVENOUS

## 2015-01-02 NOTE — Progress Notes (Signed)
Uneventful infusion of Simponi Aria. Pt discharged ambulatory accompanied by wife to back door of short stay

## 2015-01-09 DIAGNOSIS — F322 Major depressive disorder, single episode, severe without psychotic features: Secondary | ICD-10-CM | POA: Diagnosis not present

## 2015-01-24 DIAGNOSIS — M47816 Spondylosis without myelopathy or radiculopathy, lumbar region: Secondary | ICD-10-CM | POA: Diagnosis not present

## 2015-02-03 ENCOUNTER — Encounter (HOSPITAL_COMMUNITY): Admission: RE | Admit: 2015-02-03 | Payer: Medicare Other | Source: Ambulatory Visit

## 2015-02-17 DIAGNOSIS — Z7952 Long term (current) use of systemic steroids: Secondary | ICD-10-CM | POA: Diagnosis not present

## 2015-02-17 DIAGNOSIS — M069 Rheumatoid arthritis, unspecified: Secondary | ICD-10-CM | POA: Diagnosis not present

## 2015-03-06 ENCOUNTER — Encounter (HOSPITAL_COMMUNITY): Payer: Medicare Other

## 2015-03-06 ENCOUNTER — Encounter (HOSPITAL_COMMUNITY)
Admission: RE | Admit: 2015-03-06 | Discharge: 2015-03-06 | Disposition: A | Discharge status: Home / Self Care | Payer: Medicare Other | Source: Ambulatory Visit | Attending: Rheumatology | Admitting: Rheumatology

## 2015-03-06 DIAGNOSIS — M069 Rheumatoid arthritis, unspecified: Secondary | ICD-10-CM | POA: Insufficient documentation

## 2015-03-31 ENCOUNTER — Encounter (HOSPITAL_COMMUNITY): Payer: Self-pay

## 2015-03-31 ENCOUNTER — Encounter (HOSPITAL_COMMUNITY)
Admission: RE | Admit: 2015-03-31 | Discharge: 2015-03-31 | Disposition: A | Discharge status: Home / Self Care | Payer: Medicare Other | Source: Ambulatory Visit | Attending: Rheumatology | Admitting: Rheumatology

## 2015-03-31 DIAGNOSIS — M069 Rheumatoid arthritis, unspecified: Secondary | ICD-10-CM | POA: Diagnosis not present

## 2015-03-31 MED ORDER — SODIUM CHLORIDE 0.9 % IV SOLN
100.0000 mg | INTRAVENOUS | Status: DC
  Administered 2015-03-31: 100 mg via INTRAVENOUS
  Filled 2015-03-31: qty 8

## 2015-03-31 MED ORDER — SODIUM CHLORIDE 0.9 % IV SOLN
INTRAVENOUS | Status: DC
  Administered 2015-03-31: 14:00:00 via INTRAVENOUS

## 2015-03-31 MED ORDER — DIPHENHYDRAMINE HCL 50 MG/ML IJ SOLN: 25.0000 mg | Freq: Once | INTRAMUSCULAR | Status: AC | PRN

## 2015-03-31 NOTE — Discharge Instructions (Signed)
Golimumab injection What is this medicine? GOLIMUMAB (goe LIM ue mab) is used to treat rheumatoid arthritis, psoriatic arthritis, ulcerative colitis, and ankylosing spondylitis. This medicine may be used for other purposes; ask your health care provider or pharmacist if you have questions. COMMON BRAND NAME(S): Simponi, SIMPONI ARIA What should I tell my health care provider before I take this medicine? They need to know if you have any of these conditions: -cancer -diabetes -heart disease -hepatitis B or history of hepatitis B infection -immune system problems -infection or history of infections -low blood counts like low white cell, platelet, or red cell counts -multiple sclerosis -recently received or scheduled to receive a vaccine -scheduled to have surgery -tuberculosis, a positive skin test for tuberculosis or have recently been in close contact with someone who has tuberculosis -an unusual reaction to golimumab, other medicines, latex, rubber, foods, dyes, or preservatives -pregnant or trying to get pregnant -breast-feeding How should I use this medicine? This medicine is for injection under the skin. You will be taught how to prepare and give this medicine. Use exactly as directed. Take your medicine at regular intervals. Do not take your medicine more often than directed. More information is available by calling [phone]. It is important that you put your used needles and syringes in a special sharps container. Do not put them in a trash can. If you do not have a sharps container, call your pharmacist or healthcare provider to get one. A special MedGuide will be given to you by the pharmacist with each prescription and refill. Be sure to read this information carefully each time. Talk to your pediatrician regarding the use of this medicine in children. Special care may be needed. Overdosage: If you think you've taken too much of this medicine contact a poison control center  or emergency room at once. Overdosage: If you think you have taken too much of this medicine contact a poison control center or emergency room at once. NOTE: This medicine is only for you. Do not share this medicine with others. What if I miss a dose? If you miss a dose, take it as soon as you can. If it is almost time for your next dose, take only that dose. Do not take double or extra doses. Call your doctor or health care professional if you are not sure how to handle a missed dose. What may interact with this medicine? Do not take this medicine with any of the following medications: -abatacept -adalimumab -anakinra -certolizumab -etanercept -infliximab -live virus vaccines -rilonacept This medicine may also interact with the following medications: -cyclosporine -rituximab -theophylline -vaccines -warfarin This list may not describe all possible interactions. Give your health care provider a list of all the medicines, herbs, non-prescription drugs, or dietary supplements you use. Also tell them if you smoke, drink alcohol, or use illegal drugs. Some items may interact with your medicine. What should I watch for while using this medicine? Visit your doctor or health care professional for regular checks on your progress. Tell your doctor or healthcare professional if your symptoms do not start to get better or if they get worse. You will be tested for tuberculosis (TB) before you start this medicine. If your doctor prescribes any medicine for TB, you should start taking the TB medicine before starting this medicine. Make sure to finish the full course of TB medicine. Call your doctor or health care professional if you get a cold or other infection while receiving this medicine. Do not treat yourself. This  medicine may decrease your body's ability to fight infection. Talk to your doctor about your risk of cancer. You may be more at risk for certain types of cancers if you take this  medicine. What side effects may I notice from receiving this medicine? Side effects that you should report to your doctor or health care professional as soon as possible: -allergic reactions like skin rash, itching or hives, swelling of the face, lips, or tongue -breathing problems -changes in vision -chest pain -dark urine -fever, chills, or any other sign of infection -light-colored stools -muscle pain or weakness -numbness or tingling -red, scaly patches or raised bumps on the skin -right upper belly pain -swelling of the ankles -swollen lymph nodes in the neck, underarm, or groin areas -unexplained weight loss -unusual bleeding or bruising -unusually weak or tired -yellowing of the eyes or skin Side effects that usually do not require medical attention (report to your doctor or health care professional if they continue or are bothersome): -dizziness -nausea -redness, itching, swelling, or bruising at site where injected This list may not describe all possible side effects. Call your doctor for medical advice about side effects. You may report side effects to FDA at 1-800-FDA-1088. Where should I keep my medicine? Keep out of the reach of children. Store in the original container and in the refrigerator between 2 and 8 degrees C (36 and 46 degrees F). Do not freeze. Protect from light. Throw away any unused medicine after the expiration date. NOTE: This sheet is a summary. It may not cover all possible information. If you have questions about this medicine, talk to your doctor, pharmacist, or health care provider.  2015, [employer]. (2012-05-09 13:41:25)

## 2015-04-02 DIAGNOSIS — F322 Major depressive disorder, single episode, severe without psychotic features: Secondary | ICD-10-CM | POA: Diagnosis not present

## 2015-04-07 ENCOUNTER — Encounter (HOSPITAL_COMMUNITY): Payer: Medicare Other

## 2015-04-10 DIAGNOSIS — F322 Major depressive disorder, single episode, severe without psychotic features: Secondary | ICD-10-CM | POA: Diagnosis not present

## 2015-04-22 DIAGNOSIS — M545 Low back pain: Secondary | ICD-10-CM | POA: Diagnosis not present

## 2015-04-22 DIAGNOSIS — F17200 Nicotine dependence, unspecified, uncomplicated: Secondary | ICD-10-CM | POA: Diagnosis not present

## 2015-04-22 DIAGNOSIS — M0570 Rheumatoid arthritis with rheumatoid factor of unspecified site without organ or systems involvement: Secondary | ICD-10-CM | POA: Diagnosis not present

## 2015-04-28 ENCOUNTER — Encounter (HOSPITAL_COMMUNITY): Payer: Medicare Other

## 2015-05-08 ENCOUNTER — Encounter (HOSPITAL_COMMUNITY): Payer: Medicare Other

## 2015-05-19 DIAGNOSIS — Z79899 Other long term (current) drug therapy: Secondary | ICD-10-CM | POA: Diagnosis not present

## 2015-05-19 DIAGNOSIS — M069 Rheumatoid arthritis, unspecified: Secondary | ICD-10-CM | POA: Diagnosis not present

## 2015-05-26 ENCOUNTER — Encounter (HOSPITAL_COMMUNITY)
Admission: RE | Admit: 2015-05-26 | Discharge: 2015-05-26 | Disposition: A | Discharge status: Home / Self Care | Payer: Medicare Other | Source: Ambulatory Visit | Attending: Rheumatology | Admitting: Rheumatology

## 2015-05-26 DIAGNOSIS — M069 Rheumatoid arthritis, unspecified: Secondary | ICD-10-CM | POA: Insufficient documentation

## 2015-05-26 MED ORDER — SODIUM CHLORIDE 0.9 % IV SOLN
100.0000 mg | INTRAVENOUS | Status: AC
  Administered 2015-05-26: 100 mg via INTRAVENOUS
  Filled 2015-05-26: qty 8

## 2015-05-26 MED ORDER — SODIUM CHLORIDE 0.9 % IV SOLN
INTRAVENOUS | Status: AC
  Administered 2015-05-26: 250 mL via INTRAVENOUS

## 2015-05-29 DIAGNOSIS — F322 Major depressive disorder, single episode, severe without psychotic features: Secondary | ICD-10-CM | POA: Diagnosis not present

## 2015-06-02 ENCOUNTER — Encounter (HOSPITAL_COMMUNITY): Payer: Medicare Other

## 2015-06-04 DIAGNOSIS — E785 Hyperlipidemia, unspecified: Secondary | ICD-10-CM | POA: Diagnosis not present

## 2015-06-04 DIAGNOSIS — Z125 Encounter for screening for malignant neoplasm of prostate: Secondary | ICD-10-CM | POA: Diagnosis not present

## 2015-06-04 DIAGNOSIS — M859 Disorder of bone density and structure, unspecified: Secondary | ICD-10-CM | POA: Diagnosis not present

## 2015-06-11 DIAGNOSIS — M545 Low back pain: Secondary | ICD-10-CM | POA: Diagnosis not present

## 2015-06-11 DIAGNOSIS — Z0000 Encounter for general adult medical examination without abnormal findings: Secondary | ICD-10-CM | POA: Diagnosis not present

## 2015-06-11 DIAGNOSIS — G629 Polyneuropathy, unspecified: Secondary | ICD-10-CM | POA: Diagnosis not present

## 2015-06-11 DIAGNOSIS — R918 Other nonspecific abnormal finding of lung field: Secondary | ICD-10-CM | POA: Diagnosis not present

## 2015-06-11 DIAGNOSIS — M069 Rheumatoid arthritis, unspecified: Secondary | ICD-10-CM | POA: Diagnosis not present

## 2015-06-11 DIAGNOSIS — K5900 Constipation, unspecified: Secondary | ICD-10-CM | POA: Diagnosis not present

## 2015-06-11 DIAGNOSIS — F329 Major depressive disorder, single episode, unspecified: Secondary | ICD-10-CM | POA: Diagnosis not present

## 2015-06-11 DIAGNOSIS — Z6822 Body mass index (BMI) 22.0-22.9, adult: Secondary | ICD-10-CM | POA: Diagnosis not present

## 2015-06-11 DIAGNOSIS — R5383 Other fatigue: Secondary | ICD-10-CM | POA: Diagnosis not present

## 2015-06-11 DIAGNOSIS — J8410 Pulmonary fibrosis, unspecified: Secondary | ICD-10-CM | POA: Diagnosis not present

## 2015-06-11 DIAGNOSIS — J449 Chronic obstructive pulmonary disease, unspecified: Secondary | ICD-10-CM | POA: Diagnosis not present

## 2015-06-11 DIAGNOSIS — Z1389 Encounter for screening for other disorder: Secondary | ICD-10-CM | POA: Diagnosis not present

## 2015-06-16 DIAGNOSIS — Z1212 Encounter for screening for malignant neoplasm of rectum: Secondary | ICD-10-CM | POA: Diagnosis not present

## 2015-06-24 DIAGNOSIS — F322 Major depressive disorder, single episode, severe without psychotic features: Secondary | ICD-10-CM | POA: Diagnosis not present

## 2015-06-25 DIAGNOSIS — M0570 Rheumatoid arthritis with rheumatoid factor of unspecified site without organ or systems involvement: Secondary | ICD-10-CM | POA: Diagnosis not present

## 2015-06-25 DIAGNOSIS — Z79899 Other long term (current) drug therapy: Secondary | ICD-10-CM | POA: Diagnosis not present

## 2015-06-25 DIAGNOSIS — M545 Low back pain: Secondary | ICD-10-CM | POA: Diagnosis not present

## 2015-06-26 DIAGNOSIS — F322 Major depressive disorder, single episode, severe without psychotic features: Secondary | ICD-10-CM | POA: Diagnosis not present

## 2015-07-03 ENCOUNTER — Encounter (HOSPITAL_COMMUNITY): Payer: Medicare Other

## 2015-07-10 ENCOUNTER — Other Ambulatory Visit: Payer: Self-pay | Admitting: Internal Medicine

## 2015-07-10 DIAGNOSIS — R911 Solitary pulmonary nodule: Secondary | ICD-10-CM

## 2015-07-21 ENCOUNTER — Encounter (HOSPITAL_COMMUNITY)
Admission: RE | Admit: 2015-07-21 | Discharge: 2015-07-21 | Disposition: A | Discharge status: Home / Self Care | Payer: Medicare Other | Source: Ambulatory Visit | Attending: Rheumatology | Admitting: Rheumatology

## 2015-07-21 DIAGNOSIS — M069 Rheumatoid arthritis, unspecified: Secondary | ICD-10-CM | POA: Insufficient documentation

## 2015-07-24 DIAGNOSIS — F322 Major depressive disorder, single episode, severe without psychotic features: Secondary | ICD-10-CM | POA: Diagnosis not present

## 2015-07-28 DIAGNOSIS — Z23 Encounter for immunization: Secondary | ICD-10-CM | POA: Diagnosis not present

## 2015-07-31 ENCOUNTER — Encounter (HOSPITAL_COMMUNITY)
Admission: RE | Admit: 2015-07-31 | Discharge: 2015-07-31 | Disposition: A | Discharge status: Home / Self Care | Payer: Medicare Other | Source: Ambulatory Visit | Attending: Internal Medicine | Admitting: Internal Medicine

## 2015-07-31 DIAGNOSIS — M069 Rheumatoid arthritis, unspecified: Secondary | ICD-10-CM | POA: Diagnosis not present

## 2015-07-31 MED ORDER — SODIUM CHLORIDE 0.9 % IV SOLN
INTRAVENOUS | Status: AC
  Administered 2015-07-31: 250 mL via INTRAVENOUS

## 2015-07-31 MED ORDER — SODIUM CHLORIDE 0.9 % IV SOLN
100.0000 mg | INTRAVENOUS | Status: AC
  Administered 2015-07-31: 100 mg via INTRAVENOUS
  Filled 2015-07-31: qty 8

## 2015-07-31 NOTE — Discharge Instructions (Signed)
Golimumab injection What is this medicine? GOLIMUMAB (goe LIM ue mab) is used to treat rheumatoid arthritis, psoriatic arthritis, ulcerative colitis, and ankylosing spondylitis. This medicine may be used for other purposes; ask your health care provider or pharmacist if you have questions. What should I tell my health care provider before I take this medicine? They need to know if you have any of these conditions: -cancer -diabetes -heart disease -hepatitis B or history of hepatitis B infection -immune system problems -infection or history of infections -low blood counts like low white cell, platelet, or red cell counts -multiple sclerosis -recently received or scheduled to receive a vaccine -scheduled to have surgery -tuberculosis, a positive skin test for tuberculosis or have recently been in close contact with someone who has tuberculosis -an unusual reaction to golimumab, other medicines, latex, rubber, foods, dyes, or preservatives -pregnant or trying to get pregnant -breast-feeding How should I use this medicine? This medicine is for injection under the skin. You will be taught how to prepare and give this medicine. Use exactly as directed. Take your medicine at regular intervals. Do not take your medicine more often than directed. More information is available by calling [phone]. It is important that you put your used needles and syringes in a special sharps container. Do not put them in a trash can. If you do not have a sharps container, call your pharmacist or healthcare provider to get one. A special MedGuide will be given to you by the pharmacist with each prescription and refill. Be sure to read this information carefully each time. Talk to your pediatrician regarding the use of this medicine in children. Special care may be needed. Overdosage: If you think you have taken too much of this medicine contact a poison control center or emergency room at once. NOTE: This  medicine is only for you. Do not share this medicine with others. What if I miss a dose? If you miss a dose, take it as soon as you can. If it is almost time for your next dose, take only that dose. Do not take double or extra doses. Call your doctor or health care professional if you are not sure how to handle a missed dose. What may interact with this medicine? Do not take this medicine with any of the following medications: -abatacept -adalimumab -anakinra -certolizumab -etanercept -infliximab -live virus vaccines -rilonacept This medicine may also interact with the following medications: -cyclosporine -rituximab -theophylline -vaccines -warfarin This list may not describe all possible interactions. Give your health care provider a list of all the medicines, herbs, non-prescription drugs, or dietary supplements you use. Also tell them if you smoke, drink alcohol, or use illegal drugs. Some items may interact with your medicine. What should I watch for while using this medicine? Visit your doctor or health care professional for regular checks on your progress. Tell your doctor or healthcare professional if your symptoms do not start to get better or if they get worse. You will be tested for tuberculosis (TB) before you start this medicine. If your doctor prescribes any medicine for TB, you should start taking the TB medicine before starting this medicine. Make sure to finish the full course of TB medicine. Call your doctor or health care professional if you get a cold or other infection while receiving this medicine. Do not treat yourself. This medicine may decrease your body's ability to fight infection. Talk to your doctor about your risk of cancer. You may be more at risk for certain types  of cancers if you take this medicine. What side effects may I notice from receiving this medicine? Side effects that you should report to your doctor or health care professional as soon as  possible: -allergic reactions like skin rash, itching or hives, swelling of the face, lips, or tongue -breathing problems -changes in vision -chest pain -dark urine -fever, chills, or any other sign of infection -light-colored stools -muscle pain or weakness -numbness or tingling -red, scaly patches or raised bumps on the skin -right upper belly pain -swelling of the ankles -swollen lymph nodes in the neck, underarm, or groin areas -unexplained weight loss -unusual bleeding or bruising -unusually weak or tired -yellowing of the eyes or skin Side effects that usually do not require medical attention (report to your doctor or health care professional if they continue or are bothersome): -dizziness -nausea -redness, itching, swelling, or bruising at site where injected This list may not describe all possible side effects. Call your doctor for medical advice about side effects. You may report side effects to FDA at 1-800-FDA-1088. Where should I keep my medicine? Keep out of the reach of children. Store in the original container and in the refrigerator between 2 and 8 degrees C (36 and 46 degrees F). Do not freeze. Protect from light. Throw away any unused medicine after the expiration date. NOTE: This sheet is a summary. It may not cover all possible information. If you have questions about this medicine, talk to your doctor, pharmacist, or health care provider.    2016, [employer] Standard. (2012-05-09 13:41:25)

## 2015-08-08 ENCOUNTER — Encounter: Payer: Self-pay | Admitting: Internal Medicine

## 2015-08-27 ENCOUNTER — Inpatient Hospital Stay: Admission: RE | Admit: 2015-08-27 | Payer: Medicare Other | Source: Ambulatory Visit

## 2015-09-17 ENCOUNTER — Encounter (HOSPITAL_COMMUNITY): Payer: Medicare Other

## 2015-09-22 DIAGNOSIS — F322 Major depressive disorder, single episode, severe without psychotic features: Secondary | ICD-10-CM | POA: Diagnosis not present

## 2015-09-24 DIAGNOSIS — M545 Low back pain: Secondary | ICD-10-CM | POA: Diagnosis not present

## 2015-09-24 DIAGNOSIS — Z79899 Other long term (current) drug therapy: Secondary | ICD-10-CM | POA: Diagnosis not present

## 2015-09-24 DIAGNOSIS — M0570 Rheumatoid arthritis with rheumatoid factor of unspecified site without organ or systems involvement: Secondary | ICD-10-CM | POA: Diagnosis not present

## 2015-09-24 DIAGNOSIS — M25442 Effusion, left hand: Secondary | ICD-10-CM | POA: Diagnosis not present

## 2015-09-24 DIAGNOSIS — M25441 Effusion, right hand: Secondary | ICD-10-CM | POA: Diagnosis not present

## 2015-09-25 ENCOUNTER — Encounter (HOSPITAL_COMMUNITY)
Admission: RE | Admit: 2015-09-25 | Payer: Medicare Other | Source: Ambulatory Visit | Attending: Internal Medicine | Admitting: Internal Medicine

## 2015-09-25 DIAGNOSIS — F322 Major depressive disorder, single episode, severe without psychotic features: Secondary | ICD-10-CM | POA: Diagnosis not present

## 2015-09-29 ENCOUNTER — Encounter (HOSPITAL_COMMUNITY)
Admission: RE | Admit: 2015-09-29 | Discharge: 2015-09-29 | Disposition: A | Discharge status: Home / Self Care | Payer: Medicare Other | Source: Ambulatory Visit | Attending: Rheumatology | Admitting: Rheumatology

## 2015-09-29 ENCOUNTER — Encounter (HOSPITAL_COMMUNITY): Payer: Self-pay

## 2015-09-29 DIAGNOSIS — M069 Rheumatoid arthritis, unspecified: Secondary | ICD-10-CM | POA: Diagnosis not present

## 2015-09-29 MED ORDER — DIPHENHYDRAMINE HCL 50 MG/ML IJ SOLN: 25.0000 mg | INTRAMUSCULAR | Status: DC | PRN

## 2015-09-29 MED ORDER — SODIUM CHLORIDE 0.9 % IV SOLN
100.0000 mg | INTRAVENOUS | Status: DC
  Administered 2015-09-29: 100 mg via INTRAVENOUS
  Filled 2015-09-29: qty 8

## 2015-09-29 MED ORDER — SODIUM CHLORIDE 0.9 % IV SOLN
INTRAVENOUS | Status: DC
  Administered 2015-09-29: 250 mL via INTRAVENOUS

## 2015-09-29 NOTE — Discharge Instructions (Signed)
SIMPONI Golimumab injection What is this medicine? GOLIMUMAB (goe LIM ue mab) is used to treat rheumatoid arthritis, psoriatic arthritis, ulcerative colitis, and ankylosing spondylitis. This medicine may be used for other purposes; ask your health care provider or pharmacist if you have questions. What should I tell my health care provider before I take this medicine? They need to know if you have any of these conditions: -cancer -diabetes -heart disease -hepatitis B or history of hepatitis B infection -immune system problems -infection or history of infections -low blood counts like low white cell, platelet, or red cell counts -multiple sclerosis -recently received or scheduled to receive a vaccine -scheduled to have surgery -tuberculosis, a positive skin test for tuberculosis or have recently been in close contact with someone who has tuberculosis -an unusual reaction to golimumab, other medicines, latex, rubber, foods, dyes, or preservatives -pregnant or trying to get pregnant -breast-feeding How should I use this medicine? This medicine is for injection under the skin. You will be taught how to prepare and give this medicine. Use exactly as directed. Take your medicine at regular intervals. Do not take your medicine more often than directed. More information is available by calling [phone]. It is important that you put your used needles and syringes in a special sharps container. Do not put them in a trash can. If you do not have a sharps container, call your pharmacist or healthcare provider to get one. A special MedGuide will be given to you by the pharmacist with each prescription and refill. Be sure to read this information carefully each time. Talk to your pediatrician regarding the use of this medicine in children. Special care may be needed. Overdosage: If you think you have taken too much of this medicine contact a poison control center or emergency room at once. NOTE:  This medicine is only for you. Do not share this medicine with others. What if I miss a dose? If you miss a dose, take it as soon as you can. If it is almost time for your next dose, take only that dose. Do not take double or extra doses. Call your doctor or health care professional if you are not sure how to handle a missed dose. What may interact with this medicine? Do not take this medicine with any of the following medications: -abatacept -adalimumab -anakinra -certolizumab -etanercept -infliximab -live virus vaccines -rilonacept This medicine may also interact with the following medications: -cyclosporine -rituximab -theophylline -vaccines -warfarin This list may not describe all possible interactions. Give your health care provider a list of all the medicines, herbs, non-prescription drugs, or dietary supplements you use. Also tell them if you smoke, drink alcohol, or use illegal drugs. Some items may interact with your medicine. What should I watch for while using this medicine? Visit your doctor or health care professional for regular checks on your progress. Tell your doctor or healthcare professional if your symptoms do not start to get better or if they get worse. You will be tested for tuberculosis (TB) before you start this medicine. If your doctor prescribes any medicine for TB, you should start taking the TB medicine before starting this medicine. Make sure to finish the full course of TB medicine. Call your doctor or health care professional if you get a cold or other infection while receiving this medicine. Do not treat yourself. This medicine may decrease your body's ability to fight infection. Talk to your doctor about your risk of cancer. You may be more at risk for certain  types of cancers if you take this medicine. What side effects may I notice from receiving this medicine? Side effects that you should report to your doctor or health care professional as soon as  possible: -allergic reactions like skin rash, itching or hives, swelling of the face, lips, or tongue -breathing problems -changes in vision -chest pain -dark urine -fever, chills, or any other sign of infection -light-colored stools -muscle pain or weakness -numbness or tingling -red, scaly patches or raised bumps on the skin -right upper belly pain -swelling of the ankles -swollen lymph nodes in the neck, underarm, or groin areas -unexplained weight loss -unusual bleeding or bruising -unusually weak or tired -yellowing of the eyes or skin Side effects that usually do not require medical attention (report to your doctor or health care professional if they continue or are bothersome): -dizziness -nausea -redness, itching, swelling, or bruising at site where injected This list may not describe all possible side effects. Call your doctor for medical advice about side effects. You may report side effects to FDA at 1-800-FDA-1088. Where should I keep my medicine? Keep out of the reach of children. Store in the original container and in the refrigerator between 2 and 8 degrees C (36 and 46 degrees F). Do not freeze. Protect from light. Throw away any unused medicine after the expiration date. NOTE: This sheet is a summary. It may not cover all possible information. If you have questions about this medicine, talk to your doctor, pharmacist, or health care provider.    2016, [employer]. (2012-05-09 13:41:25) Arthritis Arthritis is a term that is commonly used to refer to joint pain or joint disease. There are more than 100 types of arthritis. CAUSES The most common cause of this condition is wear and tear of a joint. Other causes include:  Gout.  Inflammation of a joint.  An infection of a joint.  Sprains and other injuries near the joint.  A drug reaction or allergic reaction. In some cases, the cause may not be known. SYMPTOMS The main symptom of this condition is  pain in the joint with movement. Other symptoms include:  Redness, swelling, or stiffness at a joint.  Warmth coming from the joint.  Fever.  Overall feeling of illness. DIAGNOSIS This condition may be diagnosed with a physical exam and tests, including:  Blood tests.  Urine tests.  Imaging tests, such as MRI, X-rays, or a CT scan. Sometimes, fluid is removed from a joint for testing. TREATMENT Treatment for this condition may involve:  Treatment of the cause, if it is known.  Rest.  Raising (elevating) the joint.  Applying cold or hot packs to the joint.  Medicines to improve symptoms and reduce inflammation.  Injections of a steroid such as cortisone into the joint to help reduce pain and inflammation. Depending on the cause of your arthritis, you may need to make lifestyle changes to reduce stress on your joint. These changes may include exercising more and losing weight. HOME CARE INSTRUCTIONS Medicines  Take over-the-counter and prescription medicines only as told by your health care provider.  Do not take aspirin to relieve pain if gout is suspected. Activities  Rest your joint if told by your health care provider. Rest is important when your disease is active and your joint feels painful, swollen, or stiff.  Avoid activities that make the pain worse. It is important to balance activity with rest.  Exercise your joint regularly with range-of-motion exercises as told by your health care provider.  Try doing low-impact exercise, such as:  Swimming.  Water aerobics.  Biking.  Walking. Joint Care  If your joint is swollen, keep it elevated if told by your health care provider.  If your joint feels stiff in the morning, try taking a warm shower.  If directed, apply heat to the joint. If you have diabetes, do not apply heat without permission from your health care provider.  Put a towel between the joint and the hot pack or heating pad.  Leave the heat  on the area for 20-30 minutes.  If directed, apply ice to the joint:  Put ice in a plastic bag.  Place a towel between your skin and the bag.  Leave the ice on for 20 minutes, 2-3 times per day.  Keep all follow-up visits as told by your health care provider. This is important. SEEK MEDICAL CARE IF:  The pain gets worse.  You have a fever. SEEK IMMEDIATE MEDICAL CARE IF:  You develop severe joint pain, swelling, or redness.  Many joints become painful and swollen.  You develop severe back pain.  You develop severe weakness in your leg.  You cannot control your bladder or bowels.   This information is not intended to replace advice given to you by your health care provider. Make sure you discuss any questions you have with your health care provider.   Document Released: 11/11/2004 Document Revised: 06/25/2015 Document Reviewed: 12/30/2014 Elsevier Interactive Patient Education [employer].

## 2015-10-07 ENCOUNTER — Ambulatory Visit
Admission: RE | Admit: 2015-10-07 | Discharge: 2015-10-07 | Disposition: A | Discharge status: Home / Self Care | Payer: Medicare Other | Source: Ambulatory Visit | Attending: Internal Medicine | Admitting: Internal Medicine

## 2015-10-07 DIAGNOSIS — R911 Solitary pulmonary nodule: Secondary | ICD-10-CM

## 2015-10-07 DIAGNOSIS — R918 Other nonspecific abnormal finding of lung field: Secondary | ICD-10-CM | POA: Diagnosis not present

## 2015-10-07 MED ORDER — IOPAMIDOL (ISOVUE-300) INJECTION 61%
75.0000 mL | Freq: Once | INTRAVENOUS | Status: AC | PRN
  Administered 2015-10-07: 75 mL via INTRAVENOUS

## 2015-11-24 ENCOUNTER — Encounter (HOSPITAL_COMMUNITY)
Admission: RE | Admit: 2015-11-24 | Payer: Medicare Other | Source: Ambulatory Visit | Attending: Rheumatology | Admitting: Rheumatology

## 2015-12-09 DIAGNOSIS — J449 Chronic obstructive pulmonary disease, unspecified: Secondary | ICD-10-CM | POA: Diagnosis not present

## 2015-12-09 DIAGNOSIS — F418 Other specified anxiety disorders: Secondary | ICD-10-CM | POA: Diagnosis not present

## 2015-12-09 DIAGNOSIS — I10 Essential (primary) hypertension: Secondary | ICD-10-CM | POA: Diagnosis not present

## 2015-12-09 DIAGNOSIS — M0689 Other specified rheumatoid arthritis, multiple sites: Secondary | ICD-10-CM | POA: Diagnosis not present

## 2015-12-09 DIAGNOSIS — F329 Major depressive disorder, single episode, unspecified: Secondary | ICD-10-CM | POA: Diagnosis not present

## 2015-12-09 DIAGNOSIS — Z6821 Body mass index (BMI) 21.0-21.9, adult: Secondary | ICD-10-CM | POA: Diagnosis not present

## 2015-12-22 DIAGNOSIS — M545 Low back pain: Secondary | ICD-10-CM | POA: Diagnosis not present

## 2015-12-22 DIAGNOSIS — M0570 Rheumatoid arthritis with rheumatoid factor of unspecified site without organ or systems involvement: Secondary | ICD-10-CM | POA: Diagnosis not present

## 2015-12-22 DIAGNOSIS — Z79899 Other long term (current) drug therapy: Secondary | ICD-10-CM | POA: Diagnosis not present

## 2015-12-22 DIAGNOSIS — M25441 Effusion, right hand: Secondary | ICD-10-CM | POA: Diagnosis not present

## 2015-12-22 DIAGNOSIS — M25442 Effusion, left hand: Secondary | ICD-10-CM | POA: Diagnosis not present

## 2015-12-25 DIAGNOSIS — F322 Major depressive disorder, single episode, severe without psychotic features: Secondary | ICD-10-CM | POA: Diagnosis not present

## 2016-01-21 DIAGNOSIS — M545 Low back pain: Secondary | ICD-10-CM | POA: Diagnosis not present

## 2016-01-21 DIAGNOSIS — M0570 Rheumatoid arthritis with rheumatoid factor of unspecified site without organ or systems involvement: Secondary | ICD-10-CM | POA: Diagnosis not present

## 2016-01-21 DIAGNOSIS — M25441 Effusion, right hand: Secondary | ICD-10-CM | POA: Diagnosis not present

## 2016-01-21 DIAGNOSIS — M7532 Calcific tendinitis of left shoulder: Secondary | ICD-10-CM | POA: Diagnosis not present

## 2016-01-21 DIAGNOSIS — M25431 Effusion, right wrist: Secondary | ICD-10-CM | POA: Diagnosis not present

## 2016-01-21 DIAGNOSIS — M25432 Effusion, left wrist: Secondary | ICD-10-CM | POA: Diagnosis not present

## 2016-01-21 DIAGNOSIS — M25442 Effusion, left hand: Secondary | ICD-10-CM | POA: Diagnosis not present

## 2016-01-21 DIAGNOSIS — R799 Abnormal finding of blood chemistry, unspecified: Secondary | ICD-10-CM | POA: Diagnosis not present

## 2016-01-22 DIAGNOSIS — F322 Major depressive disorder, single episode, severe without psychotic features: Secondary | ICD-10-CM | POA: Diagnosis not present

## 2016-01-29 DIAGNOSIS — F322 Major depressive disorder, single episode, severe without psychotic features: Secondary | ICD-10-CM | POA: Diagnosis not present

## 2016-02-05 DIAGNOSIS — F322 Major depressive disorder, single episode, severe without psychotic features: Secondary | ICD-10-CM | POA: Diagnosis not present

## 2016-02-12 DIAGNOSIS — F322 Major depressive disorder, single episode, severe without psychotic features: Secondary | ICD-10-CM | POA: Diagnosis not present

## 2016-02-18 DIAGNOSIS — M0570 Rheumatoid arthritis with rheumatoid factor of unspecified site without organ or systems involvement: Secondary | ICD-10-CM | POA: Diagnosis not present

## 2016-02-18 DIAGNOSIS — Z79899 Other long term (current) drug therapy: Secondary | ICD-10-CM | POA: Diagnosis not present

## 2016-02-18 DIAGNOSIS — M25441 Effusion, right hand: Secondary | ICD-10-CM | POA: Diagnosis not present

## 2016-02-18 DIAGNOSIS — M25512 Pain in left shoulder: Secondary | ICD-10-CM | POA: Diagnosis not present

## 2016-02-18 DIAGNOSIS — M25442 Effusion, left hand: Secondary | ICD-10-CM | POA: Diagnosis not present

## 2016-02-18 DIAGNOSIS — M545 Low back pain: Secondary | ICD-10-CM | POA: Diagnosis not present

## 2016-02-19 DIAGNOSIS — F322 Major depressive disorder, single episode, severe without psychotic features: Secondary | ICD-10-CM | POA: Diagnosis not present

## 2016-02-25 DIAGNOSIS — F322 Major depressive disorder, single episode, severe without psychotic features: Secondary | ICD-10-CM | POA: Diagnosis not present

## 2016-03-01 DIAGNOSIS — Z79899 Other long term (current) drug therapy: Secondary | ICD-10-CM | POA: Diagnosis not present

## 2016-03-01 DIAGNOSIS — M0609 Rheumatoid arthritis without rheumatoid factor, multiple sites: Secondary | ICD-10-CM | POA: Diagnosis not present

## 2016-03-04 DIAGNOSIS — F322 Major depressive disorder, single episode, severe without psychotic features: Secondary | ICD-10-CM | POA: Diagnosis not present

## 2016-03-11 DIAGNOSIS — F322 Major depressive disorder, single episode, severe without psychotic features: Secondary | ICD-10-CM | POA: Diagnosis not present

## 2016-03-18 DIAGNOSIS — F322 Major depressive disorder, single episode, severe without psychotic features: Secondary | ICD-10-CM | POA: Diagnosis not present

## 2016-03-23 DIAGNOSIS — M859 Disorder of bone density and structure, unspecified: Secondary | ICD-10-CM | POA: Diagnosis not present

## 2016-03-29 DIAGNOSIS — M06079 Rheumatoid arthritis without rheumatoid factor, unspecified ankle and foot: Secondary | ICD-10-CM | POA: Diagnosis not present

## 2016-03-29 DIAGNOSIS — Z79899 Other long term (current) drug therapy: Secondary | ICD-10-CM | POA: Diagnosis not present

## 2016-03-30 DIAGNOSIS — I10 Essential (primary) hypertension: Secondary | ICD-10-CM | POA: Diagnosis not present

## 2016-03-30 DIAGNOSIS — F17200 Nicotine dependence, unspecified, uncomplicated: Secondary | ICD-10-CM | POA: Diagnosis not present

## 2016-03-30 DIAGNOSIS — M810 Age-related osteoporosis without current pathological fracture: Secondary | ICD-10-CM | POA: Diagnosis not present

## 2016-03-30 DIAGNOSIS — R609 Edema, unspecified: Secondary | ICD-10-CM | POA: Diagnosis not present

## 2016-04-01 DIAGNOSIS — F322 Major depressive disorder, single episode, severe without psychotic features: Secondary | ICD-10-CM | POA: Diagnosis not present

## 2016-04-08 DIAGNOSIS — F322 Major depressive disorder, single episode, severe without psychotic features: Secondary | ICD-10-CM | POA: Diagnosis not present

## 2016-04-12 ENCOUNTER — Other Ambulatory Visit (HOSPITAL_COMMUNITY): Payer: Self-pay | Admitting: *Deleted

## 2016-04-13 ENCOUNTER — Ambulatory Visit (HOSPITAL_COMMUNITY)
Admission: RE | Admit: 2016-04-13 | Discharge: 2016-04-13 | Disposition: A | Discharge status: Home / Self Care | Payer: Medicare Other | Source: Ambulatory Visit | Attending: Internal Medicine | Admitting: Internal Medicine

## 2016-04-13 DIAGNOSIS — M810 Age-related osteoporosis without current pathological fracture: Secondary | ICD-10-CM | POA: Diagnosis not present

## 2016-04-13 MED ORDER — ZOLEDRONIC ACID 5 MG/100ML IV SOLN
5.0000 mg | Freq: Once | INTRAVENOUS | Status: AC
  Administered 2016-04-13: 5 mg via INTRAVENOUS

## 2016-04-13 MED ORDER — ZOLEDRONIC ACID 5 MG/100ML IV SOLN
INTRAVENOUS | Status: AC
  Filled 2016-04-13: qty 100

## 2016-04-14 DIAGNOSIS — Z79899 Other long term (current) drug therapy: Secondary | ICD-10-CM | POA: Diagnosis not present

## 2016-04-14 DIAGNOSIS — D72829 Elevated white blood cell count, unspecified: Secondary | ICD-10-CM | POA: Diagnosis not present

## 2016-04-14 DIAGNOSIS — M25512 Pain in left shoulder: Secondary | ICD-10-CM | POA: Diagnosis not present

## 2016-04-14 DIAGNOSIS — M545 Low back pain: Secondary | ICD-10-CM | POA: Diagnosis not present

## 2016-04-14 DIAGNOSIS — M0570 Rheumatoid arthritis with rheumatoid factor of unspecified site without organ or systems involvement: Secondary | ICD-10-CM | POA: Diagnosis not present

## 2016-04-14 DIAGNOSIS — R799 Abnormal finding of blood chemistry, unspecified: Secondary | ICD-10-CM | POA: Diagnosis not present

## 2016-04-15 DIAGNOSIS — F322 Major depressive disorder, single episode, severe without psychotic features: Secondary | ICD-10-CM | POA: Diagnosis not present

## 2016-04-15 DIAGNOSIS — H2513 Age-related nuclear cataract, bilateral: Secondary | ICD-10-CM | POA: Diagnosis not present

## 2016-04-21 DIAGNOSIS — M810 Age-related osteoporosis without current pathological fracture: Secondary | ICD-10-CM | POA: Diagnosis not present

## 2016-04-21 DIAGNOSIS — M4802 Spinal stenosis, cervical region: Secondary | ICD-10-CM | POA: Diagnosis not present

## 2016-04-21 DIAGNOSIS — M50322 Other cervical disc degeneration at C5-C6 level: Secondary | ICD-10-CM | POA: Diagnosis not present

## 2016-04-21 DIAGNOSIS — Z6822 Body mass index (BMI) 22.0-22.9, adult: Secondary | ICD-10-CM | POA: Diagnosis not present

## 2016-04-21 DIAGNOSIS — M542 Cervicalgia: Secondary | ICD-10-CM | POA: Diagnosis not present

## 2016-04-22 ENCOUNTER — Other Ambulatory Visit: Payer: Self-pay | Admitting: Internal Medicine

## 2016-04-22 DIAGNOSIS — M542 Cervicalgia: Secondary | ICD-10-CM

## 2016-04-23 ENCOUNTER — Ambulatory Visit
Admission: RE | Admit: 2016-04-23 | Discharge: 2016-04-23 | Disposition: A | Discharge status: Home / Self Care | Payer: Medicare Other | Source: Ambulatory Visit | Attending: Internal Medicine | Admitting: Internal Medicine

## 2016-04-23 DIAGNOSIS — M542 Cervicalgia: Secondary | ICD-10-CM

## 2016-04-23 DIAGNOSIS — M4802 Spinal stenosis, cervical region: Secondary | ICD-10-CM | POA: Diagnosis not present

## 2016-04-28 DIAGNOSIS — M9901 Segmental and somatic dysfunction of cervical region: Secondary | ICD-10-CM | POA: Diagnosis not present

## 2016-04-28 DIAGNOSIS — M9903 Segmental and somatic dysfunction of lumbar region: Secondary | ICD-10-CM | POA: Diagnosis not present

## 2016-04-28 DIAGNOSIS — M9902 Segmental and somatic dysfunction of thoracic region: Secondary | ICD-10-CM | POA: Diagnosis not present

## 2016-04-28 DIAGNOSIS — M5137 Other intervertebral disc degeneration, lumbosacral region: Secondary | ICD-10-CM | POA: Diagnosis not present

## 2016-04-28 DIAGNOSIS — M9904 Segmental and somatic dysfunction of sacral region: Secondary | ICD-10-CM | POA: Diagnosis not present

## 2016-04-28 DIAGNOSIS — M9905 Segmental and somatic dysfunction of pelvic region: Secondary | ICD-10-CM | POA: Diagnosis not present

## 2016-04-28 DIAGNOSIS — Q72811 Congenital shortening of right lower limb: Secondary | ICD-10-CM | POA: Diagnosis not present

## 2016-04-28 DIAGNOSIS — M50322 Other cervical disc degeneration at C5-C6 level: Secondary | ICD-10-CM | POA: Diagnosis not present

## 2016-04-28 DIAGNOSIS — M791 Myalgia: Secondary | ICD-10-CM | POA: Diagnosis not present

## 2016-04-29 ENCOUNTER — Encounter (HOSPITAL_COMMUNITY): Payer: Self-pay | Admitting: Emergency Medicine

## 2016-04-29 ENCOUNTER — Inpatient Hospital Stay (HOSPITAL_COMMUNITY)
Admission: EM | Admit: 2016-04-29 | Discharge: 2016-05-01 | DRG: 377 | Disposition: A | Discharge status: Home / Self Care | Payer: Medicare Other | Attending: Internal Medicine | Admitting: Internal Medicine

## 2016-04-29 ENCOUNTER — Emergency Department (HOSPITAL_COMMUNITY): Payer: Medicare Other

## 2016-04-29 DIAGNOSIS — M069 Rheumatoid arthritis, unspecified: Secondary | ICD-10-CM | POA: Diagnosis present

## 2016-04-29 DIAGNOSIS — N189 Chronic kidney disease, unspecified: Secondary | ICD-10-CM | POA: Diagnosis not present

## 2016-04-29 DIAGNOSIS — D899 Disorder involving the immune mechanism, unspecified: Secondary | ICD-10-CM | POA: Diagnosis not present

## 2016-04-29 DIAGNOSIS — Z79899 Other long term (current) drug therapy: Secondary | ICD-10-CM | POA: Diagnosis not present

## 2016-04-29 DIAGNOSIS — F410 Panic disorder [episodic paroxysmal anxiety] without agoraphobia: Secondary | ICD-10-CM | POA: Diagnosis present

## 2016-04-29 DIAGNOSIS — Z825 Family history of asthma and other chronic lower respiratory diseases: Secondary | ICD-10-CM | POA: Diagnosis not present

## 2016-04-29 DIAGNOSIS — J449 Chronic obstructive pulmonary disease, unspecified: Secondary | ICD-10-CM | POA: Diagnosis present

## 2016-04-29 DIAGNOSIS — G5602 Carpal tunnel syndrome, left upper limb: Secondary | ICD-10-CM | POA: Diagnosis present

## 2016-04-29 DIAGNOSIS — R748 Abnormal levels of other serum enzymes: Secondary | ICD-10-CM

## 2016-04-29 DIAGNOSIS — F329 Major depressive disorder, single episode, unspecified: Secondary | ICD-10-CM | POA: Diagnosis present

## 2016-04-29 DIAGNOSIS — E876 Hypokalemia: Secondary | ICD-10-CM | POA: Diagnosis present

## 2016-04-29 DIAGNOSIS — G8929 Other chronic pain: Secondary | ICD-10-CM | POA: Diagnosis present

## 2016-04-29 DIAGNOSIS — N62 Hypertrophy of breast: Secondary | ICD-10-CM | POA: Diagnosis present

## 2016-04-29 DIAGNOSIS — B3781 Candidal esophagitis: Secondary | ICD-10-CM

## 2016-04-29 DIAGNOSIS — Z886 Allergy status to analgesic agent status: Secondary | ICD-10-CM

## 2016-04-29 DIAGNOSIS — Z801 Family history of malignant neoplasm of trachea, bronchus and lung: Secondary | ICD-10-CM

## 2016-04-29 DIAGNOSIS — M542 Cervicalgia: Secondary | ICD-10-CM

## 2016-04-29 DIAGNOSIS — Z87442 Personal history of urinary calculi: Secondary | ICD-10-CM | POA: Diagnosis not present

## 2016-04-29 DIAGNOSIS — Z800 Family history of malignant neoplasm of digestive organs: Secondary | ICD-10-CM

## 2016-04-29 DIAGNOSIS — K209 Esophagitis, unspecified without bleeding: Secondary | ICD-10-CM | POA: Diagnosis present

## 2016-04-29 DIAGNOSIS — E43 Unspecified severe protein-calorie malnutrition: Secondary | ICD-10-CM | POA: Diagnosis not present

## 2016-04-29 DIAGNOSIS — B370 Candidal stomatitis: Secondary | ICD-10-CM | POA: Diagnosis present

## 2016-04-29 DIAGNOSIS — Z7952 Long term (current) use of systemic steroids: Secondary | ICD-10-CM | POA: Diagnosis not present

## 2016-04-29 DIAGNOSIS — E878 Other disorders of electrolyte and fluid balance, not elsewhere classified: Secondary | ICD-10-CM | POA: Diagnosis present

## 2016-04-29 DIAGNOSIS — R509 Fever, unspecified: Secondary | ICD-10-CM

## 2016-04-29 DIAGNOSIS — D696 Thrombocytopenia, unspecified: Secondary | ICD-10-CM | POA: Diagnosis present

## 2016-04-29 DIAGNOSIS — F17210 Nicotine dependence, cigarettes, uncomplicated: Secondary | ICD-10-CM | POA: Diagnosis present

## 2016-04-29 DIAGNOSIS — K2960 Other gastritis without bleeding: Secondary | ICD-10-CM | POA: Diagnosis not present

## 2016-04-29 DIAGNOSIS — E872 Acidosis: Secondary | ICD-10-CM | POA: Diagnosis present

## 2016-04-29 DIAGNOSIS — R634 Abnormal weight loss: Secondary | ICD-10-CM | POA: Diagnosis not present

## 2016-04-29 DIAGNOSIS — Z7951 Long term (current) use of inhaled steroids: Secondary | ICD-10-CM | POA: Diagnosis not present

## 2016-04-29 DIAGNOSIS — Z6821 Body mass index (BMI) 21.0-21.9, adult: Secondary | ICD-10-CM

## 2016-04-29 DIAGNOSIS — N289 Disorder of kidney and ureter, unspecified: Secondary | ICD-10-CM

## 2016-04-29 DIAGNOSIS — M109 Gout, unspecified: Secondary | ICD-10-CM | POA: Diagnosis present

## 2016-04-29 DIAGNOSIS — J438 Other emphysema: Secondary | ICD-10-CM

## 2016-04-29 DIAGNOSIS — R1310 Dysphagia, unspecified: Secondary | ICD-10-CM | POA: Diagnosis not present

## 2016-04-29 DIAGNOSIS — N179 Acute kidney failure, unspecified: Secondary | ICD-10-CM | POA: Diagnosis present

## 2016-04-29 DIAGNOSIS — J8410 Pulmonary fibrosis, unspecified: Secondary | ICD-10-CM | POA: Diagnosis present

## 2016-04-29 DIAGNOSIS — E860 Dehydration: Secondary | ICD-10-CM | POA: Diagnosis present

## 2016-04-29 DIAGNOSIS — H9190 Unspecified hearing loss, unspecified ear: Secondary | ICD-10-CM | POA: Diagnosis present

## 2016-04-29 DIAGNOSIS — R079 Chest pain, unspecified: Secondary | ICD-10-CM | POA: Diagnosis not present

## 2016-04-29 DIAGNOSIS — K222 Esophageal obstruction: Secondary | ICD-10-CM

## 2016-04-29 DIAGNOSIS — E785 Hyperlipidemia, unspecified: Secondary | ICD-10-CM | POA: Diagnosis present

## 2016-04-29 DIAGNOSIS — I10 Essential (primary) hypertension: Secondary | ICD-10-CM | POA: Diagnosis present

## 2016-04-29 DIAGNOSIS — K2951 Unspecified chronic gastritis with bleeding: Secondary | ICD-10-CM | POA: Diagnosis not present

## 2016-04-29 LAB — URINALYSIS, ROUTINE W REFLEX MICROSCOPIC
Bilirubin Urine: NEGATIVE
Glucose, UA: NEGATIVE mg/dL
Ketones, ur: NEGATIVE mg/dL
Leukocytes, UA: NEGATIVE
Nitrite: NEGATIVE
Protein, ur: >300 mg/dL — AB
Specific Gravity, Urine: 1.025 (ref 1.005–1.030)
pH: 6 (ref 5.0–8.0)

## 2016-04-29 LAB — BASIC METABOLIC PANEL
Anion gap: 12 (ref 5–15)
BUN: 34 mg/dL — ABNORMAL HIGH (ref 6–20)
CO2: 19 mmol/L — ABNORMAL LOW (ref 22–32)
Calcium: 8.2 mg/dL — ABNORMAL LOW (ref 8.9–10.3)
Chloride: 106 mmol/L (ref 101–111)
Creatinine, Ser: 2.09 mg/dL — ABNORMAL HIGH (ref 0.61–1.24)
GFR calc Af Amer: 36 mL/min — ABNORMAL LOW (ref >60)
GFR calc non Af Amer: 31 mL/min — ABNORMAL LOW (ref >60)
Glucose, Bld: 115 mg/dL — ABNORMAL HIGH (ref 65–99)
Potassium: 3.4 mmol/L — ABNORMAL LOW (ref 3.5–5.1)
Sodium: 137 mmol/L (ref 135–145)

## 2016-04-29 LAB — CBC
HCT: 42.5 % (ref 39.0–52.0)
Hemoglobin: 14 g/dL (ref 13.0–17.0)
MCH: 34.1 pg — ABNORMAL HIGH (ref 26.0–34.0)
MCHC: 32.9 g/dL (ref 30.0–36.0)
MCV: 103.7 fL — ABNORMAL HIGH (ref 78.0–100.0)
Platelets: 88 10*3/uL — ABNORMAL LOW (ref 150–400)
RBC: 4.1 MIL/uL — ABNORMAL LOW (ref 4.22–5.81)
RDW: 12.5 % (ref 11.5–15.5)
WBC: 6.3 10*3/uL (ref 4.0–10.5)

## 2016-04-29 LAB — SEDIMENTATION RATE: SED RATE: 100 mm/h — AB (ref 0–16)

## 2016-04-29 LAB — URINE MICROSCOPIC-ADD ON

## 2016-04-29 LAB — I-STAT CG4 LACTIC ACID, ED: Lactic Acid, Venous: 1.72 mmol/L (ref 0.5–1.9)

## 2016-04-29 LAB — RAPID STREP SCREEN (MED CTR MEBANE ONLY): Streptococcus, Group A Screen (Direct): NEGATIVE

## 2016-04-29 LAB — C-REACTIVE PROTEIN: CRP: 24.2 mg/dL — AB (ref <1.0)

## 2016-04-29 LAB — CBG MONITORING, ED: Glucose-Capillary: 114 mg/dL — ABNORMAL HIGH (ref 65–99)

## 2016-04-29 MED ORDER — MOMETASONE FURO-FORMOTEROL FUM 100-5 MCG/ACT IN AERO
2.0000 | INHALATION_SPRAY | Freq: Two times a day (BID) | RESPIRATORY_TRACT | Status: DC
  Administered 2016-04-29 – 2016-05-01 (×3): 2 via RESPIRATORY_TRACT
  Filled 2016-04-29: qty 8.8

## 2016-04-29 MED ORDER — MORPHINE SULFATE (PF) 4 MG/ML IV SOLN
4.0000 mg | Freq: Once | INTRAVENOUS | Status: AC
Start: 2016-04-29 — End: 2016-04-29
  Administered 2016-04-29: 4 mg via INTRAVENOUS
  Filled 2016-04-29: qty 1

## 2016-04-29 MED ORDER — PREDNISONE 5 MG PO TABS
10.0000 mg | ORAL_TABLET | Freq: Every day | ORAL | Status: DC
  Administered 2016-04-29 – 2016-05-01 (×2): 10 mg via ORAL
  Filled 2016-04-29 (×3): qty 2

## 2016-04-29 MED ORDER — CLONAZEPAM 0.5 MG PO TABS
0.5000 mg | ORAL_TABLET | Freq: Every day | ORAL | Status: DC
  Administered 2016-04-29: 0.5 mg via ORAL
  Filled 2016-04-29 (×3): qty 1

## 2016-04-29 MED ORDER — FLUTICASONE PROPIONATE 50 MCG/ACT NA SUSP
2.0000 | Freq: Every day | NASAL | Status: DC
  Administered 2016-04-29 – 2016-05-01 (×2): 2 via NASAL
  Filled 2016-04-29: qty 16

## 2016-04-29 MED ORDER — NYSTATIN 100000 UNIT/ML MT SUSP
5.0000 mL | Freq: Four times a day (QID) | OROMUCOSAL | Status: DC
  Administered 2016-04-29 – 2016-05-01 (×7): 500000 [IU] via ORAL
  Filled 2016-04-29 (×9): qty 5

## 2016-04-29 MED ORDER — SODIUM CHLORIDE 0.9 % IV SOLN: INTRAVENOUS | Status: DC

## 2016-04-29 MED ORDER — SODIUM CHLORIDE 0.9 % IV SOLN
INTRAVENOUS | Status: DC
  Administered 2016-04-29 – 2016-05-01 (×4): via INTRAVENOUS

## 2016-04-29 MED ORDER — POTASSIUM CHLORIDE 10 MEQ/100ML IV SOLN: 10.0000 meq | INTRAVENOUS | Status: DC

## 2016-04-29 MED ORDER — SODIUM CHLORIDE 0.9% FLUSH
3.0000 mL | Freq: Two times a day (BID) | INTRAVENOUS | Status: DC
  Administered 2016-04-29: 3 mL via INTRAVENOUS

## 2016-04-29 MED ORDER — FOLIC ACID 1 MG PO TABS
1.0000 mg | ORAL_TABLET | Freq: Every day | ORAL | Status: DC
  Administered 2016-04-29 – 2016-05-01 (×2): 1 mg via ORAL
  Filled 2016-04-29 (×2): qty 1

## 2016-04-29 MED ORDER — POTASSIUM CHLORIDE 10 MEQ/100ML IV SOLN
10.0000 meq | INTRAVENOUS | Status: AC
  Administered 2016-04-29 – 2016-04-30 (×3): 10 meq via INTRAVENOUS
  Filled 2016-04-29 (×2): qty 100

## 2016-04-29 MED ORDER — FLUCONAZOLE IN SODIUM CHLORIDE 200-0.9 MG/100ML-% IV SOLN
200.0000 mg | Freq: Once | INTRAVENOUS | Status: AC
  Administered 2016-04-29: 200 mg via INTRAVENOUS
  Filled 2016-04-29: qty 100

## 2016-04-29 MED ORDER — ENOXAPARIN SODIUM 40 MG/0.4ML ~~LOC~~ SOLN
40.0000 mg | SUBCUTANEOUS | Status: DC
  Administered 2016-04-29 – 2016-04-30 (×2): 40 mg via SUBCUTANEOUS
  Filled 2016-04-29 (×2): qty 0.4

## 2016-04-29 MED ORDER — HYDROCODONE-ACETAMINOPHEN 5-325 MG PO TABS
1.0000 | ORAL_TABLET | Freq: Four times a day (QID) | ORAL | Status: DC | PRN
  Administered 2016-04-29 – 2016-05-01 (×3): 1 via ORAL
  Filled 2016-04-29 (×3): qty 1

## 2016-04-29 MED ORDER — NYSTATIN 100000 UNIT/ML MT SUSP
5.0000 mL | Freq: Once | OROMUCOSAL | Status: AC
Start: 2016-04-29 — End: 2016-04-29
  Administered 2016-04-29: 500000 [IU] via OROMUCOSAL
  Filled 2016-04-29: qty 5

## 2016-04-29 MED ORDER — ACETAMINOPHEN 325 MG PO TABS: 325.0000 mg | ORAL_TABLET | ORAL | Status: DC | PRN

## 2016-04-29 MED ORDER — NICOTINE 21 MG/24HR TD PT24
21.0000 mg | MEDICATED_PATCH | Freq: Every day | TRANSDERMAL | Status: DC
  Administered 2016-04-29 – 2016-05-01 (×3): 21 mg via TRANSDERMAL
  Filled 2016-04-29 (×3): qty 1

## 2016-04-29 MED ORDER — METHOTREXATE 2.5 MG PO TABS: 7.5000 mg | ORAL_TABLET | ORAL | Status: DC

## 2016-04-29 MED ORDER — SODIUM CHLORIDE 0.9 % IV BOLUS (SEPSIS)
2000.0000 mL | Freq: Once | INTRAVENOUS | Status: AC
  Administered 2016-04-29: 2000 mL via INTRAVENOUS

## 2016-04-29 MED ORDER — SODIUM CHLORIDE 0.9 % IV BOLUS (SEPSIS)
1000.0000 mL | Freq: Once | INTRAVENOUS | Status: AC
  Administered 2016-04-29: 1000 mL via INTRAVENOUS

## 2016-04-29 MED ORDER — DEXTROSE 5 % IV SOLN
1.0000 g | Freq: Once | INTRAVENOUS | Status: AC
  Administered 2016-04-29: 1 g via INTRAVENOUS
  Filled 2016-04-29: qty 10

## 2016-04-29 MED ORDER — FLUCONAZOLE IN SODIUM CHLORIDE 100-0.9 MG/50ML-% IV SOLN
100.0000 mg | INTRAVENOUS | Status: DC
  Filled 2016-04-29: qty 50

## 2016-04-29 MED ORDER — TIOTROPIUM BROMIDE MONOHYDRATE 18 MCG IN CAPS
1.0000 | ORAL_CAPSULE | Freq: Every day | RESPIRATORY_TRACT | Status: DC
  Administered 2016-05-01: 18 ug via RESPIRATORY_TRACT
  Filled 2016-04-29: qty 5

## 2016-04-29 MED ORDER — ACETAMINOPHEN 650 MG RE SUPP
650.0000 mg | Freq: Once | RECTAL | Status: AC
  Administered 2016-04-29: 650 mg via RECTAL
  Filled 2016-04-29: qty 1

## 2016-04-29 NOTE — ED Notes (Signed)
Patient presents from PCP. C/o difficulty swallowing, decreased PO intake, subjective fever, tachycardia and generalized weakness.

## 2016-04-29 NOTE — ED Provider Notes (Signed)
CSN: [id number]     Arrival date & time 04/29/16  1221 History  By signing my name below, I, Rayna Sexton, attest that this documentation has been prepared under the direction and in the presence of Virgel Manifold, MD. Electronically Signed: Rayna Sexton, ED Scribe. 04/29/2016. 12:57 PM.   Chief Complaint  Patient presents with  . Weakness  . Dysphagia   The history is provided by the patient and the spouse. No language interpreter was used.    HPI Comments: Kenneth [name] is a 70 y.o. male with a significant PMHx listed below who presents to the Emergency Department from his PCP complaining of generalized weakness onset 11 days ago. His wife states that he woke up on July 2nd and began experiencing diffuse neck pain, upper chest pain and dysphagia. He was initially evaluated by his PCP and had x-rays and an MRI performed and was told it could be associated with his DDD. He states that his symptoms have progressively worsened including his dysphagia stating when swallowing things they "get hung up" and "take a long time to go down". He reports associated coughing, subjective fever (99.2 F in triage), mild epigastric cramping, decreased food intake due to discomfort and a burning sensation in his central chest and neck. His wife states that he has lost ~7 pounds since the onset of his symptoms. Pt smoked for 47 years. He confirms his listed PCP. Pt denies hemoptysis.   Past Medical History  Diagnosis Date  . Gynecomastia   . Rheumatoid arthritis(714.0) dx 2000  . Situational hypertension   . Tobacco abuse   . Hyperlipidemia   . Anxiety   . Panic attack   . Elevated MCV   . Abnormal LFTs   . Nephrolithiasis   . CTS (carpal tunnel syndrome)     Left  . Gout   . Microhematuria   . DDD (degenerative disc disease)   . Ocular migraine   . Hearing loss   . COPD (chronic obstructive pulmonary disease) (HCC)     emphysema.  . Depression     mild  . Emphysema of lung [facility])    Past  Surgical History  Procedure Laterality Date  . Appendectomy    . Colonoscopy    . Cystostomy w/ bladder biopsy  2006   Family History  Problem Relation Age of Onset  . Asthma Paternal Grandmother   . Lung cancer Father   . Lung cancer Paternal Aunt   . Breast cancer Maternal Aunt   . Breast cancer Maternal Aunt   . Stomach cancer Maternal Uncle   . Colon cancer Neg Hx    Social History  Substance Use Topics  . Smoking status: Current Every Day Smoker -- 0.50 packs/day for 44 years    Types: Cigarettes  . Smokeless tobacco: Never Used  . Alcohol Use: No    Review of Systems  Constitutional: Positive for fever, appetite change, fatigue and unexpected weight change.  HENT: Positive for sore throat and trouble swallowing.   Respiratory: Positive for cough.   Cardiovascular: Positive for chest pain.  Gastrointestinal: Positive for abdominal pain.  Neurological: Positive for weakness.  All other systems reviewed and are negative.   Allergies  Aspirin and Naproxen sodium  Home Medications   Prior to Admission medications   Medication Sig Start Date End Date Taking? Authorizing Provider  acetaminophen (TYLENOL) 325 MG tablet Take 325 mg by mouth as needed.    Historical Provider, MD  amLODipine (NORVASC) 5 MG tablet  Take 5 mg by mouth daily.    Historical Provider, MD  benazepril (LOTENSIN) 10 MG tablet Take 10 mg by mouth daily.    Historical Provider, MD  budesonide-formoterol (SYMBICORT) 80-4.5 MCG/ACT inhaler Inhale 2 puffs into the lungs 2 (two) times daily.    Historical Provider, MD  cholecalciferol (VITAMIN D) 1000 UNITS tablet Take 1,000 Units by mouth daily.      Historical Provider, MD  clonazePAM (KLONOPIN) 0.5 MG tablet Take 0.5 mg by mouth 2 (two) times daily.      Historical Provider, MD  Dextromethorphan-Guaifenesin (TUSSIN DM PO) Take by mouth as directed.    Historical Provider, MD  DULoxetine (CYMBALTA) 30 MG capsule Take 40 mg by mouth daily.     Historical  Provider, MD  fexofenadine (ALLEGRA) 180 MG tablet Take 180 mg by mouth daily. 02/16/12   Historical Provider, MD  fluticasone (FLONASE) 50 MCG/ACT nasal spray Place 2 sprays into the nose daily.      Historical Provider, MD  folic acid (FOLVITE) 1 MG tablet Take 1 mg by mouth daily.      Historical Provider, MD  HYDROcodone-acetaminophen (VICODIN) 5-500 MG per tablet Take 1 tablet by mouth 2 (two) times daily as needed.      Historical Provider, MD  methotrexate (RHEUMATREX) 2.5 MG tablet 2 (two) times a week. Take 5 tabs every Friday.    Historical Provider, MD  promethazine (PHENERGAN) 25 MG tablet Take 25 mg by mouth 2 (two) times daily as needed.      Historical Provider, MD  rosuvastatin (CRESTOR) 20 MG tablet Take 10 mg by mouth daily.      Historical Provider, MD  sodium chloride 0.9 % SOLN 92 mL with golimumab 50 MG/4ML SOLN 100 mg Inject 100 mg into the vein once. 10/09/12   Hurley Cisco, MD  zoledronic acid (RECLAST) 5 MG/100ML SOLN Inject 5 mg into the vein once. Every 2 years     Historical Provider, MD   Pulse 69  Temp(Src) 99.2 F (37.3 C) (Oral)  Resp 29  SpO2 93%    Physical Exam  Constitutional: He is oriented to person, place, and time. He appears well-developed.  Tired appearing  HENT:  Head: Normocephalic.  Beefy appearance of posterior pharynx. White patches on base of tongue and pharynx consistent with thrush.  Eyes: EOM are normal.  Neck: Normal range of motion.  Cardiovascular: Tachycardia present.   Pulmonary/Chest: Effort normal.  Abdominal: He exhibits no distension.  Musculoskeletal: Normal range of motion.  Neurological: He is alert and oriented to person, place, and time.  Psychiatric: He has a normal mood and affect.  Nursing note and vitals reviewed.  ED Course  Procedures  DIAGNOSTIC STUDIES: Oxygen Saturation is 93% on RA, normal by my interpretation.    COORDINATION OF CARE: 12:53 PM Discussed next steps with pt. Pt verbalized understanding  and is agreeable with the plan.   Labs Review Labs Reviewed  BASIC METABOLIC PANEL - Abnormal; Notable for the following:    Potassium 3.4 (*)    CO2 19 (*)    Glucose, Bld 115 (*)    BUN 34 (*)    Creatinine, Ser 2.09 (*)    Calcium 8.2 (*)    GFR calc non Af Amer 31 (*)    GFR calc Af Amer 36 (*)    All other components within normal limits  CBC - Abnormal; Notable for the following:    RBC 4.10 (*)    MCV 103.7 (*)  MCH 34.1 (*)    Platelets 88 (*)    All other components within normal limits  URINALYSIS, ROUTINE W REFLEX MICROSCOPIC (NOT AT [facility]) - Abnormal; Notable for the following:    APPearance CLOUDY (*)    Hgb urine dipstick LARGE (*)    Protein, ur >300 (*)    All other components within normal limits  URINE MICROSCOPIC-ADD ON - Abnormal; Notable for the following:    Squamous Epithelial / LPF 0-5 (*)    Bacteria, UA MANY (*)    Casts GRANULAR CAST (*)    Crystals CORRECTED RESULTS CALLED TO: (*)    All other components within normal limits  CBG MONITORING, ED - Abnormal; Notable for the following:    Glucose-Capillary 114 (*)    All other components within normal limits  RAPID STREP SCREEN (NOT AT [facility])  CULTURE, BLOOD (ROUTINE X 2)  CULTURE, BLOOD (ROUTINE X 2)  CULTURE, GROUP A STREP ([facility])  URINE CULTURE  SEDIMENTATION RATE  C-REACTIVE PROTEIN  I-STAT CG4 LACTIC ACID, ED    Imaging Review Dg Chest 2 View  04/29/2016  CLINICAL DATA:  Generalized weakness, fever, upper chest pain, dysphagia EXAM: CHEST  2 VIEW COMPARISON:  10/07/2015 FINDINGS: Cardiomediastinal silhouette is stable. Hyperinflation again noted. Stable emphysematous changes and fibrotic changes especially in lower lobes. No definite superimposed infiltrate or pulmonary edema. Bony thorax is unremarkable. IMPRESSION: No active disease. Stable hyperinflation, chronic interstitial prominence and fibrotic changes. Electronically Signed   By: Lahoma Crocker M.D.   On: 04/29/2016 13:31   Ct Soft  Tissue Neck Wo Contrast  04/29/2016  CLINICAL DATA:  69 y/o M; generalized weakness with onset 11 days ago. He woke up on July 2nd began experiencing diffuse neck pain, upper chest pain, and dysphagia. EXAM: CT NECK WITHOUT CONTRAST TECHNIQUE: Multidetector CT imaging of the neck was performed following the standard protocol without intravenous contrast. COMPARISON:  MRI of the cervical spine dated 04/23/2016. Chest CT dated 10/07/2015. FINDINGS: Pharynx and larynx: No exophytic mucosal mass is identified. No inflammatory changes. 3 mm left-sided internal laryngocele. Salivary glands: Punctate calcifications within the left parotid gland probably represents sequelae prior parotiditis. L1 salivary glands are unremarkable. Thyroid: Unremarkable Lymph nodes: No lymphadenopathy. Vascular: Calcified plaque of the brachiocephalic and bilateral proximal subclavian arteries. Calcified plaque common carotid arteries bilaterally and of the carotid bifurcations moderate calcified plaque of the cavernous internal carotid arteries. Limited intracranial: Negative. Visualized orbits: Negative. Mastoids and visualized paranasal sinuses: Normally aerated. Leftward buckling of the anterior nasal septum may represent sequela of old trauma. Right concha bullosa. Hypoplastic right maxillary sinus. Skeleton: Cervical spondylosis greatest at the C5 and C7 levels is better characterized on the prior MRI of the cervical spine. No acute bony or articular abnormality is identified Upper chest: Severe emphysema of the lung apices and biapical calcified scarring. Stable 6 mm nodule in the left upper lobe series 7, image 5. IMPRESSION: 1. No findings explanation for diffuse neck pain is identified. 2. Severe emphysema of lung apices and left upper lobe nodule stable from prior chest CT. 3. Cervical spine degenerative changes as seen on prior cervical MRI. No acute bony or articular abnormality is identified. 4. Calcified plaque of great vessels  and carotid bifurcations. 5. No lymphadenopathy or discrete cervical mass is identified on this noncontrast examination. Electronically Signed   By: Kristine Garbe M.D.   On: 04/29/2016 15:15   I have personally reviewed and evaluated these images and lab results as part of my  medical decision-making.   EKG Interpretation   Date/Time:  Thursday April 29 2016 12:38:01 EDT Ventricular Rate:  88 PR Interval:    QRS Duration: 84 QT Interval:  326 QTC Calculation: 395 R Axis:   59 Text Interpretation:  Sinus tachycardia Atrial premature complexes Short  PR interval Minimal ST depression, diffuse leads Confirmed by Wilson Singer  MD,  Porter Nakama ([id number]) on 04/29/2016 2:34:20 PM      MDM   Final diagnoses:  Candidal esophagitis (HCC)  Renal impairment    69yM with neck/upper chest pain, fever, dysphagia, anorexia. Suspect neck/throat pain is from esophageal candidiasis. Noncontrast CT without explanatory pathology. Consider malignancy with smoking hx. Given his inability to take adequate PO, will admit for further tx/evaluation.   I personally preformed the services scribed in my presence. The recorded information has been reviewed is accurate. Virgel Manifold, MD.    Virgel Manifold, MD 05/01/16 [phone]

## 2016-04-29 NOTE — ED Notes (Signed)
17:37 

## 2016-04-29 NOTE — Progress Notes (Signed)
Methotrexate (Trexall; Rheumatrex) hold criteria  Hgb < 8  WBC < 3  Pltc < 100K  SCr > 1.5x baseline (or > 2 if baseline unknown)  AST or ALT >3x ULN  Bili > 1.5x ULN  Ascites or pleural effusion  Diarrhea - Grade 2 or higher  Ulcerative stomatitis  Unexplained pneumonitis / hypoxemia  Active infection   Currently, plt are less than 100k, SCr is greater than 2, and has a current active infection.  Therefore, will hold med at the current time per P and T protocol.  Royetta Asal, PharmD, BCPS Pager [phone] 04/29/2016 7:23 PM

## 2016-04-29 NOTE — H&P (Signed)
History and Physical  Kenneth [name] HCW:[id number] DOB: October 13, 1946 DOA: 04/29/2016  Referring physician: EDP PCP: Jerlyn Ly, MD   Chief Complaint: difficulty swallowing, jaw/neck / anterior chest pain, fever  HPI: Kenneth [name] is a 70 y.o. male   Life long smoker , with h/o copd (not on home 02, not steroids dependent), h/o advanced rheumatoid arthritis on chronic immunosuppression currently on methotrexate and daily prednisone, h/o pulmonary fibrosis who recently developed jaw/neck/anterior chest pain, he underwent MRI of his neck was in the process of getting referred to chiropractice for DJD, however, his symptom progressed , he developed difficulty swallowing with  Solid and liquid, reported pain with swallowing and weight loss, this morning, he looked weak and started drooling, wife got him to chiropractioner's office who is concerned about him being sick, he is referred to [facility] ED.   ED course: upon arrival to the ED, he is found to be febrile of 102, sinus tachycardia hr of 114, bp 106/78. Labs no leukocytosis, plt 88, cr 2.09, lactic acid wnl, cxr no acute findings, Ct soft tissue neck no acute findings, ua + bacteria, negative leuk, negative nitrite. On exam, he was found to have oral thrush, he is treated with topical nystatin solution, iv rocephin for possible uti, hospitalist called to admit the patient.  Review of Systems:  Detail per HPI, Review of systems are otherwise negative  Past Medical History  Diagnosis Date  . Gynecomastia   . Rheumatoid arthritis(714.0) dx 2000  . Situational hypertension   . Tobacco abuse   . Hyperlipidemia   . Anxiety   . Panic attack   . Elevated MCV   . Abnormal LFTs   . Nephrolithiasis   . CTS (carpal tunnel syndrome)     Left  . Gout   . Microhematuria   . DDD (degenerative disc disease)   . Ocular migraine   . Hearing loss   . COPD (chronic obstructive pulmonary disease) (HCC)     emphysema.  . Depression     mild  . Emphysema  of lung [facility])    Past Surgical History  Procedure Laterality Date  . Appendectomy    . Colonoscopy    . Cystostomy w/ bladder biopsy  2006   Social History:  reports that he has been smoking Cigarettes.  He has a 22 pack-year smoking history. He has never used smokeless tobacco. He reports that he does not drink alcohol or use illicit drugs. Patient lives at home & is able to participate in activities of daily living with assistance, he walked with a cane, does has chronic pain due to advanced RA  Allergies  Allergen Reactions  . Aspirin Other (See Comments)    Gi upset  . Naproxen Sodium Other (See Comments)    Whelps in large doses    Family History  Problem Relation Age of Onset  . Asthma Paternal Grandmother   . Lung cancer Father   . Lung cancer Paternal Aunt   . Breast cancer Maternal Aunt   . Breast cancer Maternal Aunt   . Stomach cancer Maternal Uncle   . Colon cancer Neg Hx       Prior to Admission medications   Medication Sig Start Date End Date Taking? Authorizing Provider  acetaminophen (TYLENOL) 325 MG tablet Take 325 mg by mouth every 4 (four) hours as needed for mild pain.    Yes Historical Provider, MD  amLODipine (NORVASC) 5 MG tablet Take 2.5 mg by mouth daily.  Yes Historical Provider, MD  budesonide-formoterol (SYMBICORT) 80-4.5 MCG/ACT inhaler Inhale 2 puffs into the lungs 2 (two) times daily.   Yes Historical Provider, MD  cholecalciferol (VITAMIN D) 1000 UNITS tablet Take 1,000 Units by mouth daily.     Yes Historical Provider, MD  clonazePAM (KLONOPIN) 0.5 MG tablet Take 0.5 mg by mouth daily.    Yes Historical Provider, MD  Dextromethorphan-Guaifenesin (TUSSIN DM PO) Take 1 tablet by mouth every 6 (six) hours as needed (cough and cold).    Yes Historical Provider, MD  fluticasone (FLONASE) 50 MCG/ACT nasal spray Place 2 sprays into the nose daily.     Yes Historical Provider, MD  folic acid (FOLVITE) 1 MG tablet Take 1 mg by mouth daily.     Yes  Historical Provider, MD  HYDROcodone-acetaminophen (VICODIN) 5-500 MG per tablet Take 1 tablet by mouth 2 (two) times daily as needed for pain.    Yes Historical Provider, MD  K-PHOS 500 MG tablet Take 500 mg by mouth daily. 04/02/16  Yes Historical Provider, MD  methotrexate (RHEUMATREX) 2.5 MG tablet Take 7.5 mg by mouth 2 (two) times a week. Take 3 tabs every Friday 3 tabs on Sunday   Yes Historical Provider, MD  phosphorus (K PHOS NEUTRAL) [id number] MG tablet Take by mouth 2 (two) times daily.   Yes Historical Provider, MD  predniSONE (DELTASONE) 10 MG tablet Take 10 mg by mouth daily. 04/27/16  Yes Historical Provider, MD  promethazine (PHENERGAN) 25 MG tablet Take 25 mg by mouth 2 (two) times daily as needed for nausea or vomiting.    Yes Historical Provider, MD  rosuvastatin (CRESTOR) 20 MG tablet Take 10 mg by mouth daily.     Yes Historical Provider, MD  Tiotropium Bromide Monohydrate (SPIRIVA RESPIMAT) 1.25 MCG/ACT AERS Inhale 2 puffs into the lungs daily.   Yes Historical Provider, MD  zoledronic acid (RECLAST) 5 MG/100ML SOLN Inject 5 mg into the vein once. Every 2 years    Yes Historical Provider, MD  sodium chloride 0.9 % SOLN 92 mL with golimumab 50 MG/4ML SOLN 100 mg Inject 100 mg into the vein once. Patient not taking: Reported on 04/29/2016 10/09/12   Hurley Cisco, MD    Physical Exam: BP 134/55 mmHg  Pulse 51  Temp(Src) 102.2 F (39 C) (Rectal)  Resp 22  Ht '5\' 10"'$  (1.778 m)  Wt 69.854 kg (154 lb)  BMI 22.10 kg/m2  SpO2 93%  General:  Frail , chronically ill, NAD Eyes: PERRL ENT: + thrush on tongue Neck: supple, no JVD Cardiovascular: sinus tachycardia Respiratory: no wheezing, bibasilar crackles ( likely from interstitial fibrosis) Abdomen: soft/ND/ND, positive bowel sounds Skin: no rash Musculoskeletal:  Chronic rheumatoid arthritis changes, No edema Psychiatric: calm/cooperative Neurologic: no focal findings            Labs on Admission:  Basic Metabolic  Panel:  Recent Labs Lab 04/29/16 1238  NA 137  K 3.4*  CL 106  CO2 19*  GLUCOSE 115*  BUN 34*  CREATININE 2.09*  CALCIUM 8.2*   Liver Function Tests: No results for input(s): AST, ALT, ALKPHOS, BILITOT, PROT, ALBUMIN in the last 168 hours. No results for input(s): LIPASE, AMYLASE in the last 168 hours. No results for input(s): AMMONIA in the last 168 hours. CBC:  Recent Labs Lab 04/29/16 1238  WBC 6.3  HGB 14.0  HCT 42.5  MCV 103.7*  PLT 88*   Cardiac Enzymes: No results for input(s): CKTOTAL, CKMB, CKMBINDEX, TROPONINI in the last 168  hours.  BNP (last 3 results) No results for input(s): BNP in the last 8760 hours.  ProBNP (last 3 results) No results for input(s): PROBNP in the last 8760 hours.  CBG:  Recent Labs Lab 04/29/16 1247  GLUCAP 114*    Radiological Exams on Admission: Dg Chest 2 View  04/29/2016  CLINICAL DATA:  Generalized weakness, fever, upper chest pain, dysphagia EXAM: CHEST  2 VIEW COMPARISON:  10/07/2015 FINDINGS: Cardiomediastinal silhouette is stable. Hyperinflation again noted. Stable emphysematous changes and fibrotic changes especially in lower lobes. No definite superimposed infiltrate or pulmonary edema. Bony thorax is unremarkable. IMPRESSION: No active disease. Stable hyperinflation, chronic interstitial prominence and fibrotic changes. Electronically Signed   By: Lahoma Crocker M.D.   On: 04/29/2016 13:31   Ct Soft Tissue Neck Wo Contrast  04/29/2016  CLINICAL DATA:  70 y/o M; generalized weakness with onset 11 days ago. He woke up on July 2nd began experiencing diffuse neck pain, upper chest pain, and dysphagia. EXAM: CT NECK WITHOUT CONTRAST TECHNIQUE: Multidetector CT imaging of the neck was performed following the standard protocol without intravenous contrast. COMPARISON:  MRI of the cervical spine dated 04/23/2016. Chest CT dated 10/07/2015. FINDINGS: Pharynx and larynx: No exophytic mucosal mass is identified. No inflammatory changes.  3 mm left-sided internal laryngocele. Salivary glands: Punctate calcifications within the left parotid gland probably represents sequelae prior parotiditis. L1 salivary glands are unremarkable. Thyroid: Unremarkable Lymph nodes: No lymphadenopathy. Vascular: Calcified plaque of the brachiocephalic and bilateral proximal subclavian arteries. Calcified plaque common carotid arteries bilaterally and of the carotid bifurcations moderate calcified plaque of the cavernous internal carotid arteries. Limited intracranial: Negative. Visualized orbits: Negative. Mastoids and visualized paranasal sinuses: Normally aerated. Leftward buckling of the anterior nasal septum may represent sequela of old trauma. Right concha bullosa. Hypoplastic right maxillary sinus. Skeleton: Cervical spondylosis greatest at the C5 and C7 levels is better characterized on the prior MRI of the cervical spine. No acute bony or articular abnormality is identified Upper chest: Severe emphysema of the lung apices and biapical calcified scarring. Stable 6 mm nodule in the left upper lobe series 7, image 5. IMPRESSION: 1. No findings explanation for diffuse neck pain is identified. 2. Severe emphysema of lung apices and left upper lobe nodule stable from prior chest CT. 3. Cervical spine degenerative changes as seen on prior cervical MRI. No acute bony or articular abnormality is identified. 4. Calcified plaque of great vessels and carotid bifurcations. 5. No lymphadenopathy or discrete cervical mass is identified on this noncontrast examination. Electronically Signed   By: Kristine Garbe M.D.   On: 04/29/2016 15:15    EKG: Independently reviewed. Sinus tachycardia, PAC's, no acute st/t changes, QTc 395  Assessment/Plan Present on Admission:  . Esophagitis   Dysphagia/odynophagia: with weight loss, fever, oral thrush, in a immunosuppressed individual who is also a life long smoker, differential including candida esophagitis, cmv,  malignancy. Continue topical nystatin , start iv diflucan, check cmv pcr, for now npo except meds and sip of water, GI consulted.  H/o HTN; now bp low normal, hold norvasc.  Fever , no leukocytosis, lactic acid wnl, ua+ bacteria, blood /urine culture pending, continue ivf, rocephin  Thrombocytopenia: unknown baseline, possible from acute infection, repeat cbc  Cr elevation: unknown baseline, continue hydration, abx, repeat bmp in am.  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 Will check b12/folate/tsh  RA continue home meds MTX and prednisone, will get PT once medically stable.  H/o copd/pulmonayr fibrosis, stable at baseline, no wheezing, does  has bibasilar crackles, on room air, continue symbicort /spiriva  DVT prophylaxis: lovenox  Consultants: [facility]  Code Status: full   Family Communication:  Patient , his wife and son in room  Disposition Plan: admit to med tele  Time spent: 56mns  Keyra Virella MD, PhD [facility] Pager [phone] 7PM-7AM, please contact night-coverage at [email or web address], password [email or web address]

## 2016-04-29 NOTE — Consult Note (Signed)
Consultation  Referring Provider:  Dr. Erlinda Hong    Primary Care Physician:  Jerlyn Ly, MD Primary Gastroenterologist: Dr. Olevia Perches        Reason for Consultation:  Dysphagia, Weight loss, Odynophagia            HPI:   Kenneth [name] is a 71 y.o. male with a history of COPD, advanced rheumatoid arthritis on chronic immunosuppression with Methotrexate and daily prednisone and pulmonary fibrosis who presented to the ER on 04/29/16 with a chief complaint of painful swallowing, decreased PO intake and generalized weakness.  Today, the patient is surrounded by his son and wife who do assist with this history. He explains that on July 2nd awoke with excruciating pain in his neck and throat, and "his whole body" after spending the day before fishing. At that time he tells me that it was "painful to swallow". He notes that this continued and he saw his primary care provider on July 9, he was diagnosed with degenerative disc disease , which was thought to be the source of his pain and has an appointment set with a chiropractor as well as a neurosurgeon, per his wife.   Patient expresses that since that time he has had increasing difficulty swallowing, noting that it is very painful to swallow large amounts of food, so he has been cutting his food in "very small bits", until yesterday and today, when it was even painful to swallow sips of water. Associated symptoms include a fever of 102.2 today, as well as some generalized abdominal pain, somewhat worse in his epigastrium. He denies any overt reflux or heartburn symptoms. He also relays a 7 pound weight loss since the beginning of this month.  Patient's wife does tell me that he has intermittent episodes of pain, which will send him to his bed where he lays for hours until it goes away. This is been going on for years, the patient has been disabled since 2007.  Likely unrelated the patient tells me he has had a change in bowel habits recently to 1 loose stool  per day ever since trying a different medication for his RA which he has since stopped.  Patient's social history is positive for tobacco use 1 pack per day for the past 47 years.  Patient denies nausea, vomiting or hematemesis.  Previous GI workup: 07/17/14-Colonoscopy, Dr Olevia Perches: Impression: 2 sessile polyps in the cecum and in the ascending colon 05/30/2003-Colonoscopy, Dr. Olevia Perches: Impression: normal, no polyps  Past Medical History  Diagnosis Date  . Gynecomastia   . Rheumatoid arthritis(714.0) dx 2000  . Situational hypertension   . Tobacco abuse   . Hyperlipidemia   . Anxiety   . Panic attack   . Elevated MCV   . Abnormal LFTs   . Nephrolithiasis   . CTS (carpal tunnel syndrome)     Left  . Gout   . Microhematuria   . DDD (degenerative disc disease)   . Ocular migraine   . Hearing loss   . COPD (chronic obstructive pulmonary disease) (HCC)     emphysema.  . Depression     mild  . Emphysema of lung [facility])     Past Surgical History  Procedure Laterality Date  . Appendectomy    . Colonoscopy    . Cystostomy w/ bladder biopsy  2006    Family History  Problem Relation Age of Onset  . Asthma Paternal Grandmother   . Lung cancer Father   . Lung cancer  Paternal Aunt   . Breast cancer Maternal Aunt   . Breast cancer Maternal Aunt   . Stomach cancer Maternal Uncle   . Colon cancer Neg Hx     Social History  Substance Use Topics  . Smoking status: Current Every Day Smoker -- 0.50 packs/day for 44 years    Types: Cigarettes  . Smokeless tobacco: Never Used  . Alcohol Use: No    Prior to Admission medications   Medication Sig Start Date End Date Taking? Authorizing Provider  acetaminophen (TYLENOL) 325 MG tablet Take 325 mg by mouth every 4 (four) hours as needed for mild pain.    Yes Historical Provider, MD  amLODipine (NORVASC) 5 MG tablet Take 2.5 mg by mouth daily.    Yes Historical Provider, MD  budesonide-formoterol (SYMBICORT) 80-4.5 MCG/ACT inhaler  Inhale 2 puffs into the lungs 2 (two) times daily.   Yes Historical Provider, MD  cholecalciferol (VITAMIN D) 1000 UNITS tablet Take 1,000 Units by mouth daily.     Yes Historical Provider, MD  clonazePAM (KLONOPIN) 0.5 MG tablet Take 0.5 mg by mouth daily.    Yes Historical Provider, MD  Dextromethorphan-Guaifenesin (TUSSIN DM PO) Take 1 tablet by mouth every 6 (six) hours as needed (cough and cold).    Yes Historical Provider, MD  fluticasone (FLONASE) 50 MCG/ACT nasal spray Place 2 sprays into the nose daily.     Yes Historical Provider, MD  folic acid (FOLVITE) 1 MG tablet Take 1 mg by mouth daily.     Yes Historical Provider, MD  HYDROcodone-acetaminophen (VICODIN) 5-500 MG per tablet Take 1 tablet by mouth 2 (two) times daily as needed for pain.    Yes Historical Provider, MD  K-PHOS 500 MG tablet Take 500 mg by mouth daily. 04/02/16  Yes Historical Provider, MD  methotrexate (RHEUMATREX) 2.5 MG tablet Take 7.5 mg by mouth 2 (two) times a week. Take 3 tabs every Friday 3 tabs on Sunday   Yes Historical Provider, MD  phosphorus (K PHOS NEUTRAL) [id number] MG tablet Take by mouth 2 (two) times daily.   Yes Historical Provider, MD  predniSONE (DELTASONE) 10 MG tablet Take 10 mg by mouth daily. 04/27/16  Yes Historical Provider, MD  promethazine (PHENERGAN) 25 MG tablet Take 25 mg by mouth 2 (two) times daily as needed for nausea or vomiting.    Yes Historical Provider, MD  rosuvastatin (CRESTOR) 20 MG tablet Take 10 mg by mouth daily.     Yes Historical Provider, MD  Tiotropium Bromide Monohydrate (SPIRIVA RESPIMAT) 1.25 MCG/ACT AERS Inhale 2 puffs into the lungs daily.   Yes Historical Provider, MD  zoledronic acid (RECLAST) 5 MG/100ML SOLN Inject 5 mg into the vein once. Every 2 years    Yes Historical Provider, MD  sodium chloride 0.9 % SOLN 92 mL with golimumab 50 MG/4ML SOLN 100 mg Inject 100 mg into the vein once. Patient not taking: Reported on 04/29/2016 10/09/12   Hurley Cisco, MD     Current Facility-Administered Medications  Medication Dose Route Frequency Provider Last Rate Last Dose  . 0.9 %  sodium chloride infusion   Intravenous Continuous Florencia Reasons, MD      . nicotine (NICODERM CQ - dosed in mg/24 hours) patch 21 mg  21 mg Transdermal Daily Florencia Reasons, MD       Current Outpatient Prescriptions  Medication Sig Dispense Refill  . acetaminophen (TYLENOL) 325 MG tablet Take 325 mg by mouth every 4 (four) hours as needed for mild pain.     Marland Kitchen  amLODipine (NORVASC) 5 MG tablet Take 2.5 mg by mouth daily.     . budesonide-formoterol (SYMBICORT) 80-4.5 MCG/ACT inhaler Inhale 2 puffs into the lungs 2 (two) times daily.    . cholecalciferol (VITAMIN D) 1000 UNITS tablet Take 1,000 Units by mouth daily.      . clonazePAM (KLONOPIN) 0.5 MG tablet Take 0.5 mg by mouth daily.     Marland Kitchen Dextromethorphan-Guaifenesin (TUSSIN DM PO) Take 1 tablet by mouth every 6 (six) hours as needed (cough and cold).     . fluticasone (FLONASE) 50 MCG/ACT nasal spray Place 2 sprays into the nose daily.      . folic acid (FOLVITE) 1 MG tablet Take 1 mg by mouth daily.      Marland Kitchen HYDROcodone-acetaminophen (VICODIN) 5-500 MG per tablet Take 1 tablet by mouth 2 (two) times daily as needed for pain.     Marland Kitchen K-PHOS 500 MG tablet Take 500 mg by mouth daily.  0  . methotrexate (RHEUMATREX) 2.5 MG tablet Take 7.5 mg by mouth 2 (two) times a week. Take 3 tabs every Friday 3 tabs on Sunday    . phosphorus (K PHOS NEUTRAL) [id number] MG tablet Take by mouth 2 (two) times daily.    . predniSONE (DELTASONE) 10 MG tablet Take 10 mg by mouth daily.  1  . promethazine (PHENERGAN) 25 MG tablet Take 25 mg by mouth 2 (two) times daily as needed for nausea or vomiting.     . rosuvastatin (CRESTOR) 20 MG tablet Take 10 mg by mouth daily.      . Tiotropium Bromide Monohydrate (SPIRIVA RESPIMAT) 1.25 MCG/ACT AERS Inhale 2 puffs into the lungs daily.    . zoledronic acid (RECLAST) 5 MG/100ML SOLN Inject 5 mg into the vein once. Every  2 years     . sodium chloride 0.9 % SOLN 92 mL with golimumab 50 MG/4ML SOLN 100 mg Inject 100 mg into the vein once. (Patient not taking: Reported on 04/29/2016) 1 each 0    Allergies as of 04/29/2016 - Review Complete 04/29/2016  Allergen Reaction Noted  . Aspirin Other (See Comments) 09/28/2011  . Naproxen sodium Other (See Comments) 09/28/2011     Review of Systems:    Constitutional: Positive for weight loss, fever, weakness and chills  HEENT: Eyes: Positive for the development of cataracts                Ears, Nose, Throat:  No change in hearing Skin: No rash or itching Cardiovascular: No chest pain, chest pressure or palpitations   Respiratory: Positive for shortness of breath and cough  Gastrointestinal: See HPI and otherwise negative Genitourinary: No dysuria or hematuria Neurological: Positive for headache and dizziness  Musculoskeletal: Positive for new throat pain Hematologic: No anemia or bleeding Psychiatric: No history of depression or anxiety   Physical Exam:  Vital signs in last 24 hours: Temp:  [99.2 F (37.3 C)-102.2 F (39 C)] 102.2 F (39 C) (07/13 1259) Pulse Rate:  [51-104] 51 (07/13 1430) Resp:  [22-29] 22 (07/13 1430) BP: (106-134)/(52-78) 134/55 mmHg (07/13 1430) SpO2:  [90 %-94 %] 93 % (07/13 1430) Weight:  [154 lb (69.854 kg)] 154 lb (69.854 kg) (07/13 1259)   General:   Pleasant Caucasian male appears to be in NAD, alert and cooperative Head:  Normocephalic and atraumatic. Eyes:   PEERL, EOMI. No icterus. Conjunctiva pink. Ears:  Normal auditory acuity. Neck:  Supple Throat: White residue present on tongue Oral cavity and pharynx without inflammation, swelling or  lesion.  Lungs: Respirations even and unlabored. Bibasilar crackles Heart: Normal S1, S2. No MRG. Regular rate and rhythm. No peripheral edema, cyanosis or pallor.  Abdomen:  Soft, nondistended, mild TTP generalized, somewhat worse in the epigastrium. No rebound or guarding. Normal  bowel sounds. No appreciable masses or hepatomegaly. Rectal:  Not performed.  Msk:  Symmetrical.  Peripheral pulses intact.  Extremities:  Without edema, no deformity or joint abnormality.  Neurologic:  Alert and  oriented x4;  grossly normal neurologically. Skin:   Dry and intact without significant lesions or rashes. Psychiatric: Oriented to person, place and time. Demonstrates good judgement and reason without abnormal affect or behaviors.   LAB RESULTS:  Recent Labs  04/29/16 1238  WBC 6.3  HGB 14.0  HCT 42.5  PLT 88*   BMET  Recent Labs  04/29/16 1238  NA 137  K 3.4*  CL 106  CO2 19*  GLUCOSE 115*  BUN 34*  CREATININE 2.09*  CALCIUM 8.2*   LFT No results for input(s): PROT, ALBUMIN, AST, ALT, ALKPHOS, BILITOT, BILIDIR, IBILI in the last 72 hours. PT/INR No results for input(s): LABPROT, INR in the last 72 hours.  STUDIES: Dg Chest 2 View  04/29/2016  CLINICAL DATA:  Generalized weakness, fever, upper chest pain, dysphagia EXAM: CHEST  2 VIEW COMPARISON:  10/07/2015 FINDINGS: Cardiomediastinal silhouette is stable. Hyperinflation again noted. Stable emphysematous changes and fibrotic changes especially in lower lobes. No definite superimposed infiltrate or pulmonary edema. Bony thorax is unremarkable. IMPRESSION: No active disease. Stable hyperinflation, chronic interstitial prominence and fibrotic changes. Electronically Signed   By: Lahoma Crocker M.D.   On: 04/29/2016 13:31   Ct Soft Tissue Neck Wo Contrast  04/29/2016  CLINICAL DATA:  70 y/o M; generalized weakness with onset 11 days ago. He woke up on July 2nd began experiencing diffuse neck pain, upper chest pain, and dysphagia. EXAM: CT NECK WITHOUT CONTRAST TECHNIQUE: Multidetector CT imaging of the neck was performed following the standard protocol without intravenous contrast. COMPARISON:  MRI of the cervical spine dated 04/23/2016. Chest CT dated 10/07/2015. FINDINGS: Pharynx and larynx: No exophytic mucosal mass  is identified. No inflammatory changes. 3 mm left-sided internal laryngocele. Salivary glands: Punctate calcifications within the left parotid gland probably represents sequelae prior parotiditis. L1 salivary glands are unremarkable. Thyroid: Unremarkable Lymph nodes: No lymphadenopathy. Vascular: Calcified plaque of the brachiocephalic and bilateral proximal subclavian arteries. Calcified plaque common carotid arteries bilaterally and of the carotid bifurcations moderate calcified plaque of the cavernous internal carotid arteries. Limited intracranial: Negative. Visualized orbits: Negative. Mastoids and visualized paranasal sinuses: Normally aerated. Leftward buckling of the anterior nasal septum may represent sequela of old trauma. Right concha bullosa. Hypoplastic right maxillary sinus. Skeleton: Cervical spondylosis greatest at the C5 and C7 levels is better characterized on the prior MRI of the cervical spine. No acute bony or articular abnormality is identified Upper chest: Severe emphysema of the lung apices and biapical calcified scarring. Stable 6 mm nodule in the left upper lobe series 7, image 5. IMPRESSION: 1. No findings explanation for diffuse neck pain is identified. 2. Severe emphysema of lung apices and left upper lobe nodule stable from prior chest CT. 3. Cervical spine degenerative changes as seen on prior cervical MRI. No acute bony or articular abnormality is identified. 4. Calcified plaque of great vessels and carotid bifurcations. 5. No lymphadenopathy or discrete cervical mass is identified on this noncontrast examination. Electronically Signed   By: Kristine Garbe M.D.   On: 04/29/2016 15:15  PREVIOUS ENDOSCOPIES:            See HPI   Impression / Plan:  Impression: 1. Odynophagia: Worsening over the past 2 weeks, consider relation to thrush, the patient is chronically immunosuppressed, so cannot rule out viral cause 2. Oral thrush: Consider relation to above 3.  Weight loss: Patient reports 7 pound weight loss over the past 2 weeks, likely related decreased intake from pain while eating 4. Epigastric pain: Mild on exam; consider musculoskeletal etiology versus gastritis versus other 5. History of smoking: 47 year history of at least 1 pack per day  Plan: 1. EGD scheduled for tomorrow with Dr. Loletha Carrow 2. Recommend patient remain nothing by mouth after midnight tonight. 3. Will replenish potassium tonight for anesthesia tomorrow 4. Will discuss above with Dr. Loletha Carrow, please await any further recommendations  Thank you for your kind consultation, we will continue to follow.  Lavone Nian Nichole Keltner  04/29/2016, 4:44 PM Pager #: [phone]

## 2016-04-30 ENCOUNTER — Encounter (HOSPITAL_COMMUNITY)
Admission: EM | Disposition: A | Discharge status: Home / Self Care | Payer: Self-pay | Source: Home / Self Care | Attending: Internal Medicine

## 2016-04-30 ENCOUNTER — Encounter (HOSPITAL_COMMUNITY): Payer: Self-pay | Admitting: *Deleted

## 2016-04-30 DIAGNOSIS — N179 Acute kidney failure, unspecified: Secondary | ICD-10-CM

## 2016-04-30 DIAGNOSIS — E860 Dehydration: Secondary | ICD-10-CM

## 2016-04-30 DIAGNOSIS — E43 Unspecified severe protein-calorie malnutrition: Secondary | ICD-10-CM | POA: Diagnosis present

## 2016-04-30 HISTORY — PX: ESOPHAGOGASTRODUODENOSCOPY: SHX5428

## 2016-04-30 LAB — URINE CULTURE: Culture: <10000 — AB

## 2016-04-30 LAB — CBC WITH DIFFERENTIAL/PLATELET
BASOS ABS: 0 10*3/uL (ref 0.0–0.1)
Basophils Relative: 0 %
EOS ABS: 0 10*3/uL (ref 0.0–0.7)
Eosinophils Relative: 0 %
HCT: 33 % — ABNORMAL LOW (ref 39.0–52.0)
HEMOGLOBIN: 10.7 g/dL — AB (ref 13.0–17.0)
LYMPHS ABS: 0.5 10*3/uL — AB (ref 0.7–4.0)
Lymphocytes Relative: 9 %
MCH: 33.8 pg (ref 26.0–34.0)
MCHC: 32.4 g/dL (ref 30.0–36.0)
MCV: 104.1 fL — AB (ref 78.0–100.0)
MONO ABS: 0.1 10*3/uL (ref 0.1–1.0)
MONOS PCT: 1 %
Neutro Abs: 5.3 10*3/uL (ref 1.7–7.7)
Neutrophils Relative %: 90 %
Platelets: 66 10*3/uL — ABNORMAL LOW (ref 150–400)
RBC: 3.17 MIL/uL — ABNORMAL LOW (ref 4.22–5.81)
RDW: 12.5 % (ref 11.5–15.5)
WBC: 5.9 10*3/uL (ref 4.0–10.5)

## 2016-04-30 LAB — COMPREHENSIVE METABOLIC PANEL
ALK PHOS: 53 U/L (ref 38–126)
ALT: 15 U/L — AB (ref 17–63)
ANION GAP: 7 (ref 5–15)
AST: 16 U/L (ref 15–41)
Albumin: 2.4 g/dL — ABNORMAL LOW (ref 3.5–5.0)
BILIRUBIN TOTAL: 0.5 mg/dL (ref 0.3–1.2)
BUN: 28 mg/dL — ABNORMAL HIGH (ref 6–20)
CALCIUM: 7 mg/dL — AB (ref 8.9–10.3)
CO2: 17 mmol/L — AB (ref 22–32)
CREATININE: 1.73 mg/dL — AB (ref 0.61–1.24)
Chloride: 115 mmol/L — ABNORMAL HIGH (ref 101–111)
GFR, EST AFRICAN AMERICAN: 45 mL/min — AB (ref >60)
GFR, EST NON AFRICAN AMERICAN: 39 mL/min — AB (ref >60)
Glucose, Bld: 96 mg/dL (ref 65–99)
Potassium: 3.7 mmol/L (ref 3.5–5.1)
Sodium: 139 mmol/L (ref 135–145)
TOTAL PROTEIN: 5.8 g/dL — AB (ref 6.5–8.1)

## 2016-04-30 LAB — VITAMIN B12: VITAMIN B 12: 302 pg/mL (ref 180–914)

## 2016-04-30 LAB — HIV ANTIBODY (ROUTINE TESTING W REFLEX): HIV SCREEN 4TH GENERATION: NONREACTIVE

## 2016-04-30 LAB — FOLATE: Folate: 26.2 ng/mL (ref >5.9)

## 2016-04-30 LAB — MAGNESIUM: MAGNESIUM: 1.9 mg/dL (ref 1.7–2.4)

## 2016-04-30 LAB — TSH: TSH: 0.302 u[IU]/mL — AB (ref 0.350–4.500)

## 2016-04-30 SURGERY — EGD (ESOPHAGOGASTRODUODENOSCOPY)
Anesthesia: Moderate Sedation

## 2016-04-30 MED ORDER — MIDAZOLAM HCL 10 MG/2ML IJ SOLN
INTRAMUSCULAR | Status: DC | PRN
  Administered 2016-04-30: 2 mg via INTRAVENOUS
  Administered 2016-04-30 (×2): 1 mg via INTRAVENOUS

## 2016-04-30 MED ORDER — DIPHENHYDRAMINE HCL 50 MG/ML IJ SOLN
INTRAMUSCULAR | Status: AC
  Filled 2016-04-30: qty 1

## 2016-04-30 MED ORDER — PANTOPRAZOLE SODIUM 40 MG PO TBEC
40.0000 mg | DELAYED_RELEASE_TABLET | Freq: Every day | ORAL | Status: DC
  Administered 2016-04-30 – 2016-05-01 (×2): 40 mg via ORAL
  Filled 2016-04-30 (×4): qty 1

## 2016-04-30 MED ORDER — FENTANYL CITRATE (PF) 100 MCG/2ML IJ SOLN
INTRAMUSCULAR | Status: AC
  Filled 2016-04-30: qty 2

## 2016-04-30 MED ORDER — SODIUM CHLORIDE 0.9 % IV SOLN: INTRAVENOUS | Status: DC

## 2016-04-30 MED ORDER — FENTANYL CITRATE (PF) 100 MCG/2ML IJ SOLN
INTRAMUSCULAR | Status: DC | PRN
  Administered 2016-04-30: 25 ug via INTRAVENOUS

## 2016-04-30 MED ORDER — BOOST / RESOURCE BREEZE PO LIQD
1.0000 | Freq: Two times a day (BID) | ORAL | Status: DC
  Administered 2016-05-01 (×2): 1 via ORAL

## 2016-04-30 MED ORDER — BUTAMBEN-TETRACAINE-BENZOCAINE 2-2-14 % EX AERO
INHALATION_SPRAY | CUTANEOUS | Status: DC | PRN
  Administered 2016-04-30: 1 via TOPICAL

## 2016-04-30 MED ORDER — MIDAZOLAM HCL 5 MG/ML IJ SOLN
INTRAMUSCULAR | Status: AC
  Filled 2016-04-30: qty 2

## 2016-04-30 NOTE — Progress Notes (Signed)
PROGRESS NOTE    Kenneth [name]  ELF:[id number] DOB: Jul 25, 1946 DOA: 04/29/2016 PCP: Jerlyn Ly, MD    Brief Narrative:  Dysphagia with dehydration. 70 yo male with RA. Negative EGD, follow on swallow evaluation.  Assessment & Plan:   Active Problems:   Esophagitis   Dysphagia   Odynophagia   Abnormal loss of weight  1. Cardiovascular. Dehydration. Will continue hydration with 1/2 normal saline at 75 cc/hr.   2. Pulmonary. No signs of aspiration, will continue to monitor oxymetry and supplemental 02 per [place].  3. Nephrology. AKI. Will continue to follow renal function and electrolytes, cr at 1,73 from 2.09, will continue hydration with 1/2 ns to avoid worsening hyperchloremia and acidosis. Hyperchloremic non gap acidosis. Will continue hydration with hypotonic saline.   4. Gastroenterology. Dysphagia Endoscopy with no intraluminal esophageal lesions, will follow on speech and swallow evaluation.  Gastritis. Will continue antiacid therapy with pantoprazole.  Oral thrush Will continue nystatin oral suspension.   5. Musculoskeletal. RA Will continue pain control with hydrocodone, steroids with prednisone.   Patient continue at moderate risk for worsening dehydration and renal failure.    DVT prophylaxis: lovenox Code Status: Full Family Communication: I spoke with patient's family at the bedside and all questions were addressed. Key information for patient's care was obtained. Disposition Plan: home  Consultants:   GI  Procedures:  EGD 07/14.   Antimicrobials:   Subjective: Patient with improve swallowing, no chest pain or dyspnea. Occasional cough while eating. No nausea or vomiting.   Objective: Filed Vitals:   04/30/16 1155 04/30/16 1200 04/30/16 1205 04/30/16 1210  BP:  114/84  120/63  Pulse: 74 81 75 74  Temp:      TempSrc:      Resp: '20 24  20  '$ Height:      Weight:      SpO2: 90% 96% 95% 94%    Intake/Output Summary (Last 24 hours) at 04/30/16  1255 Last data filed at 04/30/16 0623  Gross per 24 hour  Intake 1303.75 ml  Output    150 ml  Net 1153.75 ml   Filed Weights   04/29/16 1259 04/29/16 1815  Weight: 69.854 kg (154 lb) 68.72 kg (151 lb 8 oz)    Examination:  General exam: Appears calm and comfortable  Respiratory system: Clear to auscultation. Respiratory effort normal. No wheezing, rales or rhonchi. Cardiovascular system: S1 & S2 heard, RRR. No JVD, murmurs, rubs, gallops or clicks. No pedal edema. Gastrointestinal system: Abdomen is nondistended, soft and nontender. No organomegaly or masses felt. Normal bowel sounds heard. Central nervous system: Alert and oriented. No focal neurological deficits. Extremities: Symmetric 5 x 5 power. Skin: No rashes, lesions or ulcers     Data Reviewed: I have personally reviewed following labs and imaging studies  CBC:  Recent Labs Lab 04/29/16 1238 04/30/16 0504  WBC 6.3 5.9  NEUTROABS  --  5.3  HGB 14.0 10.7*  HCT 42.5 33.0*  MCV 103.7* 104.1*  PLT 88* 66*   Basic Metabolic Panel:  Recent Labs Lab 04/29/16 1238 04/30/16 0504  NA 137 139  K 3.4* 3.7  CL 106 115*  CO2 19* 17*  GLUCOSE 115* 96  BUN 34* 28*  CREATININE 2.09* 1.73*  CALCIUM 8.2* 7.0*  MG  --  1.9   GFR: Estimated Creatinine Clearance: 39.7 mL/min (by C-G formula based on Cr of 1.73). Liver Function Tests:  Recent Labs Lab 04/30/16 0504  AST 16  ALT 15*  ALKPHOS 53  BILITOT 0.5  PROT 5.8*  ALBUMIN 2.4*   No results for input(s): LIPASE, AMYLASE in the last 168 hours. No results for input(s): AMMONIA in the last 168 hours. Coagulation Profile: No results for input(s): INR, PROTIME in the last 168 hours. Cardiac Enzymes: No results for input(s): CKTOTAL, CKMB, CKMBINDEX, TROPONINI in the last 168 hours. BNP (last 3 results) No results for input(s): PROBNP in the last 8760 hours. HbA1C: No results for input(s): HGBA1C in the last 72 hours. CBG:  Recent Labs Lab  04/29/16 1247  GLUCAP 114*   Lipid Profile: No results for input(s): CHOL, HDL, LDLCALC, TRIG, CHOLHDL, LDLDIRECT in the last 72 hours. Thyroid Function Tests:  Recent Labs  04/30/16 0504  TSH 0.302*   Anemia Panel:  Recent Labs  04/30/16 0504  VITAMINB12 302  FOLATE 26.2   Sepsis Labs:  Recent Labs Lab 04/29/16 1310  LATICACIDVEN 1.72    Recent Results (from the past 240 hour(s))  Rapid strep screen     Status: None   Collection Time: 04/29/16  1:11 PM  Result Value Ref Range Status   Streptococcus, Group A Screen (Direct) NEGATIVE NEGATIVE Final    Comment: (NOTE) A Rapid Antigen test may result negative if the antigen level in the sample is below the detection level of this test. The FDA has not cleared this test as a stand-alone test therefore the rapid antigen negative result has reflexed to a Group A Strep culture.          Radiology Studies: Dg Chest 2 View  04/29/2016  CLINICAL DATA:  Generalized weakness, fever, upper chest pain, dysphagia EXAM: CHEST  2 VIEW COMPARISON:  10/07/2015 FINDINGS: Cardiomediastinal silhouette is stable. Hyperinflation again noted. Stable emphysematous changes and fibrotic changes especially in lower lobes. No definite superimposed infiltrate or pulmonary edema. Bony thorax is unremarkable. IMPRESSION: No active disease. Stable hyperinflation, chronic interstitial prominence and fibrotic changes. Electronically Signed   By: Lahoma Crocker M.D.   On: 04/29/2016 13:31   Ct Soft Tissue Neck Wo Contrast  04/29/2016  CLINICAL DATA:  70 y/o M; generalized weakness with onset 11 days ago. He woke up on July 2nd began experiencing diffuse neck pain, upper chest pain, and dysphagia. EXAM: CT NECK WITHOUT CONTRAST TECHNIQUE: Multidetector CT imaging of the neck was performed following the standard protocol without intravenous contrast. COMPARISON:  MRI of the cervical spine dated 04/23/2016. Chest CT dated 10/07/2015. FINDINGS: Pharynx and  larynx: No exophytic mucosal mass is identified. No inflammatory changes. 3 mm left-sided internal laryngocele. Salivary glands: Punctate calcifications within the left parotid gland probably represents sequelae prior parotiditis. L1 salivary glands are unremarkable. Thyroid: Unremarkable Lymph nodes: No lymphadenopathy. Vascular: Calcified plaque of the brachiocephalic and bilateral proximal subclavian arteries. Calcified plaque common carotid arteries bilaterally and of the carotid bifurcations moderate calcified plaque of the cavernous internal carotid arteries. Limited intracranial: Negative. Visualized orbits: Negative. Mastoids and visualized paranasal sinuses: Normally aerated. Leftward buckling of the anterior nasal septum may represent sequela of old trauma. Right concha bullosa. Hypoplastic right maxillary sinus. Skeleton: Cervical spondylosis greatest at the C5 and C7 levels is better characterized on the prior MRI of the cervical spine. No acute bony or articular abnormality is identified Upper chest: Severe emphysema of the lung apices and biapical calcified scarring. Stable 6 mm nodule in the left upper lobe series 7, image 5. IMPRESSION: 1. No findings explanation for diffuse neck pain is identified. 2. Severe emphysema of lung apices and left upper lobe nodule  stable from prior chest CT. 3. Cervical spine degenerative changes as seen on prior cervical MRI. No acute bony or articular abnormality is identified. 4. Calcified plaque of great vessels and carotid bifurcations. 5. No lymphadenopathy or discrete cervical mass is identified on this noncontrast examination. Electronically Signed   By: Kristine Garbe M.D.   On: 04/29/2016 15:15        Scheduled Meds: . clonazePAM  0.5 mg Oral Daily  . enoxaparin (LOVENOX) injection  40 mg Subcutaneous Q24H  . fluconazole (DIFLUCAN) IV  100 mg Intravenous Q24H  . fluticasone  2 spray Each Nare Daily  . folic acid  1 mg Oral Daily  .  mometasone-formoterol  2 puff Inhalation BID  . nicotine  21 mg Transdermal Daily  . nystatin  5 mL Oral QID  . pantoprazole  40 mg Oral Daily  . predniSONE  10 mg Oral Daily  . sodium chloride flush  3 mL Intravenous Q12H  . tiotropium  1 capsule Inhalation Daily   Continuous Infusions: . sodium chloride    . sodium chloride 75 mL/hr at 04/30/16 0502     LOS: 1 day       Tawni Millers, MD [facility] Pager [phone]  If 7PM-7AM, please contact night-coverage [email or web address] Password [facility] 04/30/2016, 12:55 PM

## 2016-04-30 NOTE — Op Note (Signed)
[facility] Patient Name: Kenneth [name] Procedure Date: 04/30/2016 MRN: [id number] Attending MD: Estill Cotta. Loletha Carrow , MD Date of Birth: 01-11-1946 CSN: [id number] Age: 70 Admit Type: Inpatient Procedure:                Upper GI endoscopy Indications:              Dysphagia, Odynophagia, Weight loss, Altered vocal                            quality Providers:                Mallie Mussel L. Loletha Carrow, MD, Elmer Ramp. Tilden Dome, RN, Elspeth Cho Tech., Technician Referring MD:              Medicines:                Midazolam 4 mg IV, Fentanyl 25 micrograms IV,                            Cetacaine spray Complications:            No immediate complications. Estimated Blood Loss:     Estimated blood loss: none. Procedure:                Pre-Anesthesia Assessment:                           - Prior to the procedure, a History and Physical                            was performed, and patient medications and                            allergies were reviewed. The patient's tolerance of                            previous anesthesia was also reviewed. The risks                            and benefits of the procedure and the sedation                            options and risks were discussed with the patient.                            All questions were answered, and informed consent                            was obtained. Prior Anticoagulants: The patient has                            taken no previous anticoagulant or antiplatelet                            agents.  ASA Grade Assessment: III - A patient with                            severe systemic disease. After reviewing the risks                            and benefits, the patient was deemed in                            satisfactory condition to undergo the procedure.                           After obtaining informed consent, the endoscope was                            passed under direct vision. Throughout  the                            procedure, the patient's blood pressure, pulse, and                            oxygen saturations were monitored continuously. The                            EG-[id number] ([id number]) scope was introduced through the                            mouth, and advanced to the second part of duodenum.                            The upper GI endoscopy was accomplished without                            difficulty. The patient tolerated the procedure                            well. Scope In: Scope Out: Findings:      The larynx was well-visualized and was normal (photographed).      There was moderate resistance passing the scope through the UES, but no       structural lesion such as stricture or mass. A mild Schatzki ring       (acquired) was found in the lower third of the esophagus. No mucosal       abnormalities such as candida.      Diffuse moderate hemorrhagic gastritis was found in the entire examined       stomach. This was biopsied with a cold forceps for histology (antrum and       body).      The cardia and gastric fundus were otherwise normal on retroflexion.      The examined duodenum was normal. Impression:               - Normal larynx.                           - Mild Schatzki  ring. Not significantly                            compromising esophageal caliber, thus no dilation                            performed.                           - Chronic gastritis with hemorrhage. Biopsied.                           - Normal examined duodenum.                           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 This may be causing                            extrinsic compression of UES and causing dysphagia                            and odynophagia, as no other cause seen on EGD. Moderate Sedation:      Moderate (conscious)  sedation was administered by the endoscopy nurse       and supervised by the endoscopist. The following parameters were       monitored: oxygen saturation, heart rate, blood pressure, respiratory       rate, EKG, adequacy of pulmonary ventilation, and response to care.       Total physician intraservice time was 13 minutes. Recommendation:           - Return patient to hospital ward for ongoing care.                           - Soft diet.                           - Medication reconciliation was performed, and a                            list of the patient's discharge medications was                            provided to the patient.                           - Use Protonix (pantoprazole) 40 mg PO daily.                           - Await pathology results. Procedure Code(s):        --- Professional ---  15726, Esophagogastroduodenoscopy, flexible,                            transoral; with biopsy, single or multiple Diagnosis Code(s):        --- Professional ---                           K22.2, Esophageal obstruction                           K29.51, Unspecified chronic gastritis with bleeding                           R13.10, Dysphagia, unspecified                           R63.4, Abnormal weight loss CPT copyright 2016 American Medical Association. All rights reserved. The codes documented in this report are preliminary and upon coder review may  be revised to meet current compliance requirements. Henry L. Loletha Carrow, MD 04/30/2016 11:53:23 AM This report has been signed electronically. Number of Addenda: 0

## 2016-04-30 NOTE — Progress Notes (Signed)
Patient with run of SVT. Patient asymptomatic. Resting comfortably. NP on call notified. No new orders placed. Will continue to monitor closely

## 2016-04-30 NOTE — Evaluation (Signed)
Physical Therapy Evaluation Patient Details Name: Kenneth [name] MRN: [id number] DOB: [birth year] Today's Date: 04/30/2016   History of Present Illness  Pt is a 70 year old with h/o COPD (not on home 02, not steroids dependent), advanced rheumatoid arthritis on chronic immunosuppression currently on methotrexate and daily prednisone, smoking,  pulmonary fibrosis and admitted with jaw/neck/anterior chest pain; diagnosed with Esophagitis and dehydration  Clinical Impression  Patient evaluated by Physical Therapy with no further acute PT needs identified. All education has been completed and the patient has no further questions.  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  Pt just started seeing a chiropractor and spouse and pt wish to continue that plan of care.  Discussed benefits of outpatient PT for his back as well and recommended OP PT if chiropractic care does not relieve his symptoms. See below for any follow-up Physical Therapy or equipment needs. PT is signing off. Thank you for this referral.     Follow Up Recommendations Outpatient PT    Equipment Recommendations  None recommended by PT    Recommendations for Other Services       Precautions / Restrictions Precautions Precautions: Fall      Mobility  Bed Mobility Overal bed mobility: Needs Assistance Bed Mobility: Supine to Sit     Supine to sit: Supervision        Transfers Overall transfer level: Needs assistance   Transfers: Sit to/from Stand Sit to Stand: Supervision            Ambulation/Gait Ambulation/Gait assistance: Supervision;Min guard Ambulation Distance (Feet): 350 Feet Assistive device: Straight cane Gait Pattern/deviations: Step-through pattern     General Gait Details: slow but steady gait with cane, pt describes short distance and shuffling pattern at home lately due to weakness and chronic back  pain, family reports great improvement in gait pattern today compared to prior to admission  Stairs            Wheelchair Mobility    Modified Rankin (Stroke Patients Only)       Balance Overall balance assessment:  (near fall at home prior to arrival, sliding off bed)                                           Pertinent Vitals/Pain Pain Assessment: 0-10 Pain Score: 3  Pain Location: L posterior shoulder (chronic neck pain from DJD) Pain Descriptors / Indicators: Sore Pain Intervention(s): Limited activity within patient's tolerance;Monitored during session    Home Living Family/patient expects to be discharged to:: Private residence Living Arrangements: Spouse/significant other   Type of Home: House Home Access: Stairs to enter   Technical brewer of Steps: 2 Home Layout: Able to live on main level with bedroom/bathroom;Laundry or work area in [place]: Kasandra Knudsen - single point      Prior Function Level of Independence: Independent               Journalist, newspaper        Extremity/Trunk Assessment               Lower Extremity Assessment: Generalized weakness         Communication   Communication: No difficulties  Cognition Arousal/Alertness: Awake/alert Behavior During Therapy: WFL for tasks assessed/performed Overall Cognitive Status:  Within Functional Limits for tasks assessed                      General Comments      Exercises        Assessment/Plan    PT Assessment All further PT needs can be met in the next venue of care  PT Diagnosis Difficulty walking;Generalized weakness   PT Problem List Decreased strength;Decreased mobility;Pain  PT Treatment Interventions     PT Goals (Current goals can be found in the Care Plan section) Acute Rehab PT Goals PT Goal Formulation: All assessment and education complete, DC therapy    Frequency     Barriers to discharge        Co-evaluation                End of Session Equipment Utilized During Treatment: Gait belt Activity Tolerance: Patient tolerated treatment well Patient left: in bed;with call bell/phone within reach;with family/visitor present Nurse Communication: Mobility status         Time: 9806-9996 PT Time Calculation (min) (ACUTE ONLY): 32 min   Charges:   PT Evaluation $PT Eval Low Complexity: 1 Procedure     PT G Codes:        Jenesis Martin,KATHrine E 04/30/2016, 4:37 PM Carmelia Bake, PT, DPT 04/30/2016 Pager: [phone]

## 2016-04-30 NOTE — Interval H&P Note (Signed)
History and Physical Interval Note:  04/30/2016 11:15 AM  Kenneth [name]  has presented today for surgery, with the diagnosis of Odynophagia  The various methods of treatment have been discussed with the patient and family. After consideration of risks, benefits and other options for treatment, the patient has consented to  Procedure(s): ESOPHAGOGASTRODUODENOSCOPY (EGD) (N/A) as a surgical intervention .  The patient's history has been reviewed, patient examined, no change in status, stable for surgery.  I have reviewed the patient's chart and labs.  Questions were answered to the patient's satisfaction.     Kenneth [name]

## 2016-04-30 NOTE — Evaluation (Signed)
Clinical/Bedside Swallow Evaluation Patient Details  Name: Kenneth [name] MRN: [id number] Date of Birth: [birth year]  Today's Date: 04/30/2016 Time: SLP Start Time (ACUTE ONLY): 71 SLP Stop Time (ACUTE ONLY): 1622 SLP Time Calculation (min) (ACUTE ONLY): 21 min  Past Medical History:  Past Medical History  Diagnosis Date  . Gynecomastia   . Rheumatoid arthritis(714.0) dx 2000  . Situational hypertension   . Tobacco abuse   . Hyperlipidemia   . Anxiety   . Panic attack   . Elevated MCV   . Abnormal LFTs   . Nephrolithiasis   . CTS (carpal tunnel syndrome)     Left  . Gout   . Microhematuria   . DDD (degenerative disc disease)   . Ocular migraine   . Hearing loss   . COPD (chronic obstructive pulmonary disease) (HCC)     emphysema.  . Depression     mild  . Emphysema of lung [facility])    Past Surgical History:  Past Surgical History  Procedure Laterality Date  . Appendectomy    . Colonoscopy    . Cystostomy w/ bladder biopsy  2006   HPI:  Life long smoker , with h/o copd (not on home 02, not steroids dependent), h/o advanced rheumatoid arthritis on chronic immunosuppression currently on methotrexate and daily prednisone, h/o pulmonary fibrosis who recently developed jaw/neck/anterior chest pain, he underwent MRI of his neck was in the process of getting referred to chiropractice for DJD, however, his symptom progressed , he developed difficulty swallowing with Solid and liquid, reported pain with swallowing and weight loss, this morning, he looked weak and started drooling, wife got him to chiropractioner's office who is concerned about him being sick, he is referred to [facility] ED; pt c/o globus sensation intermittently and odynophagia secondary to candida; EGD completed earlier this date; pt stated he had no difficulty with consuming lunch tray after procedure.   Assessment / Plan / Recommendation Clinical Impression   Pt without overt s/s of aspiration noted during BSE; hoarse  vocal quality prior to intake and s/s of possible esophageal dysphagia noted during assessment including increased belching and globus sensation during meals.  EGD completed, but esophageal assessment may be considered to r/o GERD if MD agrees.  ST will s/o as pt/family educated re: esophageal precautions and in agreement to follow through with precautions during and after po intake.    Aspiration Risk  Mild aspiration risk    Diet Recommendation   Soft diet/thin liquids with esophageal precautions  Medication Administration: Whole meds with liquid    Other  Recommendations Recommended Consults: Consider esophageal assessment Oral Care Recommendations: Oral care BID   Follow up Recommendations  None    Frequency and Duration   n/a         Prognosis Prognosis for Safe Diet Advancement: Good      Swallow Study   General Date of Onset: 04/29/16 HPI: Life long smoker , with h/o copd (not on home 02, not steroids dependent), h/o advanced rheumatoid arthritis on chronic immunosuppression currently on methotrexate and daily prednisone, h/o pulmonary fibrosis who recently developed jaw/neck/anterior chest pain, he underwent MRI of his neck was in the process of getting referred to chiropractice for DJD, however, his symptom progressed , he developed difficulty swallowing with Solid and liquid, reported pain with swallowing and weight loss, this morning, he looked weak and started drooling, wife got him to chiropractioner's office who is concerned about him being sick, he is referred to [facility] ED.  Type of Study: Bedside Swallow Evaluation Diet Prior to this Study: Dysphagia 3 (soft);Thin liquids Temperature Spikes Noted: No Respiratory Status: Room air Behavior/Cognition: Alert;Cooperative;Pleasant mood Oral Cavity Assessment: Other (comment) (thrush resolving) Oral Care Completed by SLP: No Oral Cavity - Dentition: Edentulous Vision: Functional for self-feeding Self-Feeding Abilities: Able  to feed self Patient Positioning: Upright in bed Baseline Vocal Quality: Hoarse Volitional Cough: Strong Volitional Swallow: Able to elicit    Oral/Motor/Sensory Function Overall Oral Motor/Sensory Function: Within functional limits   Ice Chips Ice chips: Within functional limits Presentation: Spoon   Thin Liquid Thin Liquid: Within functional limits Presentation: Cup;Straw    Nectar Thick Nectar Thick Liquid: Not tested   Honey Thick Honey Thick Liquid: Not tested   Puree Puree: Within functional limits Presentation: Self Fed   Solid      Solid: Within functional limits Presentation: Self Fed    Functional Assessment Tool Used: NOMS Functional Limitations: Swallowing Swallow Current Status (R7116): At least 1 percent but less than 20 percent impaired, limited or restricted Swallow Goal Status [phone]): At least 1 percent but less than 20 percent impaired, limited or restricted Swallow Discharge Status [phone]): At least 1 percent but less than 20 percent impaired, limited or restricted   Jameeka Marcy,PAT, M.S., CCC-SLP 04/30/2016,4:36 PM

## 2016-04-30 NOTE — Care Management Note (Signed)
Case Management Note  Patient Details  Name: Kenneth [name] MRN: [id number] Date of Birth: 25-Jul-1946  Subjective/Objective:  70 y/o m admitted w/Esophagitis. From home.PT cons-await recc.                  Action/Plan:d/c plan home.   Expected Discharge Date:   (unknown)               Expected Discharge Plan:  Home/Self Care  In-House Referral:     Discharge planning Services  CM Consult  Post Acute Care Choice:    Choice offered to:     DME Arranged:    DME Agency:     HH Arranged:    HH Agency:     Status of Service:  In process, will continue to follow  If discussed at Long Length of Stay Meetings, dates discussed:    Additional Comments:  Dessa Phi, RN 04/30/2016, 2:46 PM

## 2016-04-30 NOTE — Progress Notes (Signed)
Initial Nutrition Assessment  DOCUMENTATION CODES:   Severe malnutrition in context of acute illness/injury  INTERVENTION:  - Will order Boost Breeze BID, each supplement provides 250 kcal and 9 grams of protein - Encourage PO intakes of meals and supplements. - RD will continue to monitor for needs.  NUTRITION DIAGNOSIS:   Inadequate oral intake related to acute illness as evidenced by per patient/family report.  GOAL:   Patient will meet greater than or equal to 90% of their needs  MONITOR:   PO intake, Supplement acceptance, Weight trends, Labs, I & O's  REASON FOR ASSESSMENT:   Malnutrition Screening Tool  ASSESSMENT:   70 y.o. male with a history of COPD, advanced rheumatoid arthritis on chronic immunosuppression with Methotrexate and daily prednisone and pulmonary fibrosis who presented to the ER on 04/29/16 with a chief complaint of painful swallowing, decreased PO intake and generalized weakness.  Pt seen for MST. BMI indicates normal weight. Pt's diet advanced from NPO to Soft at 1140 today and pt's wife, who is at bedside, reports pt ate 100% of grilled cheese, vegetables, and chocolate cake. Pt denies pain or difficulty with swallowing during this meal and states he was given medication prior to meal to coat throat. He states that pain and difficulty with swallowing began on 04/18/16. Wife reports that pt had outpatient MD appointment 04/21/16 for lab draw and that mention was made about swallowing; xray and MRI complete at that time and showed DJD, per wife's report.   Pt states that pain and difficulty with swallowing worsened over the past 2 weeks and that the 3 days PTA he was unable to eat or drink anything due to pain. Pt states that pain was not present if he was not eating or drinking.   S/p EGD with notes indicating findings of mild Schatzki ring in lower third of esophagus. Notes also indicate pt with oral thrush.  Pt reports that weight at time of onset of  symptoms was 158-160 lbs. Based on CBW, this indicates 7-9 lb weight loss (4.4-5.6% body weight) in the past 2 weeks which is significant for time frame. Physical assessment shows moderate muscle and mild to moderate fat wasting.   Will order Boost Breeze BID to supplement and monitor for additional needs at follow-up. Not meeting needs PTA. Pt and wife report that they were informed that SLP to work with pt prior to d/c. Medications reviewed; 100 mg Diflucan/day, 1 mg folic acid/day, 824235 units Mycostatin QID, 40 mg Protonix/day, 10 mg oral Prednisone/day. Labs reviewed; Cl: 115 mmol/L, BUN: 28 mg/dL, creatinine: 1.73 mg/dL, Ca: 7 mg/dL, GFR: 39 mL/min.  IVF: NS @ 75 mL/hr.   Diet Order:  DIET SOFT Room service appropriate?: Yes; Fluid consistency:: Thin  Skin:  Reviewed, no issues  Last BM:  PTA  Height:   Ht Readings from Last 1 Encounters:  04/29/16 '5\' 10"'$  (1.778 m)    Weight:   Wt Readings from Last 1 Encounters:  04/29/16 151 lb 8 oz (68.72 kg)    Ideal Body Weight:  75.45 kg (kg)  BMI:  Body mass index is 21.74 kg/(m^2).  Estimated Nutritional Needs:   Kcal:  1650-1850  Protein:  70-80 grams  Fluid:  2 L/day  EDUCATION NEEDS:   No education needs identified at this time     Jarome Matin, MS, RD, LDN Inpatient Clinical Dietitian Pager # [phone] After hours/weekend pager # [phone]

## 2016-04-30 NOTE — H&P (View-Only) (Signed)
Consultation  Referring Provider:  Dr. Erlinda Hong    Primary Care Physician:  Jerlyn Ly, MD Primary Gastroenterologist: Dr. Olevia Perches        Reason for Consultation:  Dysphagia, Weight loss, Odynophagia            HPI:   Kenneth [name] is a 70 y.o. male with a history of COPD, advanced rheumatoid arthritis on chronic immunosuppression with Methotrexate and daily prednisone and pulmonary fibrosis who presented to the ER on 04/29/16 with a chief complaint of painful swallowing, decreased PO intake and generalized weakness.  Today, the patient is surrounded by his son and wife who do assist with this history. He explains that on July 2nd awoke with excruciating pain in his neck and throat, and "his whole body" after spending the day before fishing. At that time he tells me that it was "painful to swallow". He notes that this continued and he saw his primary care provider on July 9, he was diagnosed with degenerative disc disease , which was thought to be the source of his pain and has an appointment set with a chiropractor as well as a neurosurgeon, per his wife.   Patient expresses that since that time he has had increasing difficulty swallowing, noting that it is very painful to swallow large amounts of food, so he has been cutting his food in "very small bits", until yesterday and today, when it was even painful to swallow sips of water. Associated symptoms include a fever of 102.2 today, as well as some generalized abdominal pain, somewhat worse in his epigastrium. He denies any overt reflux or heartburn symptoms. He also relays a 7 pound weight loss since the beginning of this month.  Patient's wife does tell me that he has intermittent episodes of pain, which will send him to his bed where he lays for hours until it goes away. This is been going on for years, the patient has been disabled since 2007.  Likely unrelated the patient tells me he has had a change in bowel habits recently to 1 loose stool  per day ever since trying a different medication for his RA which he has since stopped.  Patient's social history is positive for tobacco use 1 pack per day for the past 47 years.  Patient denies nausea, vomiting or hematemesis.  Previous GI workup: 07/17/14-Colonoscopy, Dr Olevia Perches: Impression: 2 sessile polyps in the cecum and in the ascending colon 05/30/2003-Colonoscopy, Dr. Olevia Perches: Impression: normal, no polyps  Past Medical History  Diagnosis Date  . Gynecomastia   . Rheumatoid arthritis(714.0) dx 2000  . Situational hypertension   . Tobacco abuse   . Hyperlipidemia   . Anxiety   . Panic attack   . Elevated MCV   . Abnormal LFTs   . Nephrolithiasis   . CTS (carpal tunnel syndrome)     Left  . Gout   . Microhematuria   . DDD (degenerative disc disease)   . Ocular migraine   . Hearing loss   . COPD (chronic obstructive pulmonary disease) (HCC)     emphysema.  . Depression     mild  . Emphysema of lung [facility])     Past Surgical History  Procedure Laterality Date  . Appendectomy    . Colonoscopy    . Cystostomy w/ bladder biopsy  2006    Family History  Problem Relation Age of Onset  . Asthma Paternal Grandmother   . Lung cancer Father   . Lung cancer  Paternal Aunt   . Breast cancer Maternal Aunt   . Breast cancer Maternal Aunt   . Stomach cancer Maternal Uncle   . Colon cancer Neg Hx     Social History  Substance Use Topics  . Smoking status: Current Every Day Smoker -- 0.50 packs/day for 44 years    Types: Cigarettes  . Smokeless tobacco: Never Used  . Alcohol Use: No    Prior to Admission medications   Medication Sig Start Date End Date Taking? Authorizing Provider  acetaminophen (TYLENOL) 325 MG tablet Take 325 mg by mouth every 4 (four) hours as needed for mild pain.    Yes Historical Provider, MD  amLODipine (NORVASC) 5 MG tablet Take 2.5 mg by mouth daily.    Yes Historical Provider, MD  budesonide-formoterol (SYMBICORT) 80-4.5 MCG/ACT inhaler  Inhale 2 puffs into the lungs 2 (two) times daily.   Yes Historical Provider, MD  cholecalciferol (VITAMIN D) 1000 UNITS tablet Take 1,000 Units by mouth daily.     Yes Historical Provider, MD  clonazePAM (KLONOPIN) 0.5 MG tablet Take 0.5 mg by mouth daily.    Yes Historical Provider, MD  Dextromethorphan-Guaifenesin (TUSSIN DM PO) Take 1 tablet by mouth every 6 (six) hours as needed (cough and cold).    Yes Historical Provider, MD  fluticasone (FLONASE) 50 MCG/ACT nasal spray Place 2 sprays into the nose daily.     Yes Historical Provider, MD  folic acid (FOLVITE) 1 MG tablet Take 1 mg by mouth daily.     Yes Historical Provider, MD  HYDROcodone-acetaminophen (VICODIN) 5-500 MG per tablet Take 1 tablet by mouth 2 (two) times daily as needed for pain.    Yes Historical Provider, MD  K-PHOS 500 MG tablet Take 500 mg by mouth daily. 04/02/16  Yes Historical Provider, MD  methotrexate (RHEUMATREX) 2.5 MG tablet Take 7.5 mg by mouth 2 (two) times a week. Take 3 tabs every Friday 3 tabs on Sunday   Yes Historical Provider, MD  phosphorus (K PHOS NEUTRAL) 155-852-130 MG tablet Take by mouth 2 (two) times daily.   Yes Historical Provider, MD  predniSONE (DELTASONE) 10 MG tablet Take 10 mg by mouth daily. 04/27/16  Yes Historical Provider, MD  promethazine (PHENERGAN) 25 MG tablet Take 25 mg by mouth 2 (two) times daily as needed for nausea or vomiting.    Yes Historical Provider, MD  rosuvastatin (CRESTOR) 20 MG tablet Take 10 mg by mouth daily.     Yes Historical Provider, MD  Tiotropium Bromide Monohydrate (SPIRIVA RESPIMAT) 1.25 MCG/ACT AERS Inhale 2 puffs into the lungs daily.   Yes Historical Provider, MD  zoledronic acid (RECLAST) 5 MG/100ML SOLN Inject 5 mg into the vein once. Every 2 years    Yes Historical Provider, MD  sodium chloride 0.9 % SOLN 92 mL with golimumab 50 MG/4ML SOLN 100 mg Inject 100 mg into the vein once. Patient not taking: Reported on 04/29/2016 10/09/12   Hurley Cisco, MD     Current Facility-Administered Medications  Medication Dose Route Frequency Provider Last Rate Last Dose  . 0.9 %  sodium chloride infusion   Intravenous Continuous Florencia Reasons, MD      . nicotine (NICODERM CQ - dosed in mg/24 hours) patch 21 mg  21 mg Transdermal Daily Florencia Reasons, MD       Current Outpatient Prescriptions  Medication Sig Dispense Refill  . acetaminophen (TYLENOL) 325 MG tablet Take 325 mg by mouth every 4 (four) hours as needed for mild pain.     Marland Kitchen  amLODipine (NORVASC) 5 MG tablet Take 2.5 mg by mouth daily.     . budesonide-formoterol (SYMBICORT) 80-4.5 MCG/ACT inhaler Inhale 2 puffs into the lungs 2 (two) times daily.    . cholecalciferol (VITAMIN D) 1000 UNITS tablet Take 1,000 Units by mouth daily.      . clonazePAM (KLONOPIN) 0.5 MG tablet Take 0.5 mg by mouth daily.     Marland Kitchen Dextromethorphan-Guaifenesin (TUSSIN DM PO) Take 1 tablet by mouth every 6 (six) hours as needed (cough and cold).     . fluticasone (FLONASE) 50 MCG/ACT nasal spray Place 2 sprays into the nose daily.      . folic acid (FOLVITE) 1 MG tablet Take 1 mg by mouth daily.      Marland Kitchen HYDROcodone-acetaminophen (VICODIN) 5-500 MG per tablet Take 1 tablet by mouth 2 (two) times daily as needed for pain.     Marland Kitchen K-PHOS 500 MG tablet Take 500 mg by mouth daily.  0  . methotrexate (RHEUMATREX) 2.5 MG tablet Take 7.5 mg by mouth 2 (two) times a week. Take 3 tabs every Friday 3 tabs on Sunday    . phosphorus (K PHOS NEUTRAL) 155-852-130 MG tablet Take by mouth 2 (two) times daily.    . predniSONE (DELTASONE) 10 MG tablet Take 10 mg by mouth daily.  1  . promethazine (PHENERGAN) 25 MG tablet Take 25 mg by mouth 2 (two) times daily as needed for nausea or vomiting.     . rosuvastatin (CRESTOR) 20 MG tablet Take 10 mg by mouth daily.      . Tiotropium Bromide Monohydrate (SPIRIVA RESPIMAT) 1.25 MCG/ACT AERS Inhale 2 puffs into the lungs daily.    . zoledronic acid (RECLAST) 5 MG/100ML SOLN Inject 5 mg into the vein once. Every  2 years     . sodium chloride 0.9 % SOLN 92 mL with golimumab 50 MG/4ML SOLN 100 mg Inject 100 mg into the vein once. (Patient not taking: Reported on 04/29/2016) 1 each 0    Allergies as of 04/29/2016 - Review Complete 04/29/2016  Allergen Reaction Noted  . Aspirin Other (See Comments) 09/28/2011  . Naproxen sodium Other (See Comments) 09/28/2011     Review of Systems:    Constitutional: Positive for weight loss, fever, weakness and chills  HEENT: Eyes: Positive for the development of cataracts                Ears, Nose, Throat:  No change in hearing Skin: No rash or itching Cardiovascular: No chest pain, chest pressure or palpitations   Respiratory: Positive for shortness of breath and cough  Gastrointestinal: See HPI and otherwise negative Genitourinary: No dysuria or hematuria Neurological: Positive for headache and dizziness  Musculoskeletal: Positive for new throat pain Hematologic: No anemia or bleeding Psychiatric: No history of depression or anxiety   Physical Exam:  Vital signs in last 24 hours: Temp:  [99.2 F (37.3 C)-102.2 F (39 C)] 102.2 F (39 C) (07/13 1259) Pulse Rate:  [51-104] 51 (07/13 1430) Resp:  [22-29] 22 (07/13 1430) BP: (106-134)/(52-78) 134/55 mmHg (07/13 1430) SpO2:  [90 %-94 %] 93 % (07/13 1430) Weight:  [154 lb (69.854 kg)] 154 lb (69.854 kg) (07/13 1259)   General:   Pleasant Caucasian male appears to be in NAD, alert and cooperative Head:  Normocephalic and atraumatic. Eyes:   PEERL, EOMI. No icterus. Conjunctiva pink. Ears:  Normal auditory acuity. Neck:  Supple Throat: White residue present on tongue Oral cavity and pharynx without inflammation, swelling or  lesion.  Lungs: Respirations even and unlabored. Bibasilar crackles Heart: Normal S1, S2. No MRG. Regular rate and rhythm. No peripheral edema, cyanosis or pallor.  Abdomen:  Soft, nondistended, mild TTP generalized, somewhat worse in the epigastrium. No rebound or guarding. Normal  bowel sounds. No appreciable masses or hepatomegaly. Rectal:  Not performed.  Msk:  Symmetrical.  Peripheral pulses intact.  Extremities:  Without edema, no deformity or joint abnormality.  Neurologic:  Alert and  oriented x4;  grossly normal neurologically. Skin:   Dry and intact without significant lesions or rashes. Psychiatric: Oriented to person, place and time. Demonstrates good judgement and reason without abnormal affect or behaviors.   LAB RESULTS:  Recent Labs  04/29/16 1238  WBC 6.3  HGB 14.0  HCT 42.5  PLT 88*   BMET  Recent Labs  04/29/16 1238  NA 137  K 3.4*  CL 106  CO2 19*  GLUCOSE 115*  BUN 34*  CREATININE 2.09*  CALCIUM 8.2*   LFT No results for input(s): PROT, ALBUMIN, AST, ALT, ALKPHOS, BILITOT, BILIDIR, IBILI in the last 72 hours. PT/INR No results for input(s): LABPROT, INR in the last 72 hours.  STUDIES: Dg Chest 2 View  04/29/2016  CLINICAL DATA:  Generalized weakness, fever, upper chest pain, dysphagia EXAM: CHEST  2 VIEW COMPARISON:  10/07/2015 FINDINGS: Cardiomediastinal silhouette is stable. Hyperinflation again noted. Stable emphysematous changes and fibrotic changes especially in lower lobes. No definite superimposed infiltrate or pulmonary edema. Bony thorax is unremarkable. IMPRESSION: No active disease. Stable hyperinflation, chronic interstitial prominence and fibrotic changes. Electronically Signed   By: Lahoma Crocker M.D.   On: 04/29/2016 13:31   Ct Soft Tissue Neck Wo Contrast  04/29/2016  CLINICAL DATA:  70 y/o M; generalized weakness with onset 11 days ago. He woke up on July 2nd began experiencing diffuse neck pain, upper chest pain, and dysphagia. EXAM: CT NECK WITHOUT CONTRAST TECHNIQUE: Multidetector CT imaging of the neck was performed following the standard protocol without intravenous contrast. COMPARISON:  MRI of the cervical spine dated 04/23/2016. Chest CT dated 10/07/2015. FINDINGS: Pharynx and larynx: No exophytic mucosal mass  is identified. No inflammatory changes. 3 mm left-sided internal laryngocele. Salivary glands: Punctate calcifications within the left parotid gland probably represents sequelae prior parotiditis. L1 salivary glands are unremarkable. Thyroid: Unremarkable Lymph nodes: No lymphadenopathy. Vascular: Calcified plaque of the brachiocephalic and bilateral proximal subclavian arteries. Calcified plaque common carotid arteries bilaterally and of the carotid bifurcations moderate calcified plaque of the cavernous internal carotid arteries. Limited intracranial: Negative. Visualized orbits: Negative. Mastoids and visualized paranasal sinuses: Normally aerated. Leftward buckling of the anterior nasal septum may represent sequela of old trauma. Right concha bullosa. Hypoplastic right maxillary sinus. Skeleton: Cervical spondylosis greatest at the C5 and C7 levels is better characterized on the prior MRI of the cervical spine. No acute bony or articular abnormality is identified Upper chest: Severe emphysema of the lung apices and biapical calcified scarring. Stable 6 mm nodule in the left upper lobe series 7, image 5. IMPRESSION: 1. No findings explanation for diffuse neck pain is identified. 2. Severe emphysema of lung apices and left upper lobe nodule stable from prior chest CT. 3. Cervical spine degenerative changes as seen on prior cervical MRI. No acute bony or articular abnormality is identified. 4. Calcified plaque of great vessels and carotid bifurcations. 5. No lymphadenopathy or discrete cervical mass is identified on this noncontrast examination. Electronically Signed   By: Kristine Garbe M.D.   On: 04/29/2016 15:15  PREVIOUS ENDOSCOPIES:            See HPI   Impression / Plan:  Impression: 1. Odynophagia: Worsening over the past 2 weeks, consider relation to thrush, the patient is chronically immunosuppressed, so cannot rule out viral cause 2. Oral thrush: Consider relation to above 3.  Weight loss: Patient reports 7 pound weight loss over the past 2 weeks, likely related decreased intake from pain while eating 4. Epigastric pain: Mild on exam; consider musculoskeletal etiology versus gastritis versus other 5. History of smoking: 47 year history of at least 1 pack per day  Plan: 1. EGD scheduled for tomorrow with Dr. Loletha Carrow 2. Recommend patient remain nothing by mouth after midnight tonight. 3. Will replenish potassium tonight for anesthesia tomorrow 4. Will discuss above with Dr. Loletha Carrow, please await any further recommendations  Thank you for your kind consultation, we will continue to follow.  Lavone Nian Callista Hoh  04/29/2016, 4:44 PM Pager #: [phone]

## 2016-05-01 DIAGNOSIS — E43 Unspecified severe protein-calorie malnutrition: Secondary | ICD-10-CM

## 2016-05-01 LAB — BASIC METABOLIC PANEL
ANION GAP: 7 (ref 5–15)
BUN: 29 mg/dL — ABNORMAL HIGH (ref 6–20)
CALCIUM: 7.6 mg/dL — AB (ref 8.9–10.3)
CO2: 17 mmol/L — AB (ref 22–32)
CREATININE: 1.44 mg/dL — AB (ref 0.61–1.24)
Chloride: 117 mmol/L — ABNORMAL HIGH (ref 101–111)
GFR calc non Af Amer: 48 mL/min — ABNORMAL LOW (ref >60)
GFR, EST AFRICAN AMERICAN: 56 mL/min — AB (ref >60)
Glucose, Bld: 102 mg/dL — ABNORMAL HIGH (ref 65–99)
Potassium: 2.9 mmol/L — ABNORMAL LOW (ref 3.5–5.1)
SODIUM: 141 mmol/L (ref 135–145)

## 2016-05-01 LAB — MAGNESIUM
MAGNESIUM: 1.8 mg/dL (ref 1.7–2.4)
MAGNESIUM: 2.6 mg/dL — AB (ref 1.7–2.4)

## 2016-05-01 LAB — POTASSIUM: POTASSIUM: 3.4 mmol/L — AB (ref 3.5–5.1)

## 2016-05-01 LAB — TROPONIN I

## 2016-05-01 MED ORDER — POTASSIUM CHLORIDE CRYS ER 20 MEQ PO TBCR: 40.0000 meq | EXTENDED_RELEASE_TABLET | Freq: Two times a day (BID) | ORAL | Status: DC

## 2016-05-01 MED ORDER — NYSTATIN 100000 UNIT/ML MT SUSP: 5.0000 mL | Freq: Four times a day (QID) | OROMUCOSAL | Status: DC

## 2016-05-01 MED ORDER — ROSUVASTATIN CALCIUM 10 MG PO TABS
10.0000 mg | ORAL_TABLET | Freq: Every day | ORAL | Status: DC
  Administered 2016-05-01: 10 mg via ORAL
  Filled 2016-05-01: qty 1

## 2016-05-01 MED ORDER — NICOTINE 21 MG/24HR TD PT24: 21.0000 mg | MEDICATED_PATCH | Freq: Every day | TRANSDERMAL | Status: DC

## 2016-05-01 MED ORDER — PANTOPRAZOLE SODIUM 40 MG PO TBEC: 40.0000 mg | DELAYED_RELEASE_TABLET | Freq: Every day | ORAL | Status: DC

## 2016-05-01 MED ORDER — METHOTREXATE 2.5 MG PO TABS: 7.5000 mg | ORAL_TABLET | Freq: Once | ORAL | Status: DC

## 2016-05-01 MED ORDER — POTASSIUM CHLORIDE CRYS ER 20 MEQ PO TBCR
40.0000 meq | EXTENDED_RELEASE_TABLET | ORAL | Status: AC
  Administered 2016-05-01 (×2): 40 meq via ORAL
  Filled 2016-05-01 (×2): qty 2

## 2016-05-01 MED ORDER — POTASSIUM CHLORIDE CRYS ER 20 MEQ PO TBCR: 20.0000 meq | EXTENDED_RELEASE_TABLET | Freq: Every day | ORAL | Status: DC

## 2016-05-01 MED ORDER — MAGNESIUM SULFATE 2 GM/50ML IV SOLN
2.0000 g | Freq: Once | INTRAVENOUS | Status: AC
Start: 2016-05-01 — End: 2016-05-01
  Administered 2016-05-01: 2 g via INTRAVENOUS
  Filled 2016-05-01: qty 50

## 2016-05-01 MED ORDER — CLONAZEPAM 0.5 MG PO TABS
0.5000 mg | ORAL_TABLET | Freq: Every evening | ORAL | Status: DC | PRN
  Administered 2016-05-01: 0.5 mg via ORAL
  Filled 2016-05-01: qty 1

## 2016-05-01 NOTE — Progress Notes (Signed)
Went over all discharge information with patient and family.  All questions answered.  Prescriptions and discharge summary given.  VSS.  Pt refused wheelchair, requested to walk out to car.

## 2016-05-01 NOTE — Progress Notes (Signed)
Daily Rounding Note  05/01/2016, 8:10 AM  LOS: 2 days   SUBJECTIVE:   Chief complaint:  Dysphagia, odynophagia.    Swallowing improved.  No complaints  OBJECTIVE:         Vital signs in last 24 hours:    Temp:  [97.5 F (36.4 C)-98.2 F (36.8 C)] 98.2 F (36.8 C) (07/15 0651) Pulse Rate:  [62-94] 64 (07/15 0651) Resp:  [14-25] 20 (07/15 0651) BP: (108-147)/(50-84) 110/67 mmHg (07/15 0651) SpO2:  [90 %-100 %] 97 % (07/15 0651) Last BM Date: 04/30/16 Filed Weights   04/29/16 1259 04/29/16 1815  Weight: 69.854 kg (154 lb) 68.72 kg (151 lb 8 oz)   General: looks somewhat weak, ill   Heart: RRR Chest: breathing not labored Abdomen: not examined  Extremities: no CCE Neuro/Psych:  Pleasant, oriented x 3.  Fully alert  Intake/Output from previous day: 07/14 0701 - 07/15 0700 In: 2326.3 [P.O.:480; I.V.:1846.3] Out: -   Intake/Output this shift:    Lab Results:  Recent Labs  04/29/16 1238 04/30/16 0504  WBC 6.3 5.9  HGB 14.0 10.7*  HCT 42.5 33.0*  PLT 88* 66*   BMET  Recent Labs  04/29/16 1238 04/30/16 0504  NA 137 139  K 3.4* 3.7  CL 106 115*  CO2 19* 17*  GLUCOSE 115* 96  BUN 34* 28*  CREATININE 2.09* 1.73*  CALCIUM 8.2* 7.0*   LFT  Recent Labs  04/30/16 0504  PROT 5.8*  ALBUMIN 2.4*  AST 16  ALT 15*  ALKPHOS 53  BILITOT 0.5   PT/INR No results for input(s): LABPROT, INR in the last 72 hours. Hepatitis Panel No results for input(s): HEPBSAG, HCVAB, HEPAIGM, HEPBIGM in the last 72 hours.  Studies/Results: Dg Chest 2 View  04/29/2016  CLINICAL DATA:  Generalized weakness, fever, upper chest pain, dysphagia EXAM: CHEST  2 VIEW COMPARISON:  10/07/2015 FINDINGS: Cardiomediastinal silhouette is stable. Hyperinflation again noted. Stable emphysematous changes and fibrotic changes especially in lower lobes. No definite superimposed infiltrate or pulmonary edema. Bony thorax is  unremarkable. IMPRESSION: No active disease. Stable hyperinflation, chronic interstitial prominence and fibrotic changes. Electronically Signed   By: Lahoma Crocker M.D.   On: 04/29/2016 13:31   Ct Soft Tissue Neck Wo Contrast  04/29/2016  CLINICAL DATA:  70 y/o M; generalized weakness with onset 11 days ago. He woke up on July 2nd began experiencing diffuse neck pain, upper chest pain, and dysphagia. EXAM: CT NECK WITHOUT CONTRAST TECHNIQUE: Multidetector CT imaging of the neck was performed following the standard protocol without intravenous contrast. COMPARISON:  MRI of the cervical spine dated 04/23/2016. Chest CT dated 10/07/2015. FINDINGS: Pharynx and larynx: No exophytic mucosal mass is identified. No inflammatory changes. 3 mm left-sided internal laryngocele. Salivary glands: Punctate calcifications within the left parotid gland probably represents sequelae prior parotiditis. L1 salivary glands are unremarkable. Thyroid: Unremarkable Lymph nodes: No lymphadenopathy. Vascular: Calcified plaque of the brachiocephalic and bilateral proximal subclavian arteries. Calcified plaque common carotid arteries bilaterally and of the carotid bifurcations moderate calcified plaque of the cavernous internal carotid arteries. Limited intracranial: Negative. Visualized orbits: Negative. Mastoids and visualized paranasal sinuses: Normally aerated. Leftward buckling of the anterior nasal septum may represent sequela of old trauma. Right concha bullosa. Hypoplastic right maxillary sinus. Skeleton: Cervical spondylosis greatest at the C5 and C7 levels is better characterized on the prior MRI of the cervical spine. No acute bony or articular abnormality is identified Upper chest: Severe emphysema  of the lung apices and biapical calcified scarring. Stable 6 mm nodule in the left upper lobe series 7, image 5. IMPRESSION: 1. No findings explanation for diffuse neck pain is identified. 2. Severe emphysema of lung apices and left upper  lobe nodule stable from prior chest CT. 3. Cervical spine degenerative changes as seen on prior cervical MRI. No acute bony or articular abnormality is identified. 4. Calcified plaque of great vessels and carotid bifurcations. 5. No lymphadenopathy or discrete cervical mass is identified on this noncontrast examination. Electronically Signed   By: Kristine Garbe M.D.   On: 04/29/2016 15:15   Scheduled Meds: . clonazePAM  0.5 mg Oral Daily  . enoxaparin (LOVENOX) injection  40 mg Subcutaneous Q24H  . feeding supplement  1 Container Oral BID BM  . fluticasone  2 spray Each Nare Daily  . folic acid  1 mg Oral Daily  . mometasone-formoterol  2 puff Inhalation BID  . nicotine  21 mg Transdermal Daily  . nystatin  5 mL Oral QID  . pantoprazole  40 mg Oral Daily  . predniSONE  10 mg Oral Daily  . sodium chloride flush  3 mL Intravenous Q12H  . tiotropium  1 capsule Inhalation Daily   Continuous Infusions: . sodium chloride 75 mL/hr at 04/30/16 2106   PRN Meds:.acetaminophen, clonazePAM, HYDROcodone-acetaminophen   ASSESMENT:   *  Dys/odynophagia.  Swallowing improved with treatment of oral thrush.   EGD 7/14: Non-obstructing Schatski ring, hemorrhagic gastritis. No candidiasis.   ? If swallowing issues secondary to degenerative c-spine disease.  No PPI PTA.     PLAN   *  Once daily PPI, protonix currently.  *  Pt planning follow up of c spine with laser treatments per chiropracter and may see neurosurgeon if necessary but fearful of spinal injections.     *  GI will sign off, available PRN.  Daily PPI.      Azucena Freed  05/01/2016, 8:10 AM Pager: [phone]   I have discussed the case with the PA, and that is the plan I formulated. I personally interviewed and examined the patient.  CC: odynophagia Dysphagia  Improved.  No findings on EGD other than UES tightness without stricture - suspected contribution of cervical disc disease.  Signing off - call as need  arises.    Nelida Meuse III Pager [phone]  Mon-Fri 8a-5p [phone] after 5p, weekends, holidays

## 2016-05-01 NOTE — Progress Notes (Signed)
CCMD called to make me aware that patient is  having trigeminal and quadrigeminal pvc's this am.  Strip is saved in the chart. MD made aware.  Will continue to monitor closely.

## 2016-05-02 LAB — CULTURE, GROUP A STREP (THRC)

## 2016-05-02 NOTE — Discharge Summary (Signed)
Discharge Summary  Kenneth [name] XBD:[id number] DOB: [birth year]  PCP: Jerlyn Ly, MD  Admit date: 04/29/2016 Discharge date: 05/01/2016   Recommendations for Outpatient Follow-up:  1. PCP 1-2 weeks.   Discharge Diagnoses:  Active Hospital Problems   Diagnosis Date Noted  . Protein-calorie malnutrition, severe 04/30/2016  . Esophagitis 04/29/2016  . Dysphagia 04/29/2016  . Odynophagia   . Abnormal loss of weight     Resolved Hospital Problems   Diagnosis Date Noted Date Resolved  No resolved problems to display.    Discharge Condition: Stable   Diet recommendation: Soft diet   Filed Vitals:   05/01/16 0651 05/01/16 1417  BP: 110/67 117/65  Pulse: 64 82  Temp: 98.2 F (36.8 C) 98.2 F (36.8 C)  Resp: 20 18    History of present illness:  70 year odl life long smoker , with h/o copd (not on home 02, not steroids dependent), h/o advanced rheumatoid arthritis on chronic immunosuppression currently on methotrexate and daily prednisone, h/o pulmonary fibrosis who recently developed jaw/neck/anterior chest pain.   Hospital Course:  Active Problems:   Esophagitis   Dysphagia   Odynophagia   Abnormal loss of weight   Protein-calorie malnutrition, severe  1. Cardiovascular. Dehydration. He was given hydration with 1/2 normal saline at 75 cc/hr, his renal function continued to improve.  2. Pulmonary. No signs of aspiration, will continue to monitor oxymetry and supplemental 02 per [place].  3. Nephrology. AKI. Due to dehydration, was treated with 1/2 NS, responded well to this. Hyperchloremic non gap acidosis.  hydration with hypotonic saline. Hypokalemia this was repleted several times in house, and patient was d/c home on KCl supplementation. Was advised to obtain labs at PCP office this coming week.  4. Gastroenterology. Dysphagia Endoscopy done 7/14 with no intraluminal esophageal lesions, nondilated stricture, also had speech and swallow evaluation without evidence of  any aspiration. Gastritis. He was started on antiacid therapy with pantoprazole per GI, and will cont this at home. Oral thrush Will continue nystatin oral suspension, his pain resolved after starting this treatment and he will discharge home today to complete a 10 day course.    5. Musculoskeletal. RA Will continue pain control with hydrocodone, steroids with prednisone.   6. Heme thrombocytopenia patient with low plts in the hospital, unclear of etiology and chronicity. Could be due to chronic illness or MTX use. MTX was heldl in house and pt was advised to have repeat CBC this coming week and discuss MTX use with his rheumatologist.  Patient continue at moderate risk for worsening dehydration and renal failure.   Procedures:  EGD 7/14   Consultations:  GI   Discharge Exam: BP 117/65 mmHg  Pulse 82  Temp(Src) 98.2 F (36.8 C) (Oral)  Resp 18  Ht '5\' 10"'$  (1.778 m)  Wt 68.72 kg (151 lb 8 oz)  BMI 21.74 kg/m2  SpO2 98% General:  Alert, oriented, calm, in no acute distress  Eyes: pupils round and reactive to light and accomodation, clear sclerea Neck: supple, no masses, trachea mildline  Cardiovascular: RRR, no murmurs or rubs, no peripheral edema  Respiratory: clear to auscultation bilaterally, no wheezes, no crackles  Abdomen: soft, nontender, nondistended, normal bowel tones heard  Skin: dry, no rashes  Musculoskeletal: no joint effusions, normal range of motion  Psychiatric: appropriate affect, normal speech  Neurologic: extraocular muscles intact, clear speech, moving all extremities with intact sensorium    Discharge Instructions You were cared for by a hospitalist during your hospital stay. If  you have any questions about your discharge medications or the care you received while you were in the hospital after you are discharged, you can call the unit and asked to speak with the hospitalist on call if the hospitalist that took care of you is not available. Once you are  discharged, your primary care physician will handle any further medical issues. Please note that NO REFILLS for any discharge medications will be authorized once you are discharged, as it is imperative that you return to your primary care physician (or establish a relationship with a primary care physician if you do not have one) for your aftercare needs so that they can reassess your need for medications and monitor your lab values.  Discharge Instructions    Diet - low sodium heart healthy    Complete by:  As directed      Increase activity slowly    Complete by:  As directed             Medication List    STOP taking these medications        amLODipine 5 MG tablet  Commonly known as:  NORVASC     sodium chloride 0.9 % SOLN 92 mL with golimumab 50 MG/4ML SOLN 100 mg      TAKE these medications        acetaminophen 325 MG tablet  Commonly known as:  TYLENOL  Take 325 mg by mouth every 4 (four) hours as needed for mild pain.     budesonide-formoterol 80-4.5 MCG/ACT inhaler  Commonly known as:  SYMBICORT  Inhale 2 puffs into the lungs 2 (two) times daily.     cholecalciferol 1000 units tablet  Commonly known as:  VITAMIN D  Take 1,000 Units by mouth daily.     clonazePAM 0.5 MG tablet  Commonly known as:  KLONOPIN  Take 0.5 mg by mouth daily.     fluticasone 50 MCG/ACT nasal spray  Commonly known as:  FLONASE  Place 2 sprays into the nose daily.     folic acid 1 MG tablet  Commonly known as:  FOLVITE  Take 1 mg by mouth daily.     HYDROcodone-acetaminophen 5-500 MG tablet  Commonly known as:  VICODIN  Take 1 tablet by mouth 2 (two) times daily as needed for pain.     K-PHOS 500 MG tablet  Generic drug:  potassium phosphate (monobasic)  Take 500 mg by mouth daily.     methotrexate 2.5 MG tablet  Commonly known as:  RHEUMATREX  Take 7.5 mg by mouth 2 (two) times a week. Take 3 tabs every Friday 3 tabs on Sunday     nicotine 21 mg/24hr patch  Commonly known as:   NICODERM CQ - dosed in mg/24 hours  Place 1 patch (21 mg total) onto the skin daily.     nystatin 100000 UNIT/ML suspension  Commonly known as:  MYCOSTATIN  Take 5 mLs (500,000 Units total) by mouth 4 (four) times daily.     pantoprazole 40 MG tablet  Commonly known as:  PROTONIX  Take 1 tablet (40 mg total) by mouth daily.     phosphorus 155-852-130 MG tablet  Commonly known as:  K PHOS NEUTRAL  Take by mouth 2 (two) times daily.     potassium chloride SA 20 MEQ tablet  Commonly known as:  K-DUR,KLOR-CON  Take 1 tablet (20 mEq total) by mouth daily.     predniSONE 10 MG tablet  Commonly known as:  DELTASONE  Take 10 mg by mouth daily.     promethazine 25 MG tablet  Commonly known as:  PHENERGAN  Take 25 mg by mouth 2 (two) times daily as needed for nausea or vomiting.     RECLAST 5 MG/100ML Soln injection  Generic drug:  zoledronic acid  Inject 5 mg into the vein once. Every 2 years     rosuvastatin 20 MG tablet  Commonly known as:  CRESTOR  Take 10 mg by mouth daily.     SPIRIVA RESPIMAT 1.25 MCG/ACT Aers  Generic drug:  Tiotropium Bromide Monohydrate  Inhale 2 puffs into the lungs daily.     TUSSIN DM PO  Take 1 tablet by mouth every 6 (six) hours as needed (cough and cold).       Allergies  Allergen Reactions  . Aspirin Other (See Comments)    Gi upset  . Naproxen Sodium Other (See Comments)    Whelps in large doses      The results of significant diagnostics from this hospitalization (including imaging, microbiology, ancillary and laboratory) are listed below for reference.    Significant Diagnostic Studies: Dg Chest 2 View  04/29/2016  CLINICAL DATA:  Generalized weakness, fever, upper chest pain, dysphagia EXAM: CHEST  2 VIEW COMPARISON:  10/07/2015 FINDINGS: Cardiomediastinal silhouette is stable. Hyperinflation again noted. Stable emphysematous changes and fibrotic changes especially in lower lobes. No definite superimposed infiltrate or pulmonary  edema. Bony thorax is unremarkable. IMPRESSION: No active disease. Stable hyperinflation, chronic interstitial prominence and fibrotic changes. Electronically Signed   By: Lahoma Crocker M.D.   On: 04/29/2016 13:31   Ct Soft Tissue Neck Wo Contrast  04/29/2016  CLINICAL DATA:  70 y/o M; generalized weakness with onset 11 days ago. He woke up on July 2nd began experiencing diffuse neck pain, upper chest pain, and dysphagia. EXAM: CT NECK WITHOUT CONTRAST TECHNIQUE: Multidetector CT imaging of the neck was performed following the standard protocol without intravenous contrast. COMPARISON:  MRI of the cervical spine dated 04/23/2016. Chest CT dated 10/07/2015. FINDINGS: Pharynx and larynx: No exophytic mucosal mass is identified. No inflammatory changes. 3 mm left-sided internal laryngocele. Salivary glands: Punctate calcifications within the left parotid gland probably represents sequelae prior parotiditis. L1 salivary glands are unremarkable. Thyroid: Unremarkable Lymph nodes: No lymphadenopathy. Vascular: Calcified plaque of the brachiocephalic and bilateral proximal subclavian arteries. Calcified plaque common carotid arteries bilaterally and of the carotid bifurcations moderate calcified plaque of the cavernous internal carotid arteries. Limited intracranial: Negative. Visualized orbits: Negative. Mastoids and visualized paranasal sinuses: Normally aerated. Leftward buckling of the anterior nasal septum may represent sequela of old trauma. Right concha bullosa. Hypoplastic right maxillary sinus. Skeleton: Cervical spondylosis greatest at the C5 and C7 levels is better characterized on the prior MRI of the cervical spine. No acute bony or articular abnormality is identified Upper chest: Severe emphysema of the lung apices and biapical calcified scarring. Stable 6 mm nodule in the left upper lobe series 7, image 5. IMPRESSION: 1. No findings explanation for diffuse neck pain is identified. 2. Severe emphysema of lung  apices and left upper lobe nodule stable from prior chest CT. 3. Cervical spine degenerative changes as seen on prior cervical MRI. No acute bony or articular abnormality is identified. 4. Calcified plaque of great vessels and carotid bifurcations. 5. No lymphadenopathy or discrete cervical mass is identified on this noncontrast examination. Electronically Signed   By: Kristine Garbe M.D.   On: 04/29/2016 15:15   Mr Cervical Spine Wo Contrast  04/23/2016  CLINICAL DATA:  Neck pain radiating into left shoulder, left hand and left finger tips. Numbness and tingling. No history of injury. EXAM: MRI CERVICAL SPINE WITHOUT CONTRAST TECHNIQUE: Multiplanar, multisequence MR imaging of the cervical spine was performed. No intravenous contrast was administered. COMPARISON:  None. FINDINGS: Alignment: Straightening of the normal cervical lordosis. No subluxation. Vertebrae: No focal marrow signal abnormality. No compression fracture. Cord: Course and caliber of the spinal cord are normal. Posterior Fossa, vertebral arteries, paraspinal tissues: The visualized posterior fossa and craniocervical junction are normal. Vertebral artery flow voids are preserved. The visualized paraspinal soft tissues are unremarkable. Disc levels: C1-C2: Mild degenerative change. C2-C3: Normal disc space and facet joints. No spinal canal stenosis. No neuroforaminal stenosis. C3-C4: Small central disc protrusion and bilateral mild uncovertebral spurring. Right greater than left mild facet hypertrophy. No spinal canal stenosis. Mild right neuroforaminal stenosis. C4-C5: Moderate central disc protrusion narrowing the ventral thecal sac and mildly indenting the ventral aspect of the spinal cord. No cord signal change. There is a small low signal focus projecting medially from the left facet joint seen on the gradient sequence only, not confirmed on the spin echo, favored to be artifactual. No spinal canal stenosis. No neuroforaminal  stenosis. C5-C6: Narrowing of the intervertebral disc space with a small disc osteophyte complex. Mild spinal canal stenosis. Mild right and severe left neuroforaminal stenosis. C6-C7: Severe narrowing of the intervertebral disc space with moderate central extrusion. Bilateral mild uncovertebral spurring. Normal facets. Mild spinal canal stenosis. Mild right and mild-moderate left neuroforaminal stenosis. C7-T1: Narrowing of the intervertebral disc space with small disc osteophyte complex. No spinal canal stenosis. Mild left neuroforaminal stenosis. IMPRESSION: 1. Lower level predominant cervical degenerative disc disease with multilevel, left worst than right, neural foraminal stenosis, most severe at left C5-6. 2. Mild spinal canal stenosis at C5-6 and C6-7. Electronically Signed   By: Ulyses Jarred M.D.   On: 04/23/2016 11:01    Microbiology: Recent Results (from the past 240 hour(s))  Blood culture (routine x 2)     Status: None (Preliminary result)   Collection Time: 04/29/16 12:48 PM  Result Value Ref Range Status   Specimen Description BLOOD RIGHT ANTECUBITAL  Final   Special Requests BOTTLES DRAWN AEROBIC AND ANAEROBIC 5 CC EA  Final   Culture   Final    NO GROWTH 2 DAYS Performed at [facility]    Report Status PENDING  Incomplete  Blood culture (routine x 2)     Status: None (Preliminary result)   Collection Time: 04/29/16 12:55 PM  Result Value Ref Range Status   Specimen Description BLOOD BLOOD LEFT FOREARM  Final   Special Requests BOTTLES DRAWN AEROBIC AND ANAEROBIC 5 CC EA  Final   Culture   Final    NO GROWTH 2 DAYS Performed at [facility]    Report Status PENDING  Incomplete  Urine culture     Status: Abnormal   Collection Time: 04/29/16  1:08 PM  Result Value Ref Range Status   Specimen Description URINE, RANDOM  Final   Special Requests NONE  Final   Culture (A)  Final    <10,000 COLONIES/mL INSIGNIFICANT GROWTH Performed at [facility]     Report Status 04/30/2016 FINAL  Final  Rapid strep screen     Status: None   Collection Time: 04/29/16  1:11 PM  Result Value Ref Range Status   Streptococcus, Group A Screen (Direct) NEGATIVE NEGATIVE Final    Comment: (  NOTE) A Rapid Antigen test may result negative if the antigen level in the sample is below the detection level of this test. The FDA has not cleared this test as a stand-alone test therefore the rapid antigen negative result has reflexed to a Group A Strep culture.   Culture, group A strep     Status: None (Preliminary result)   Collection Time: 04/29/16  1:11 PM  Result Value Ref Range Status   Specimen Description THROAT  Final   Special Requests NONE Reflexed from [id number]  Final   Culture   Final    CULTURE REINCUBATED FOR BETTER GROWTH Performed at [facility]    Report Status PENDING  Incomplete     Labs: Basic Metabolic Panel:  Recent Labs Lab 04/29/16 1238 04/30/16 0504 05/01/16 1240 05/01/16 1800  NA 137 139 141  --   K 3.4* 3.7 2.9* 3.4*  CL 106 115* 117*  --   CO2 19* 17* 17*  --   GLUCOSE 115* 96 102*  --   BUN 34* 28* 29*  --   CREATININE 2.09* 1.73* 1.44*  --   CALCIUM 8.2* 7.0* 7.6*  --   MG  --  1.9 1.8 2.6*   Liver Function Tests:  Recent Labs Lab 04/30/16 0504  AST 16  ALT 15*  ALKPHOS 53  BILITOT 0.5  PROT 5.8*  ALBUMIN 2.4*   No results for input(s): LIPASE, AMYLASE in the last 168 hours. No results for input(s): AMMONIA in the last 168 hours. CBC:  Recent Labs Lab 04/29/16 1238 04/30/16 0504  WBC 6.3 5.9  NEUTROABS  --  5.3  HGB 14.0 10.7*  HCT 42.5 33.0*  MCV 103.7* 104.1*  PLT 88* 66*   Cardiac Enzymes:  Recent Labs Lab 05/01/16 1240  TROPONINI <0.03   BNP: BNP (last 3 results) No results for input(s): BNP in the last 8760 hours.  ProBNP (last 3 results) No results for input(s): PROBNP in the last 8760 hours.  CBG:  Recent Labs Lab 04/29/16 1247  GLUCAP 114*    Time spent: 41  minutes were spent in preparing this discharge including medication reconciliation, counseling, and coordination of care.  Signed:  Holle Sprick [employer]  [facility] 05/02/2016, 7:44 AM

## 2016-05-03 ENCOUNTER — Encounter (HOSPITAL_COMMUNITY): Payer: Self-pay | Admitting: Gastroenterology

## 2016-05-04 ENCOUNTER — Encounter: Payer: Self-pay | Admitting: Gastroenterology

## 2016-05-04 DIAGNOSIS — F322 Major depressive disorder, single episode, severe without psychotic features: Secondary | ICD-10-CM | POA: Diagnosis not present

## 2016-05-04 LAB — CULTURE, BLOOD (ROUTINE X 2)
Culture: NO GROWTH
Culture: NO GROWTH

## 2016-05-04 LAB — CMV DNA BY PCR, QUALITATIVE: CMV DNA, Qual PCR: NEGATIVE

## 2016-05-05 DIAGNOSIS — M50322 Other cervical disc degeneration at C5-C6 level: Secondary | ICD-10-CM | POA: Diagnosis not present

## 2016-05-05 DIAGNOSIS — M9901 Segmental and somatic dysfunction of cervical region: Secondary | ICD-10-CM | POA: Diagnosis not present

## 2016-05-05 DIAGNOSIS — Q72811 Congenital shortening of right lower limb: Secondary | ICD-10-CM | POA: Diagnosis not present

## 2016-05-05 DIAGNOSIS — M9902 Segmental and somatic dysfunction of thoracic region: Secondary | ICD-10-CM | POA: Diagnosis not present

## 2016-05-05 DIAGNOSIS — M9903 Segmental and somatic dysfunction of lumbar region: Secondary | ICD-10-CM | POA: Diagnosis not present

## 2016-05-05 DIAGNOSIS — M9905 Segmental and somatic dysfunction of pelvic region: Secondary | ICD-10-CM | POA: Diagnosis not present

## 2016-05-05 DIAGNOSIS — M5137 Other intervertebral disc degeneration, lumbosacral region: Secondary | ICD-10-CM | POA: Diagnosis not present

## 2016-05-05 DIAGNOSIS — M9904 Segmental and somatic dysfunction of sacral region: Secondary | ICD-10-CM | POA: Diagnosis not present

## 2016-05-05 DIAGNOSIS — M791 Myalgia: Secondary | ICD-10-CM | POA: Diagnosis not present

## 2016-05-06 DIAGNOSIS — I10 Essential (primary) hypertension: Secondary | ICD-10-CM | POA: Diagnosis not present

## 2016-05-07 DIAGNOSIS — M9902 Segmental and somatic dysfunction of thoracic region: Secondary | ICD-10-CM | POA: Diagnosis not present

## 2016-05-07 DIAGNOSIS — M791 Myalgia: Secondary | ICD-10-CM | POA: Diagnosis not present

## 2016-05-07 DIAGNOSIS — M9904 Segmental and somatic dysfunction of sacral region: Secondary | ICD-10-CM | POA: Diagnosis not present

## 2016-05-07 DIAGNOSIS — M50322 Other cervical disc degeneration at C5-C6 level: Secondary | ICD-10-CM | POA: Diagnosis not present

## 2016-05-07 DIAGNOSIS — M5137 Other intervertebral disc degeneration, lumbosacral region: Secondary | ICD-10-CM | POA: Diagnosis not present

## 2016-05-07 DIAGNOSIS — M9905 Segmental and somatic dysfunction of pelvic region: Secondary | ICD-10-CM | POA: Diagnosis not present

## 2016-05-07 DIAGNOSIS — Q72811 Congenital shortening of right lower limb: Secondary | ICD-10-CM | POA: Diagnosis not present

## 2016-05-07 DIAGNOSIS — M9903 Segmental and somatic dysfunction of lumbar region: Secondary | ICD-10-CM | POA: Diagnosis not present

## 2016-05-07 DIAGNOSIS — M9901 Segmental and somatic dysfunction of cervical region: Secondary | ICD-10-CM | POA: Diagnosis not present

## 2016-05-10 DIAGNOSIS — M791 Myalgia: Secondary | ICD-10-CM | POA: Diagnosis not present

## 2016-05-10 DIAGNOSIS — M9905 Segmental and somatic dysfunction of pelvic region: Secondary | ICD-10-CM | POA: Diagnosis not present

## 2016-05-10 DIAGNOSIS — M9903 Segmental and somatic dysfunction of lumbar region: Secondary | ICD-10-CM | POA: Diagnosis not present

## 2016-05-10 DIAGNOSIS — M9904 Segmental and somatic dysfunction of sacral region: Secondary | ICD-10-CM | POA: Diagnosis not present

## 2016-05-10 DIAGNOSIS — M50322 Other cervical disc degeneration at C5-C6 level: Secondary | ICD-10-CM | POA: Diagnosis not present

## 2016-05-10 DIAGNOSIS — M5137 Other intervertebral disc degeneration, lumbosacral region: Secondary | ICD-10-CM | POA: Diagnosis not present

## 2016-05-10 DIAGNOSIS — M9902 Segmental and somatic dysfunction of thoracic region: Secondary | ICD-10-CM | POA: Diagnosis not present

## 2016-05-10 DIAGNOSIS — Q72811 Congenital shortening of right lower limb: Secondary | ICD-10-CM | POA: Diagnosis not present

## 2016-05-10 DIAGNOSIS — M9901 Segmental and somatic dysfunction of cervical region: Secondary | ICD-10-CM | POA: Diagnosis not present

## 2016-05-10 DIAGNOSIS — F322 Major depressive disorder, single episode, severe without psychotic features: Secondary | ICD-10-CM | POA: Diagnosis not present

## 2016-05-12 DIAGNOSIS — I82442 Acute embolism and thrombosis of left tibial vein: Secondary | ICD-10-CM | POA: Diagnosis not present

## 2016-05-12 DIAGNOSIS — M791 Myalgia: Secondary | ICD-10-CM | POA: Diagnosis not present

## 2016-05-12 DIAGNOSIS — I82412 Acute embolism and thrombosis of left femoral vein: Secondary | ICD-10-CM | POA: Diagnosis not present

## 2016-05-12 DIAGNOSIS — M5137 Other intervertebral disc degeneration, lumbosacral region: Secondary | ICD-10-CM | POA: Diagnosis not present

## 2016-05-12 DIAGNOSIS — M9903 Segmental and somatic dysfunction of lumbar region: Secondary | ICD-10-CM | POA: Diagnosis not present

## 2016-05-12 DIAGNOSIS — Q72811 Congenital shortening of right lower limb: Secondary | ICD-10-CM | POA: Diagnosis not present

## 2016-05-12 DIAGNOSIS — M9905 Segmental and somatic dysfunction of pelvic region: Secondary | ICD-10-CM | POA: Diagnosis not present

## 2016-05-12 DIAGNOSIS — M9902 Segmental and somatic dysfunction of thoracic region: Secondary | ICD-10-CM | POA: Diagnosis not present

## 2016-05-12 DIAGNOSIS — I82432 Acute embolism and thrombosis of left popliteal vein: Secondary | ICD-10-CM | POA: Diagnosis not present

## 2016-05-12 DIAGNOSIS — M9901 Segmental and somatic dysfunction of cervical region: Secondary | ICD-10-CM | POA: Diagnosis not present

## 2016-05-12 DIAGNOSIS — M50322 Other cervical disc degeneration at C5-C6 level: Secondary | ICD-10-CM | POA: Diagnosis not present

## 2016-05-12 DIAGNOSIS — M9904 Segmental and somatic dysfunction of sacral region: Secondary | ICD-10-CM | POA: Diagnosis not present

## 2016-05-13 DIAGNOSIS — M9901 Segmental and somatic dysfunction of cervical region: Secondary | ICD-10-CM | POA: Diagnosis not present

## 2016-05-13 DIAGNOSIS — M9902 Segmental and somatic dysfunction of thoracic region: Secondary | ICD-10-CM | POA: Diagnosis not present

## 2016-05-13 DIAGNOSIS — M9904 Segmental and somatic dysfunction of sacral region: Secondary | ICD-10-CM | POA: Diagnosis not present

## 2016-05-13 DIAGNOSIS — M791 Myalgia: Secondary | ICD-10-CM | POA: Diagnosis not present

## 2016-05-13 DIAGNOSIS — N183 Chronic kidney disease, stage 3 (moderate): Secondary | ICD-10-CM | POA: Diagnosis not present

## 2016-05-13 DIAGNOSIS — M9905 Segmental and somatic dysfunction of pelvic region: Secondary | ICD-10-CM | POA: Diagnosis not present

## 2016-05-13 DIAGNOSIS — M5137 Other intervertebral disc degeneration, lumbosacral region: Secondary | ICD-10-CM | POA: Diagnosis not present

## 2016-05-13 DIAGNOSIS — Z79899 Other long term (current) drug therapy: Secondary | ICD-10-CM | POA: Diagnosis not present

## 2016-05-13 DIAGNOSIS — Q72811 Congenital shortening of right lower limb: Secondary | ICD-10-CM | POA: Diagnosis not present

## 2016-05-13 DIAGNOSIS — M50322 Other cervical disc degeneration at C5-C6 level: Secondary | ICD-10-CM | POA: Diagnosis not present

## 2016-05-13 DIAGNOSIS — M9903 Segmental and somatic dysfunction of lumbar region: Secondary | ICD-10-CM | POA: Diagnosis not present

## 2016-05-17 DIAGNOSIS — M9904 Segmental and somatic dysfunction of sacral region: Secondary | ICD-10-CM | POA: Diagnosis not present

## 2016-05-17 DIAGNOSIS — M0570 Rheumatoid arthritis with rheumatoid factor of unspecified site without organ or systems involvement: Secondary | ICD-10-CM | POA: Diagnosis not present

## 2016-05-17 DIAGNOSIS — D649 Anemia, unspecified: Secondary | ICD-10-CM | POA: Diagnosis not present

## 2016-05-17 DIAGNOSIS — B370 Candidal stomatitis: Secondary | ICD-10-CM | POA: Diagnosis not present

## 2016-05-17 DIAGNOSIS — M5137 Other intervertebral disc degeneration, lumbosacral region: Secondary | ICD-10-CM | POA: Diagnosis not present

## 2016-05-17 DIAGNOSIS — M9901 Segmental and somatic dysfunction of cervical region: Secondary | ICD-10-CM | POA: Diagnosis not present

## 2016-05-17 DIAGNOSIS — M9903 Segmental and somatic dysfunction of lumbar region: Secondary | ICD-10-CM | POA: Diagnosis not present

## 2016-05-17 DIAGNOSIS — Q72811 Congenital shortening of right lower limb: Secondary | ICD-10-CM | POA: Diagnosis not present

## 2016-05-17 DIAGNOSIS — D696 Thrombocytopenia, unspecified: Secondary | ICD-10-CM | POA: Diagnosis not present

## 2016-05-17 DIAGNOSIS — M50322 Other cervical disc degeneration at C5-C6 level: Secondary | ICD-10-CM | POA: Diagnosis not present

## 2016-05-17 DIAGNOSIS — M791 Myalgia: Secondary | ICD-10-CM | POA: Diagnosis not present

## 2016-05-17 DIAGNOSIS — M7989 Other specified soft tissue disorders: Secondary | ICD-10-CM | POA: Diagnosis not present

## 2016-05-17 DIAGNOSIS — M9902 Segmental and somatic dysfunction of thoracic region: Secondary | ICD-10-CM | POA: Diagnosis not present

## 2016-05-17 DIAGNOSIS — R4702 Dysphasia: Secondary | ICD-10-CM | POA: Diagnosis not present

## 2016-05-17 DIAGNOSIS — M545 Low back pain: Secondary | ICD-10-CM | POA: Diagnosis not present

## 2016-05-17 DIAGNOSIS — M9905 Segmental and somatic dysfunction of pelvic region: Secondary | ICD-10-CM | POA: Diagnosis not present

## 2016-05-17 DIAGNOSIS — M542 Cervicalgia: Secondary | ICD-10-CM | POA: Diagnosis not present

## 2016-05-18 DIAGNOSIS — M50322 Other cervical disc degeneration at C5-C6 level: Secondary | ICD-10-CM | POA: Diagnosis not present

## 2016-05-18 DIAGNOSIS — Q72811 Congenital shortening of right lower limb: Secondary | ICD-10-CM | POA: Diagnosis not present

## 2016-05-18 DIAGNOSIS — M9904 Segmental and somatic dysfunction of sacral region: Secondary | ICD-10-CM | POA: Diagnosis not present

## 2016-05-18 DIAGNOSIS — M9901 Segmental and somatic dysfunction of cervical region: Secondary | ICD-10-CM | POA: Diagnosis not present

## 2016-05-18 DIAGNOSIS — M5137 Other intervertebral disc degeneration, lumbosacral region: Secondary | ICD-10-CM | POA: Diagnosis not present

## 2016-05-18 DIAGNOSIS — M9902 Segmental and somatic dysfunction of thoracic region: Secondary | ICD-10-CM | POA: Diagnosis not present

## 2016-05-18 DIAGNOSIS — M9905 Segmental and somatic dysfunction of pelvic region: Secondary | ICD-10-CM | POA: Diagnosis not present

## 2016-05-18 DIAGNOSIS — M9903 Segmental and somatic dysfunction of lumbar region: Secondary | ICD-10-CM | POA: Diagnosis not present

## 2016-05-18 DIAGNOSIS — M791 Myalgia: Secondary | ICD-10-CM | POA: Diagnosis not present

## 2016-05-20 DIAGNOSIS — M9903 Segmental and somatic dysfunction of lumbar region: Secondary | ICD-10-CM | POA: Diagnosis not present

## 2016-05-20 DIAGNOSIS — M9905 Segmental and somatic dysfunction of pelvic region: Secondary | ICD-10-CM | POA: Diagnosis not present

## 2016-05-20 DIAGNOSIS — M9901 Segmental and somatic dysfunction of cervical region: Secondary | ICD-10-CM | POA: Diagnosis not present

## 2016-05-20 DIAGNOSIS — M5137 Other intervertebral disc degeneration, lumbosacral region: Secondary | ICD-10-CM | POA: Diagnosis not present

## 2016-05-20 DIAGNOSIS — M9902 Segmental and somatic dysfunction of thoracic region: Secondary | ICD-10-CM | POA: Diagnosis not present

## 2016-05-20 DIAGNOSIS — M791 Myalgia: Secondary | ICD-10-CM | POA: Diagnosis not present

## 2016-05-20 DIAGNOSIS — Q72811 Congenital shortening of right lower limb: Secondary | ICD-10-CM | POA: Diagnosis not present

## 2016-05-20 DIAGNOSIS — M50322 Other cervical disc degeneration at C5-C6 level: Secondary | ICD-10-CM | POA: Diagnosis not present

## 2016-05-20 DIAGNOSIS — F322 Major depressive disorder, single episode, severe without psychotic features: Secondary | ICD-10-CM | POA: Diagnosis not present

## 2016-05-20 DIAGNOSIS — M9904 Segmental and somatic dysfunction of sacral region: Secondary | ICD-10-CM | POA: Diagnosis not present

## 2016-05-27 DIAGNOSIS — I10 Essential (primary) hypertension: Secondary | ICD-10-CM | POA: Diagnosis not present

## 2016-05-27 DIAGNOSIS — M069 Rheumatoid arthritis, unspecified: Secondary | ICD-10-CM | POA: Diagnosis not present

## 2016-05-27 DIAGNOSIS — Z6822 Body mass index (BMI) 22.0-22.9, adult: Secondary | ICD-10-CM | POA: Diagnosis not present

## 2016-05-27 DIAGNOSIS — I8290 Acute embolism and thrombosis of unspecified vein: Secondary | ICD-10-CM | POA: Diagnosis not present

## 2016-05-27 DIAGNOSIS — B0229 Other postherpetic nervous system involvement: Secondary | ICD-10-CM | POA: Diagnosis not present

## 2016-05-30 DIAGNOSIS — Z6822 Body mass index (BMI) 22.0-22.9, adult: Secondary | ICD-10-CM | POA: Diagnosis not present

## 2016-05-30 DIAGNOSIS — R600 Localized edema: Secondary | ICD-10-CM | POA: Diagnosis not present

## 2016-05-30 DIAGNOSIS — M542 Cervicalgia: Secondary | ICD-10-CM | POA: Diagnosis not present

## 2016-05-30 DIAGNOSIS — F17200 Nicotine dependence, unspecified, uncomplicated: Secondary | ICD-10-CM | POA: Diagnosis not present

## 2016-05-30 DIAGNOSIS — K2970 Gastritis, unspecified, without bleeding: Secondary | ICD-10-CM | POA: Diagnosis not present

## 2016-06-14 DIAGNOSIS — D696 Thrombocytopenia, unspecified: Secondary | ICD-10-CM | POA: Diagnosis not present

## 2016-06-14 DIAGNOSIS — M545 Low back pain: Secondary | ICD-10-CM | POA: Diagnosis not present

## 2016-06-14 DIAGNOSIS — M0570 Rheumatoid arthritis with rheumatoid factor of unspecified site without organ or systems involvement: Secondary | ICD-10-CM | POA: Diagnosis not present

## 2016-06-14 DIAGNOSIS — B029 Zoster without complications: Secondary | ICD-10-CM | POA: Diagnosis not present

## 2016-06-14 DIAGNOSIS — Z79899 Other long term (current) drug therapy: Secondary | ICD-10-CM | POA: Diagnosis not present

## 2016-06-22 DIAGNOSIS — I10 Essential (primary) hypertension: Secondary | ICD-10-CM | POA: Diagnosis not present

## 2016-06-22 DIAGNOSIS — E784 Other hyperlipidemia: Secondary | ICD-10-CM | POA: Diagnosis not present

## 2016-06-22 DIAGNOSIS — F322 Major depressive disorder, single episode, severe without psychotic features: Secondary | ICD-10-CM | POA: Diagnosis not present

## 2016-06-22 DIAGNOSIS — Z125 Encounter for screening for malignant neoplasm of prostate: Secondary | ICD-10-CM | POA: Diagnosis not present

## 2016-06-22 DIAGNOSIS — M810 Age-related osteoporosis without current pathological fracture: Secondary | ICD-10-CM | POA: Diagnosis not present

## 2016-06-28 ENCOUNTER — Other Ambulatory Visit (HOSPITAL_COMMUNITY): Payer: Self-pay | Admitting: Internal Medicine

## 2016-06-28 DIAGNOSIS — M542 Cervicalgia: Secondary | ICD-10-CM | POA: Diagnosis not present

## 2016-06-28 DIAGNOSIS — Z23 Encounter for immunization: Secondary | ICD-10-CM | POA: Diagnosis not present

## 2016-06-28 DIAGNOSIS — M810 Age-related osteoporosis without current pathological fracture: Secondary | ICD-10-CM | POA: Diagnosis not present

## 2016-06-28 DIAGNOSIS — M069 Rheumatoid arthritis, unspecified: Secondary | ICD-10-CM | POA: Diagnosis not present

## 2016-06-28 DIAGNOSIS — I8290 Acute embolism and thrombosis of unspecified vein: Secondary | ICD-10-CM | POA: Diagnosis not present

## 2016-06-28 DIAGNOSIS — K2970 Gastritis, unspecified, without bleeding: Secondary | ICD-10-CM | POA: Diagnosis not present

## 2016-06-28 DIAGNOSIS — Z0000 Encounter for general adult medical examination without abnormal findings: Secondary | ICD-10-CM | POA: Diagnosis not present

## 2016-06-28 DIAGNOSIS — B0229 Other postherpetic nervous system involvement: Secondary | ICD-10-CM | POA: Diagnosis not present

## 2016-06-28 DIAGNOSIS — Z1389 Encounter for screening for other disorder: Secondary | ICD-10-CM | POA: Diagnosis not present

## 2016-06-28 DIAGNOSIS — Z6823 Body mass index (BMI) 23.0-23.9, adult: Secondary | ICD-10-CM | POA: Diagnosis not present

## 2016-06-28 DIAGNOSIS — J449 Chronic obstructive pulmonary disease, unspecified: Secondary | ICD-10-CM | POA: Diagnosis not present

## 2016-06-28 DIAGNOSIS — J8410 Pulmonary fibrosis, unspecified: Secondary | ICD-10-CM | POA: Diagnosis not present

## 2016-07-07 DIAGNOSIS — F322 Major depressive disorder, single episode, severe without psychotic features: Secondary | ICD-10-CM | POA: Diagnosis not present

## 2016-07-14 DIAGNOSIS — F322 Major depressive disorder, single episode, severe without psychotic features: Secondary | ICD-10-CM | POA: Diagnosis not present

## 2016-07-26 DIAGNOSIS — F322 Major depressive disorder, single episode, severe without psychotic features: Secondary | ICD-10-CM | POA: Diagnosis not present

## 2016-08-02 DIAGNOSIS — L988 Other specified disorders of the skin and subcutaneous tissue: Secondary | ICD-10-CM | POA: Diagnosis not present

## 2016-08-02 DIAGNOSIS — Z6823 Body mass index (BMI) 23.0-23.9, adult: Secondary | ICD-10-CM | POA: Diagnosis not present

## 2016-08-02 DIAGNOSIS — F322 Major depressive disorder, single episode, severe without psychotic features: Secondary | ICD-10-CM | POA: Diagnosis not present

## 2016-08-02 DIAGNOSIS — F17200 Nicotine dependence, unspecified, uncomplicated: Secondary | ICD-10-CM | POA: Diagnosis not present

## 2016-08-02 DIAGNOSIS — R05 Cough: Secondary | ICD-10-CM | POA: Diagnosis not present

## 2016-08-02 DIAGNOSIS — J449 Chronic obstructive pulmonary disease, unspecified: Secondary | ICD-10-CM | POA: Diagnosis not present

## 2016-08-02 DIAGNOSIS — L139 Bullous disorder, unspecified: Secondary | ICD-10-CM | POA: Diagnosis not present

## 2016-08-02 DIAGNOSIS — M069 Rheumatoid arthritis, unspecified: Secondary | ICD-10-CM | POA: Diagnosis not present

## 2016-08-03 DIAGNOSIS — B019 Varicella without complication: Secondary | ICD-10-CM | POA: Diagnosis not present

## 2016-09-02 DIAGNOSIS — Z79899 Other long term (current) drug therapy: Secondary | ICD-10-CM | POA: Diagnosis not present

## 2016-09-02 DIAGNOSIS — M0570 Rheumatoid arthritis with rheumatoid factor of unspecified site without organ or systems involvement: Secondary | ICD-10-CM | POA: Diagnosis not present

## 2016-09-02 DIAGNOSIS — M79641 Pain in right hand: Secondary | ICD-10-CM | POA: Diagnosis not present

## 2016-09-02 DIAGNOSIS — M545 Low back pain: Secondary | ICD-10-CM | POA: Diagnosis not present

## 2016-09-02 DIAGNOSIS — N183 Chronic kidney disease, stage 3 (moderate): Secondary | ICD-10-CM | POA: Diagnosis not present

## 2016-09-02 DIAGNOSIS — M79642 Pain in left hand: Secondary | ICD-10-CM | POA: Diagnosis not present

## 2016-09-22 DIAGNOSIS — N183 Chronic kidney disease, stage 3 (moderate): Secondary | ICD-10-CM | POA: Diagnosis not present

## 2016-09-22 DIAGNOSIS — Z79899 Other long term (current) drug therapy: Secondary | ICD-10-CM | POA: Diagnosis not present

## 2016-09-22 DIAGNOSIS — M545 Low back pain: Secondary | ICD-10-CM | POA: Diagnosis not present

## 2016-09-22 DIAGNOSIS — M0570 Rheumatoid arthritis with rheumatoid factor of unspecified site without organ or systems involvement: Secondary | ICD-10-CM | POA: Diagnosis not present

## 2016-10-05 DIAGNOSIS — L958 Other vasculitis limited to the skin: Secondary | ICD-10-CM | POA: Diagnosis not present

## 2016-10-05 DIAGNOSIS — L0889 Other specified local infections of the skin and subcutaneous tissue: Secondary | ICD-10-CM | POA: Diagnosis not present

## 2016-10-05 DIAGNOSIS — B019 Varicella without complication: Secondary | ICD-10-CM | POA: Diagnosis not present

## 2016-10-05 DIAGNOSIS — R21 Rash and other nonspecific skin eruption: Secondary | ICD-10-CM | POA: Diagnosis not present

## 2016-10-06 DIAGNOSIS — Z79899 Other long term (current) drug therapy: Secondary | ICD-10-CM | POA: Diagnosis not present

## 2016-10-06 DIAGNOSIS — B0189 Other varicella complications: Secondary | ICD-10-CM | POA: Diagnosis not present

## 2016-10-06 DIAGNOSIS — A491 Streptococcal infection, unspecified site: Secondary | ICD-10-CM | POA: Diagnosis not present

## 2016-10-06 DIAGNOSIS — N183 Chronic kidney disease, stage 3 (moderate): Secondary | ICD-10-CM | POA: Diagnosis not present

## 2016-10-06 DIAGNOSIS — D696 Thrombocytopenia, unspecified: Secondary | ICD-10-CM | POA: Diagnosis not present

## 2016-10-13 DIAGNOSIS — B019 Varicella without complication: Secondary | ICD-10-CM | POA: Diagnosis not present

## 2016-10-28 DIAGNOSIS — N183 Chronic kidney disease, stage 3 (moderate): Secondary | ICD-10-CM | POA: Diagnosis not present

## 2016-10-28 DIAGNOSIS — M0570 Rheumatoid arthritis with rheumatoid factor of unspecified site without organ or systems involvement: Secondary | ICD-10-CM | POA: Diagnosis not present

## 2016-10-28 DIAGNOSIS — B0189 Other varicella complications: Secondary | ICD-10-CM | POA: Diagnosis not present

## 2016-10-29 DIAGNOSIS — I8290 Acute embolism and thrombosis of unspecified vein: Secondary | ICD-10-CM | POA: Diagnosis not present

## 2016-10-29 DIAGNOSIS — M069 Rheumatoid arthritis, unspecified: Secondary | ICD-10-CM | POA: Diagnosis not present

## 2016-10-29 DIAGNOSIS — I10 Essential (primary) hypertension: Secondary | ICD-10-CM | POA: Diagnosis not present

## 2016-10-29 DIAGNOSIS — Z6822 Body mass index (BMI) 22.0-22.9, adult: Secondary | ICD-10-CM | POA: Diagnosis not present

## 2016-10-29 DIAGNOSIS — J449 Chronic obstructive pulmonary disease, unspecified: Secondary | ICD-10-CM | POA: Diagnosis not present

## 2016-10-29 DIAGNOSIS — M545 Low back pain: Secondary | ICD-10-CM | POA: Diagnosis not present

## 2016-10-29 DIAGNOSIS — B0229 Other postherpetic nervous system involvement: Secondary | ICD-10-CM | POA: Diagnosis not present

## 2016-10-29 DIAGNOSIS — Z1389 Encounter for screening for other disorder: Secondary | ICD-10-CM | POA: Diagnosis not present

## 2016-11-01 DIAGNOSIS — L98499 Non-pressure chronic ulcer of skin of other sites with unspecified severity: Secondary | ICD-10-CM | POA: Diagnosis not present

## 2016-11-08 DIAGNOSIS — I82412 Acute embolism and thrombosis of left femoral vein: Secondary | ICD-10-CM | POA: Diagnosis not present

## 2016-11-17 DIAGNOSIS — M25442 Effusion, left hand: Secondary | ICD-10-CM | POA: Diagnosis not present

## 2016-11-17 DIAGNOSIS — M0570 Rheumatoid arthritis with rheumatoid factor of unspecified site without organ or systems involvement: Secondary | ICD-10-CM | POA: Diagnosis not present

## 2016-11-17 DIAGNOSIS — B0189 Other varicella complications: Secondary | ICD-10-CM | POA: Diagnosis not present

## 2016-11-17 DIAGNOSIS — M545 Low back pain: Secondary | ICD-10-CM | POA: Diagnosis not present

## 2016-11-17 DIAGNOSIS — M25441 Effusion, right hand: Secondary | ICD-10-CM | POA: Diagnosis not present

## 2017-01-03 DIAGNOSIS — M0570 Rheumatoid arthritis with rheumatoid factor of unspecified site without organ or systems involvement: Secondary | ICD-10-CM | POA: Diagnosis not present

## 2017-01-03 DIAGNOSIS — M545 Low back pain: Secondary | ICD-10-CM | POA: Diagnosis not present

## 2017-01-03 DIAGNOSIS — M79641 Pain in right hand: Secondary | ICD-10-CM | POA: Diagnosis not present

## 2017-01-03 DIAGNOSIS — Z79899 Other long term (current) drug therapy: Secondary | ICD-10-CM | POA: Diagnosis not present

## 2017-01-03 DIAGNOSIS — B0189 Other varicella complications: Secondary | ICD-10-CM | POA: Diagnosis not present

## 2017-01-03 DIAGNOSIS — M79642 Pain in left hand: Secondary | ICD-10-CM | POA: Diagnosis not present

## 2017-01-05 DIAGNOSIS — L821 Other seborrheic keratosis: Secondary | ICD-10-CM | POA: Diagnosis not present

## 2017-01-05 DIAGNOSIS — D225 Melanocytic nevi of trunk: Secondary | ICD-10-CM | POA: Diagnosis not present

## 2017-01-05 DIAGNOSIS — L958 Other vasculitis limited to the skin: Secondary | ICD-10-CM | POA: Diagnosis not present

## 2017-01-10 ENCOUNTER — Other Ambulatory Visit: Payer: Self-pay | Admitting: Internal Medicine

## 2017-01-10 DIAGNOSIS — R918 Other nonspecific abnormal finding of lung field: Secondary | ICD-10-CM

## 2017-01-25 ENCOUNTER — Ambulatory Visit
Admission: RE | Admit: 2017-01-25 | Discharge: 2017-01-25 | Disposition: A | Discharge status: Home / Self Care | Payer: Medicare Other | Source: Ambulatory Visit | Attending: Internal Medicine | Admitting: Internal Medicine

## 2017-01-25 DIAGNOSIS — R918 Other nonspecific abnormal finding of lung field: Secondary | ICD-10-CM | POA: Diagnosis not present

## 2017-01-31 DIAGNOSIS — Z79899 Other long term (current) drug therapy: Secondary | ICD-10-CM | POA: Diagnosis not present

## 2017-01-31 DIAGNOSIS — M545 Low back pain: Secondary | ICD-10-CM | POA: Diagnosis not present

## 2017-01-31 DIAGNOSIS — M0570 Rheumatoid arthritis with rheumatoid factor of unspecified site without organ or systems involvement: Secondary | ICD-10-CM | POA: Diagnosis not present

## 2017-02-28 DIAGNOSIS — I10 Essential (primary) hypertension: Secondary | ICD-10-CM | POA: Diagnosis not present

## 2017-02-28 DIAGNOSIS — B027 Disseminated zoster: Secondary | ICD-10-CM | POA: Diagnosis not present

## 2017-02-28 DIAGNOSIS — M810 Age-related osteoporosis without current pathological fracture: Secondary | ICD-10-CM | POA: Diagnosis not present

## 2017-02-28 DIAGNOSIS — J8410 Pulmonary fibrosis, unspecified: Secondary | ICD-10-CM | POA: Diagnosis not present

## 2017-02-28 DIAGNOSIS — Z6821 Body mass index (BMI) 21.0-21.9, adult: Secondary | ICD-10-CM | POA: Diagnosis not present

## 2017-02-28 DIAGNOSIS — F17200 Nicotine dependence, unspecified, uncomplicated: Secondary | ICD-10-CM | POA: Diagnosis not present

## 2017-02-28 DIAGNOSIS — J449 Chronic obstructive pulmonary disease, unspecified: Secondary | ICD-10-CM | POA: Diagnosis not present

## 2017-02-28 DIAGNOSIS — M069 Rheumatoid arthritis, unspecified: Secondary | ICD-10-CM | POA: Diagnosis not present

## 2017-03-04 DIAGNOSIS — M150 Primary generalized (osteo)arthritis: Secondary | ICD-10-CM | POA: Diagnosis not present

## 2017-03-04 DIAGNOSIS — M2550 Pain in unspecified joint: Secondary | ICD-10-CM | POA: Diagnosis not present

## 2017-03-04 DIAGNOSIS — R5382 Chronic fatigue, unspecified: Secondary | ICD-10-CM | POA: Diagnosis not present

## 2017-03-04 DIAGNOSIS — M0579 Rheumatoid arthritis with rheumatoid factor of multiple sites without organ or systems involvement: Secondary | ICD-10-CM | POA: Diagnosis not present

## 2017-03-04 DIAGNOSIS — M5030 Other cervical disc degeneration, unspecified cervical region: Secondary | ICD-10-CM | POA: Diagnosis not present

## 2017-03-23 DIAGNOSIS — M0579 Rheumatoid arthritis with rheumatoid factor of multiple sites without organ or systems involvement: Secondary | ICD-10-CM | POA: Diagnosis not present

## 2017-04-04 DIAGNOSIS — L853 Xerosis cutis: Secondary | ICD-10-CM | POA: Diagnosis not present

## 2017-04-04 DIAGNOSIS — B019 Varicella without complication: Secondary | ICD-10-CM | POA: Diagnosis not present

## 2017-04-04 DIAGNOSIS — L570 Actinic keratosis: Secondary | ICD-10-CM | POA: Diagnosis not present

## 2017-04-04 DIAGNOSIS — L905 Scar conditions and fibrosis of skin: Secondary | ICD-10-CM | POA: Diagnosis not present

## 2017-04-04 DIAGNOSIS — L723 Sebaceous cyst: Secondary | ICD-10-CM | POA: Diagnosis not present

## 2017-04-04 DIAGNOSIS — L821 Other seborrheic keratosis: Secondary | ICD-10-CM | POA: Diagnosis not present

## 2017-04-25 DIAGNOSIS — Z79899 Other long term (current) drug therapy: Secondary | ICD-10-CM | POA: Diagnosis not present

## 2017-04-25 DIAGNOSIS — M0579 Rheumatoid arthritis with rheumatoid factor of multiple sites without organ or systems involvement: Secondary | ICD-10-CM | POA: Diagnosis not present

## 2017-06-08 DIAGNOSIS — M0579 Rheumatoid arthritis with rheumatoid factor of multiple sites without organ or systems involvement: Secondary | ICD-10-CM | POA: Diagnosis not present

## 2017-06-08 DIAGNOSIS — R5382 Chronic fatigue, unspecified: Secondary | ICD-10-CM | POA: Diagnosis not present

## 2017-06-08 DIAGNOSIS — B019 Varicella without complication: Secondary | ICD-10-CM | POA: Diagnosis not present

## 2017-06-08 DIAGNOSIS — M150 Primary generalized (osteo)arthritis: Secondary | ICD-10-CM | POA: Diagnosis not present

## 2017-06-08 DIAGNOSIS — Z6822 Body mass index (BMI) 22.0-22.9, adult: Secondary | ICD-10-CM | POA: Diagnosis not present

## 2017-06-08 DIAGNOSIS — M2550 Pain in unspecified joint: Secondary | ICD-10-CM | POA: Diagnosis not present

## 2017-06-08 DIAGNOSIS — M5030 Other cervical disc degeneration, unspecified cervical region: Secondary | ICD-10-CM | POA: Diagnosis not present

## 2017-07-19 ENCOUNTER — Other Ambulatory Visit: Payer: Self-pay | Admitting: Internal Medicine

## 2017-07-19 DIAGNOSIS — R918 Other nonspecific abnormal finding of lung field: Secondary | ICD-10-CM

## 2017-07-21 DIAGNOSIS — J449 Chronic obstructive pulmonary disease, unspecified: Secondary | ICD-10-CM | POA: Diagnosis not present

## 2017-07-21 DIAGNOSIS — J181 Lobar pneumonia, unspecified organism: Secondary | ICD-10-CM | POA: Diagnosis not present

## 2017-07-21 DIAGNOSIS — R05 Cough: Secondary | ICD-10-CM | POA: Diagnosis not present

## 2017-07-21 DIAGNOSIS — I499 Cardiac arrhythmia, unspecified: Secondary | ICD-10-CM | POA: Diagnosis not present

## 2017-07-21 DIAGNOSIS — Z6820 Body mass index (BMI) 20.0-20.9, adult: Secondary | ICD-10-CM | POA: Diagnosis not present

## 2017-07-28 ENCOUNTER — Ambulatory Visit
Admission: RE | Admit: 2017-07-28 | Discharge: 2017-07-28 | Disposition: A | Discharge status: Home / Self Care | Payer: Medicare Other | Source: Ambulatory Visit | Attending: Internal Medicine | Admitting: Internal Medicine

## 2017-07-28 DIAGNOSIS — R918 Other nonspecific abnormal finding of lung field: Secondary | ICD-10-CM

## 2017-07-28 DIAGNOSIS — R911 Solitary pulmonary nodule: Secondary | ICD-10-CM | POA: Diagnosis not present

## 2017-08-01 ENCOUNTER — Telehealth: Payer: Self-pay | Admitting: *Deleted

## 2017-08-01 DIAGNOSIS — R911 Solitary pulmonary nodule: Secondary | ICD-10-CM | POA: Insufficient documentation

## 2017-08-01 NOTE — Telephone Encounter (Signed)
Oncology Nurse Navigator Documentation  Oncology Nurse Navigator Flowsheets 08/01/2017  Navigator Location [facility]  Navigator Encounter Type Telephone/I received referral today. I updated Dr. Julien Nordmann on referral.  I called and scheduled patient to be seen on 08/08/17.  He and wife verbalized understanding of appt time and place.   Telephone Outgoing Call  Treatment Phase Abnormal Scans  Barriers/Navigation Needs Coordination of Care  Interventions Coordination of Care  Coordination of Care Appts  Acuity Level 1  Time Spent with Patient 15

## 2017-08-01 NOTE — Telephone Encounter (Signed)
Oncology Nurse Navigator Documentation  Oncology Nurse Navigator Flowsheets 08/01/2017  Navigator Location [facility]  Referral date to RadOnc/MedOnc 08/01/2017  Navigator Encounter Type Telephone/I received referral today on Kenneth [name]. I called his home and mobil phone. I was unable to reach him and left vm message for him to call me with my name and phone number.   Telephone Outgoing Call  Treatment Phase Abnormal Scans  Barriers/Navigation Needs Coordination of Care  Interventions Coordination of Care  Coordination of Care Other  Acuity Level 2  Time Spent with Patient 15

## 2017-08-08 ENCOUNTER — Ambulatory Visit (HOSPITAL_BASED_OUTPATIENT_CLINIC_OR_DEPARTMENT_OTHER): Payer: Medicare Other | Admitting: Oncology

## 2017-08-08 ENCOUNTER — Encounter: Payer: Self-pay | Admitting: Oncology

## 2017-08-08 ENCOUNTER — Telehealth: Payer: Self-pay | Admitting: Oncology

## 2017-08-08 ENCOUNTER — Other Ambulatory Visit (HOSPITAL_BASED_OUTPATIENT_CLINIC_OR_DEPARTMENT_OTHER): Payer: Medicare Other

## 2017-08-08 VITALS — BP 153/66 | HR 88 | Temp 98.2°F | Resp 20 | Ht 70.0 in | Wt 148.3 lb

## 2017-08-08 DIAGNOSIS — R911 Solitary pulmonary nodule: Secondary | ICD-10-CM

## 2017-08-08 DIAGNOSIS — I10 Essential (primary) hypertension: Secondary | ICD-10-CM | POA: Diagnosis not present

## 2017-08-08 DIAGNOSIS — Z800 Family history of malignant neoplasm of digestive organs: Secondary | ICD-10-CM | POA: Diagnosis not present

## 2017-08-08 DIAGNOSIS — Z809 Family history of malignant neoplasm, unspecified: Secondary | ICD-10-CM

## 2017-08-08 DIAGNOSIS — R0609 Other forms of dyspnea: Secondary | ICD-10-CM

## 2017-08-08 DIAGNOSIS — J449 Chronic obstructive pulmonary disease, unspecified: Secondary | ICD-10-CM | POA: Diagnosis not present

## 2017-08-08 DIAGNOSIS — R634 Abnormal weight loss: Secondary | ICD-10-CM

## 2017-08-08 DIAGNOSIS — M069 Rheumatoid arthritis, unspecified: Secondary | ICD-10-CM | POA: Diagnosis not present

## 2017-08-08 DIAGNOSIS — Z803 Family history of malignant neoplasm of breast: Secondary | ICD-10-CM

## 2017-08-08 DIAGNOSIS — Z801 Family history of malignant neoplasm of trachea, bronchus and lung: Secondary | ICD-10-CM

## 2017-08-08 DIAGNOSIS — Z720 Tobacco use: Secondary | ICD-10-CM | POA: Diagnosis not present

## 2017-08-08 DIAGNOSIS — I82402 Acute embolism and thrombosis of unspecified deep veins of left lower extremity: Secondary | ICD-10-CM

## 2017-08-08 DIAGNOSIS — R05 Cough: Secondary | ICD-10-CM

## 2017-08-08 LAB — CBC WITH DIFFERENTIAL/PLATELET
BASO%: 0.8 % (ref 0.0–2.0)
Basophils Absolute: 0.1 10*3/uL (ref 0.0–0.1)
EOS%: 0.9 % (ref 0.0–7.0)
Eosinophils Absolute: 0.1 10*3/uL (ref 0.0–0.5)
HEMATOCRIT: 40.9 % (ref 38.4–49.9)
HEMOGLOBIN: 13.7 g/dL (ref 13.0–17.1)
LYMPH%: 11.4 % — ABNORMAL LOW (ref 14.0–49.0)
MCH: 40.7 pg — ABNORMAL HIGH (ref 27.2–33.4)
MCHC: 33.6 g/dL (ref 32.0–36.0)
MCV: 121.4 fL — ABNORMAL HIGH (ref 79.3–98.0)
MONO#: 0.3 10*3/uL (ref 0.1–0.9)
MONO%: 4.7 % (ref 0.0–14.0)
NEUT%: 82.2 % — ABNORMAL HIGH (ref 39.0–75.0)
NEUTROS ABS: 5.3 10*3/uL (ref 1.5–6.5)
PLATELETS: 228 10*3/uL (ref 140–400)
RBC: 3.37 10*6/uL — ABNORMAL LOW (ref 4.20–5.82)
RDW: 14.2 % (ref 11.0–14.6)
WBC: 6.4 10*3/uL (ref 4.0–10.3)
lymph#: 0.7 10*3/uL — ABNORMAL LOW (ref 0.9–3.3)

## 2017-08-08 LAB — COMPREHENSIVE METABOLIC PANEL
ALBUMIN: 3 g/dL — AB (ref 3.5–5.0)
ALK PHOS: 84 U/L (ref 40–150)
ALT: 17 U/L (ref 0–55)
ANION GAP: 9 meq/L (ref 3–11)
AST: 21 U/L (ref 5–34)
BILIRUBIN TOTAL: 0.69 mg/dL (ref 0.20–1.20)
BUN: 14.7 mg/dL (ref 7.0–26.0)
CALCIUM: 9.5 mg/dL (ref 8.4–10.4)
CHLORIDE: 107 meq/L (ref 98–109)
CO2: 25 mEq/L (ref 22–29)
CREATININE: 1.2 mg/dL (ref 0.7–1.3)
EGFR: 60 mL/min/{1.73_m2} — ABNORMAL LOW (ref >60)
Glucose: 103 mg/dl (ref 70–140)
Potassium: 5 mEq/L (ref 3.5–5.1)
Sodium: 141 mEq/L (ref 136–145)
TOTAL PROTEIN: 6.9 g/dL (ref 6.4–8.3)

## 2017-08-08 NOTE — Assessment & Plan Note (Addendum)
This is a pleasant 71 year old white male with a left lower lobe lung nodule measuring 10 mm diagnosed in October 2018. The patient has weight loss and cough.   The patient was seen with Dr. Julien Nordmann who had a lengthy discussion with the patient and his sister-in-law today about his CT scan and further workup. CT images were shown to the patient and his sister-in-law. Discussed with them that this lesion is suspicious for lung cancer. We do need further workup to confirm this though. The patient will have a PET scan within the next one to 2 weeks. Pending these results, we will consider referring the patient for a biopsy. Explained to the patient that if just this one area were to light up on the PET scan, then he would have early stage disease and may be able to undergo surgery if he is a surgical candidate versus radiation. Further discussion will be had pending the results of the PET scan and possible biopsy.  The patient has been scheduled back for a discussion of his PET scan results in approximately 2 weeks.  The patient was encouraged to quit smoking.

## 2017-08-08 NOTE — Telephone Encounter (Signed)
Scheduled appt per 10/22 los [facility] radiology to contact patient with PET schedule.

## 2017-08-08 NOTE — Progress Notes (Signed)
[facility] Initial Visit:  Patient Care Team: [name], MD as PCP - General (Internal Medicine)  REASON FOR CONSULTATION: 71-year-old white male with a left lower lobe lung nodule.   No history exists.    HPI: Kenneth [name] 71 y.o. male presents to clinic today with his sister-in-law. Kenneth [name] has a past medical history significant for hypertension, COPD, rheumatoid arthritis, hyperlipidemia, gout, left lower extremity DVT diagnosed in July 9379 as a complication of that hospitalization., And history of smoking one half to one pack of cigarettes per day for approximately 50 years. The patient has been followed since 2006 with CT scans for left upper lobe lung nodules. These have remained stable. His primary care provider and got a CT of the chest in April 2018. His CT showed a new 6 mm nodular opacity in the left lower lobe. A repeat CT scan of the chest in October 2018 showed that the nodule in the left lower lobe was enlarging and measured 10 mm. The patient was referred to [place] for further evaluation and recommendations of this lung nodule.  When seen today, the patient reports a weight loss of about 10 pounds in the past 3-4 months. He reports that his appetite is fair. He reports that he has very little energy. He reports a cough without hemoptysis. He denies shortness of breath. Denies fevers and chills. Denies chest pain. Denies nausea, vomiting, constipation, diarrhea. Family history is significant for a father who died from small cell lung cancer, a paternal aunt that had lung cancer who was a nonsmoker, a maternal aunt with breast cancer, a maternal aunt with pancreatic cancer, a mother and maternal aunt with pulmonary fibrosis, a brother with colon cancer, and another brother who died with widespread metastatic cancer, primary unknown. His wife is currently being treated for ALL at [facility]. The patient used to work as a Therapist, music for [employer].  He continues to smoke approximate 1 pack per day. Denies alcohol and illicit drug use.  Review of Systems  Constitutional: Positive for appetite change, fatigue and unexpected weight change. Negative for chills and fever.  HENT:  Negative.   Eyes: Negative.   Respiratory: Positive for cough. Negative for hemoptysis, shortness of breath and wheezing.   Cardiovascular: Negative.   Gastrointestinal: Negative.   Genitourinary: Negative.    Musculoskeletal:       Multiple arthralgias due to rheumatoid arthritis.  Skin: Negative.   Neurological: Negative.   Hematological: Negative.   Psychiatric/Behavioral: Negative.     MEDICAL HISTORY: Past Medical History:  Diagnosis Date  . Abnormal LFTs   . Anxiety   . COPD (chronic obstructive pulmonary disease) (HCC)    emphysema.  . CTS (carpal tunnel syndrome)    Left  . DDD (degenerative disc disease)   . Depression    mild  . Elevated MCV   . Emphysema of lung ([place])   . Gout   . Gynecomastia   . Hearing loss   . Hyperlipidemia   . Microhematuria   . Nephrolithiasis   . Ocular migraine   . Panic attack   . Rheumatoid arthritis(714.0) dx 2000  . Situational hypertension   . Tobacco abuse     SURGICAL HISTORY: Past Surgical History:  Procedure Laterality Date  . APPENDECTOMY    . COLONOSCOPY    . CYSTOSTOMY W/ BLADDER BIOPSY  2006  . ESOPHAGOGASTRODUODENOSCOPY N/A 04/30/2016   Procedure: ESOPHAGOGASTRODUODENOSCOPY (EGD);  Surgeon: Doran Stabler, MD;  Location: [facility];  Service: Endoscopy;  Laterality: N/A;    SOCIAL HISTORY: Social History   Social History  . Marital status: Married    Spouse name: Mary  . Number of children: 1  . Years of education: N/A   Occupational History  .    [employer] MAINTENANCE Dealer Unemployed   Social History Main Topics  . Smoking status: Current Every Day Smoker    Packs/day: 0.50    Years: 44.00    Types: Cigarettes  . Smokeless tobacco: Never Used  . Alcohol use No  .  Drug use: No  . Sexual activity: Not on file   Other Topics Concern  . Not on file   Social History Narrative   Married since 1974   2 years at [place] History  Problem Relation Age of Onset  . Lung cancer Father   . Asthma Paternal Grandmother   . Lung cancer Paternal Aunt   . Breast cancer Maternal Aunt   . Breast cancer Maternal Aunt   . Stomach cancer Maternal Uncle   . Colon cancer Neg Hx     ALLERGIES:  is allergic to aspirin and naproxen sodium.  MEDICATIONS:  Current Outpatient Prescriptions  Medication Sig Dispense Refill  . budesonide-formoterol (SYMBICORT) 80-4.5 MCG/ACT inhaler Inhale 2 puffs into the lungs 2 (two) times daily.    . cholecalciferol (VITAMIN D) 1000 UNITS tablet Take 1,000 Units by mouth daily.      . clonazePAM (KLONOPIN) 0.5 MG tablet Take 0.5 mg by mouth daily.     . fluticasone (FLONASE) 50 MCG/ACT nasal spray Place 2 sprays into the nose daily.      . folic acid (FOLVITE) 1 MG tablet Take 1 mg by mouth daily.      [employer] HYDROcodone-acetaminophen (VICODIN) 5-500 MG per tablet Take 1 tablet by mouth 2 (two) times daily as needed for pain.     . methotrexate (RHEUMATREX) 2.5 MG tablet Take 7.5 mg by mouth 2 (two) times a week. Take 3 tabs every Friday 3 tabs on Sunday    . nystatin (MYCOSTATIN) 100000 UNIT/ML suspension Take 5 mLs (500,000 Units total) by mouth 4 (four) times daily. 160 mL 0  . pantoprazole (PROTONIX) 40 MG tablet Take 1 tablet (40 mg total) by mouth daily. 30 tablet 0  . rosuvastatin (CRESTOR) 20 MG tablet Take 10 mg by mouth daily.      . Tiotropium Bromide Monohydrate (SPIRIVA RESPIMAT) 1.25 MCG/ACT AERS Inhale 2 puffs into the lungs daily.    . zoledronic acid (RECLAST) 5 MG/100ML SOLN Inject 5 mg into the vein once. Every 2 years     . acetaminophen (TYLENOL) 325 MG tablet Take 325 mg by mouth every 4 (four) hours as needed for mild pain.     [employer] Dextromethorphan-Guaifenesin (TUSSIN DM PO) Take 1 tablet by  mouth every 6 (six) hours as needed (cough and cold).     . predniSONE (DELTASONE) 10 MG tablet Take 10 mg by mouth daily.  1   No current facility-administered medications for this visit.     PHYSICAL EXAMINATION:  ECOG PERFORMANCE STATUS: 1 - Symptomatic but completely ambulatory   Vitals:   08/08/17 1305  BP: (!) 153/66  Pulse: 88  Resp: 20  Temp: 98.2 F (36.8 C)  SpO2: 96%    Filed Weights   08/08/17 1305  Weight: 148 lb 4.8 oz (67.3 kg)     Physical Exam  Constitutional: He is oriented  to person, place, and time and well-developed, well-nourished, and in no distress. No distress.  HENT:  Head: Normocephalic and atraumatic.  Mouth/Throat: Oropharynx is clear and moist. No oropharyngeal exudate.  Eyes: Pupils are equal, round, and reactive to light. Conjunctivae and EOM are normal. Right eye exhibits no discharge. Left eye exhibits no discharge. No scleral icterus.  Neck: Normal range of motion. Neck supple.  Cardiovascular: Normal rate, regular rhythm, normal heart sounds and intact distal pulses.   Pulmonary/Chest: Effort normal and breath sounds normal. No respiratory distress. He has no wheezes. He has no rales.  Abdominal: Soft. Bowel sounds are normal. He exhibits no distension and no mass. There is no tenderness.  Musculoskeletal: Normal range of motion. He exhibits no edema.  RA Changes to his bilateral hands.  Lymphadenopathy:    He has no cervical adenopathy.  Neurological: He is alert and oriented to person, place, and time. No cranial nerve deficit. He exhibits normal muscle tone. Gait normal. Coordination normal.  Skin: Skin is warm and dry. No rash noted. He is not diaphoretic. No erythema. No pallor.  Psychiatric: Mood, memory, affect and judgment normal.  Vitals reviewed.    LABORATORY DATA: I have personally reviewed the data as listed:  Appointment on 08/08/2017  Component Date Value Ref Range Status  . WBC 08/08/2017 6.4  4.0 - 10.3 10e3/uL  Final  . NEUT# 08/08/2017 5.3  1.5 - 6.5 10e3/uL Final  . HGB 08/08/2017 13.7  13.0 - 17.1 g/dL Final  . HCT 08/08/2017 40.9  38.4 - 49.9 % Final  . Platelets 08/08/2017 228  140 - 400 10e3/uL Final  . MCV 08/08/2017 121.4* 79.3 - 98.0 fL Final  . MCH 08/08/2017 40.7* 27.2 - 33.4 pg Final  . MCHC 08/08/2017 33.6  32.0 - 36.0 g/dL Final  . RBC 08/08/2017 3.37* 4.20 - 5.82 10e6/uL Final  . RDW 08/08/2017 14.2  11.0 - 14.6 % Final  . lymph# 08/08/2017 0.7* 0.9 - 3.3 10e3/uL Final  . MONO# 08/08/2017 0.3  0.1 - 0.9 10e3/uL Final  . Eosinophils Absolute 08/08/2017 0.1  0.0 - 0.5 10e3/uL Final  . Basophils Absolute 08/08/2017 0.1  0.0 - 0.1 10e3/uL Final  . NEUT% 08/08/2017 82.2* 39.0 - 75.0 % Final  . LYMPH% 08/08/2017 11.4* 14.0 - 49.0 % Final  . MONO% 08/08/2017 4.7  0.0 - 14.0 % Final  . EOS% 08/08/2017 0.9  0.0 - 7.0 % Final  . BASO% 08/08/2017 0.8  0.0 - 2.0 % Final  . Sodium 08/08/2017 141  136 - 145 mEq/L Final  . Potassium 08/08/2017 5.0  3.5 - 5.1 mEq/L Final  . Chloride 08/08/2017 107  98 - 109 mEq/L Final  . CO2 08/08/2017 25  22 - 29 mEq/L Final  . Glucose 08/08/2017 103  70 - 140 mg/dl Final   Glucose reference range is for nonfasting patients. Fasting glucose reference range is 70- 100.  [employer] BUN 08/08/2017 14.7  7.0 - 26.0 mg/dL Final  . Creatinine 08/08/2017 1.2  0.7 - 1.3 mg/dL Final  . Total Bilirubin 08/08/2017 0.69  0.20 - 1.20 mg/dL Final  . Alkaline Phosphatase 08/08/2017 84  40 - 150 U/L Final  . AST 08/08/2017 21  5 - 34 U/L Final  . ALT 08/08/2017 17  0 - 55 U/L Final  . Total Protein 08/08/2017 6.9  6.4 - 8.3 g/dL Final  . Albumin 08/08/2017 3.0* 3.5 - 5.0 g/dL Final  . Calcium 08/08/2017 9.5  8.4 - 10.4 mg/dL  Final  . Anion Gap 08/08/2017 9  3 - 11 mEq/L Final  . EGFR 08/08/2017 60* >60 ml/min/1.73 m2 Final   eGFR is calculated using the CKD-EPI Creatinine Equation (2009)    RADIOGRAPHIC STUDIES: I have personally reviewed the radiological images as listed  and agree with the findings in the report  No results found.  Ct Chest Wo Contrast  Result Date: 07/28/2017 CLINICAL DATA:  Nodule follow-up left lower lobe.  Current smoker. EXAM: CT CHEST WITHOUT CONTRAST TECHNIQUE: Multidetector CT imaging of the chest was performed following the standard protocol without IV contrast. COMPARISON:  Chest CTs dated 01/25/2017 and 05/20/2014. FINDINGS: Cardiovascular: Heart size is normal. No pericardial effusion. Coronary artery calcifications noted. Aortic atherosclerosis. No aortic aneurysm. Mediastinum/Nodes: No mass or enlarged lymph nodes within the mediastinum or perihilar regions. Esophagus appears normal. Lungs/Pleura: The diffuse bilateral emphysematous change, moderate to severe in degree, is stable. The 7 mm pulmonary nodule within the left upper lobe, lateral aspect, is stable (series 4, image 26). The irregular nodular density within the left upper lobe, posterior aspect, is stable compared to the previous exams suggesting chronic nodular scarring/fibrosis. The irregular nodular density within the left lower lobe, posterior aspect, has increased in size with a current measurement of 10 mm (measured 6 mm on the most recent prior study, new compared to the earlier exams) (series 4, image 76). No new pulmonary nodules.  No pleural effusion or pneumothorax. Upper Abdomen: No acute findings. Musculoskeletal: Mild degenerative spurring within the thoracic spine. No acute or suspicious osseous finding. IMPRESSION: 1. Enlarging nodule within the left lower lobe, posterior aspect, current measurement of 10 mm (measured 6 mm on the most recent chest CT of 01/25/2017, new compared to earlier exams). This is highly suspicious for a neoplastic nodule. Recommend tissue sampling for definitive characterization. If further imaging characterization is desired for confirmation, consider PET-CT. 2. Stable pulmonary nodule within the left upper lobe. Additional irregular nodule  within the left upper lobe is also stable compared to multiple prior studies suggesting chronic nodular scarring/fibrosis. No new pulmonary nodules. 3. Severe emphysematous change. 4. Coronary artery calcifications. Recommend correlation with any possible associated cardiac symptoms. 5. Aortic atherosclerosis. Emphysema (ICD10-J43.9). These results will be called to the ordering clinician or representative by the Radiologist Assistant, and communication documented in the PACS or zVision Dashboard. Electronically Signed   By: Franki Cabot M.D.   On: 07/28/2017 15:03    ASSESSMENT/PLAN    Lung nodule This is a pleasant 71 year old white male with a left lower lobe lung nodule measuring 10 mm diagnosed in October 2018. The patient has weight loss and cough.   The patient was seen with Dr. Julien Nordmann who had a lengthy discussion with the patient and his sister-in-law today about his CT scan and further workup. CT images were shown to the patient and his sister-in-law. Discussed with them that this lesion is suspicious for lung cancer. We do need further workup to confirm this though. The patient will have a PET scan within the next one to 2 weeks. Pending these results, we will consider referring the patient for a biopsy. Explained to the patient that if just this one area were to light up on the PET scan, then he would have early stage disease and may be able to undergo surgery if he is a surgical candidate versus radiation. Further discussion will be had pending the results of the PET scan and possible biopsy.  The patient has been scheduled back for a  discussion of his PET scan results in approximately 2 weeks.  The patient was encouraged to quit smoking.   Orders Placed This Encounter  Procedures  . NM PET Image Initial (PI) Skull Base To Thigh    Standing Status:   Future    Standing Expiration Date:   08/08/2018    Order Specific Question:   If indicated for the ordered procedure, I authorize  the administration of a radiopharmaceutical per Radiology protocol    Answer:   Yes    Order Specific Question:   Preferred imaging location?    Answer:   [facility]    Order Specific Question:   Radiology Contrast Protocol - do NOT remove file path    Answer:   [email or web address]    Order Specific Question:   Reason for Exam additional comments    Answer:   Lung nodule. Suspicious for lung cancer. Eval for mets.    All questions were answered. The patient knows to call the clinic with any problems, questions or concerns.    Mikey Bussing, NP  08/08/2017 4:01 PM   ADDENDUM: Hematology/Oncology Attending: I had a face to face encounter with the patient today. I recommended his care plan. This is a very pleasant 71 years old white male with long history of smoking who was noted to have few pulmonary nodules for few years. He was followed by repeat imaging studies and most recent CT scan of the chest on 07/28/2017 showed ingular nodule or density within the left lower lobe in the posterior aspect that has increased in size and measured 1.0 cm compared to 0.6 cm on the previous study. The patient also has a stable 0.7 cm pulmonary nodule within the left upper lobe lateral aspect. Dr. Joylene Draft kindly referred the patient to me today for evaluation and recommendation regarding his condition. When seen today the patient is feeling fine with no specific complaints except for the shortness breath with exertion. His wife was recently diagnosed with acute myeloid leukemia and she is currently receiving treatment [facility]. I met the patient with his wife 2 months ago when I made her diagnosis of the acute myeloid leukemia. I had a lengthy discussion with the patient about his condition. I personally and independently reviewed the scan images and discuss the results and showed the images to the patient today. I recommended for him to have a PET scan for  further evaluation of this enlarging left lower lobe pulmonary nodule. I will see him back for follow-up visit in 2 weeks for further evaluation and recommendation regarding his condition. If the nodule is hypermetabolic on the upcoming PET scan, I would consider the patient for referral to cardiothoracic surgery if he is a good surgical candidate or CT-guided core biopsy followed by stereotactic radiotherapy. The patient was advised to call immediately if he has any concerning symptoms in the interval.  Disclaimer: This note was dictated with voice recognition software. Similar sounding words can inadvertently be transcribed and may be missed upon review. Eilleen Kempf, MD 08/08/17

## 2017-08-20 ENCOUNTER — Ambulatory Visit (HOSPITAL_COMMUNITY)
Admission: RE | Admit: 2017-08-20 | Discharge: 2017-08-20 | Disposition: A | Discharge status: Home / Self Care | Payer: Medicare Other | Source: Ambulatory Visit | Attending: Oncology | Admitting: Oncology

## 2017-08-20 DIAGNOSIS — R911 Solitary pulmonary nodule: Secondary | ICD-10-CM | POA: Insufficient documentation

## 2017-08-20 DIAGNOSIS — Z79899 Other long term (current) drug therapy: Secondary | ICD-10-CM | POA: Diagnosis not present

## 2017-08-20 DIAGNOSIS — I700 Atherosclerosis of aorta: Secondary | ICD-10-CM | POA: Insufficient documentation

## 2017-08-20 DIAGNOSIS — K829 Disease of gallbladder, unspecified: Secondary | ICD-10-CM | POA: Insufficient documentation

## 2017-08-20 DIAGNOSIS — J439 Emphysema, unspecified: Secondary | ICD-10-CM | POA: Insufficient documentation

## 2017-08-20 DIAGNOSIS — N200 Calculus of kidney: Secondary | ICD-10-CM | POA: Diagnosis not present

## 2017-08-20 LAB — GLUCOSE, CAPILLARY: GLUCOSE-CAPILLARY: 84 mg/dL (ref 65–99)

## 2017-08-20 MED ORDER — FLUDEOXYGLUCOSE F - 18 (FDG) INJECTION
7.8000 | Freq: Once | INTRAVENOUS | Status: AC | PRN
  Administered 2017-08-20: 7.8 via INTRAVENOUS

## 2017-08-22 ENCOUNTER — Ambulatory Visit (HOSPITAL_BASED_OUTPATIENT_CLINIC_OR_DEPARTMENT_OTHER): Payer: Medicare Other | Admitting: Oncology

## 2017-08-22 ENCOUNTER — Telehealth: Payer: Self-pay | Admitting: Oncology

## 2017-08-22 VITALS — BP 140/82 | HR 98 | Temp 98.3°F | Resp 18 | Ht 70.0 in | Wt 145.1 lb

## 2017-08-22 DIAGNOSIS — R911 Solitary pulmonary nodule: Secondary | ICD-10-CM

## 2017-08-22 DIAGNOSIS — J449 Chronic obstructive pulmonary disease, unspecified: Secondary | ICD-10-CM

## 2017-08-22 DIAGNOSIS — Z87891 Personal history of nicotine dependence: Secondary | ICD-10-CM | POA: Diagnosis not present

## 2017-08-22 DIAGNOSIS — R05 Cough: Secondary | ICD-10-CM

## 2017-08-22 NOTE — Telephone Encounter (Signed)
Scheduled appt per 11/5 los - Gave patient AVS and calender per los. Left message for PFT to contact patient and - Central radiology to contact patient regarding ct.

## 2017-08-22 NOTE — Progress Notes (Signed)
[facility] OFFICE PROGRESS NOTE  [name], MD [place] [address]  DIAGNOSIS: Left lower lobe lung nodule measuring 10 mm highly suspicious for lung cancer diagnosed in October 2018.   PRIOR THERAPY: None  CURRENT THERAPY: None  INTERVAL HISTORY: Kenneth [name] 71 y.o. male returns for routine follow-up visit accompanied by his family members.  The patient is feeling fine today with the exception of ongoing nonproductive cough.  The patient denies fevers and chills.  Denies chest pain, shortness of breath, hemoptysis.  Denies nausea, vomiting, constipation, diarrhea.  The patient had a recent PET scan and is here to discuss the results.  MEDICAL HISTORY: Past Medical History:  Diagnosis Date  . Abnormal LFTs   . Anxiety   . COPD (chronic obstructive pulmonary disease) (HCC)    emphysema.  . CTS (carpal tunnel syndrome)    Left  . DDD (degenerative disc disease)   . Depression    mild  . Elevated MCV   . Emphysema of lung ([place])   . Gout   . Gynecomastia   . Hearing loss   . Hyperlipidemia   . Microhematuria   . Nephrolithiasis   . Ocular migraine   . Panic attack   . Rheumatoid arthritis(714.0) dx 2000  . Situational hypertension   . Tobacco abuse     ALLERGIES:  is allergic to aspirin and naproxen sodium.  MEDICATIONS:  Current Outpatient Medications  Medication Sig Dispense Refill  . acetaminophen (TYLENOL) 325 MG tablet Take 325 mg by mouth every 4 (four) hours as needed for mild pain.     . budesonide-formoterol (SYMBICORT) 80-4.5 MCG/ACT inhaler Inhale 2 puffs into the lungs 2 (two) times daily.    . cholecalciferol (VITAMIN D) 1000 UNITS tablet Take 1,000 Units by mouth daily.      . clonazePAM (KLONOPIN) 0.5 MG tablet Take 0.5 mg by mouth daily.     Marland Kitchen Dextromethorphan-Guaifenesin (TUSSIN DM PO) Take 1 tablet by mouth every 6 (six) hours as needed (cough and cold).     . fluticasone (FLONASE) 50 MCG/ACT nasal spray Place 2  sprays into the nose daily.      . folic acid (FOLVITE) 1 MG tablet Take 1 mg by mouth daily.      Marland Kitchen HYDROcodone-acetaminophen (VICODIN) 5-500 MG per tablet Take 1 tablet by mouth 2 (two) times daily as needed for pain.     . methotrexate (RHEUMATREX) 2.5 MG tablet Take 7.5 mg by mouth 2 (two) times a week. Take 3 tabs every Friday 3 tabs on Sunday    . nystatin (MYCOSTATIN) 100000 UNIT/ML suspension Take 5 mLs (500,000 Units total) by mouth 4 (four) times daily. 160 mL 0  . pantoprazole (PROTONIX) 40 MG tablet Take 1 tablet (40 mg total) by mouth daily. 30 tablet 0  . predniSONE (DELTASONE) 10 MG tablet Take 10 mg by mouth daily.  1  . rosuvastatin (CRESTOR) 20 MG tablet Take 10 mg by mouth daily.      . Tiotropium Bromide Monohydrate (SPIRIVA RESPIMAT) 1.25 MCG/ACT AERS Inhale 2 puffs into the lungs daily.    . zoledronic acid (RECLAST) 5 MG/100ML SOLN Inject 5 mg into the vein once. Every 2 years      No current facility-administered medications for this visit.     SURGICAL HISTORY:  Past Surgical History:  Procedure Laterality Date  . APPENDECTOMY    . COLONOSCOPY    . CYSTOSTOMY W/ BLADDER BIOPSY  2006  REVIEW OF SYSTEMS:   Review of Systems  Constitutional: Negative for appetite change, chills, fatigue, fever and unexpected weight change.  HENT:   Negative for mouth sores, nosebleeds, sore throat and trouble swallowing.   Eyes: Negative for eye problems and icterus.  Respiratory: Negative for hemoptysis, shortness of breath and wheezing.  Positive for nonproductive cough. Cardiovascular: Negative for chest pain and leg swelling.  Gastrointestinal: Negative for abdominal pain, constipation, diarrhea, nausea and vomiting.  Genitourinary: Negative for bladder incontinence, difficulty urinating, dysuria, frequency and hematuria.   Musculoskeletal: Negative for back pain, gait problem, neck pain and neck stiffness.  Skin: Negative for itching and rash.  Neurological: Negative for  dizziness, extremity weakness, gait problem, headaches, light-headedness and seizures.  Hematological: Negative for adenopathy. Does not bruise/bleed easily.  Psychiatric/Behavioral: Negative for confusion, depression and sleep disturbance. The patient is not nervous/anxious.     PHYSICAL EXAMINATION:  Blood pressure 140/82, pulse 98, temperature 98.3 F (36.8 C), temperature source Oral, resp. rate 18, height 5\' 10"  (1.778 m), weight 145 lb 1.6 oz (65.8 kg), SpO2 98 %.  ECOG PERFORMANCE STATUS: 1 - Symptomatic but completely ambulatory  Physical Exam  Constitutional: Oriented to person, place, and time and well-developed, well-nourished, and in no distress. No distress.  HENT:  Head: Normocephalic and atraumatic.  Mouth/Throat: Oropharynx is clear and moist. No oropharyngeal exudate.  Eyes: Conjunctivae are normal. Right eye exhibits no discharge. Left eye exhibits no discharge. No scleral icterus.  Neck: Normal range of motion. Neck supple.  Cardiovascular: Normal rate, regular rhythm, normal heart sounds and intact distal pulses.   Pulmonary/Chest: Effort normal and breath sounds normal. No respiratory distress. No wheezes. No rales.  Abdominal: Soft. Bowel sounds are normal. Exhibits no distension and no mass. There is no tenderness.  Musculoskeletal: Normal range of motion. Exhibits no edema.  Lymphadenopathy:    No cervical adenopathy.  Neurological: Alert and oriented to person, place, and time. Exhibits normal muscle tone. Gait normal. Coordination normal.  Skin: Skin is warm and dry. No rash noted. Not diaphoretic. No erythema. No pallor.  Psychiatric: Mood, memory and judgment normal.  Vitals reviewed.  LABORATORY DATA: Lab Results  Component Value Date   WBC 6.4 08/08/2017   HGB 13.7 08/08/2017   HCT 40.9 08/08/2017   MCV 121.4 (H) 08/08/2017   PLT 228 08/08/2017      Chemistry      Component Value Date/Time   NA 141 08/08/2017 1247   K 5.0 08/08/2017 1247   CL  117 (H) 05/01/2016 1240   CO2 25 08/08/2017 1247   BUN 14.7 08/08/2017 1247   CREATININE 1.2 08/08/2017 1247      Component Value Date/Time   CALCIUM 9.5 08/08/2017 1247   ALKPHOS 84 08/08/2017 1247   AST 21 08/08/2017 1247   ALT 17 08/08/2017 1247   BILITOT 0.69 08/08/2017 1247       RADIOGRAPHIC STUDIES:  Ct Chest Wo Contrast  Result Date: 07/28/2017 CLINICAL DATA:  Nodule follow-up left lower lobe.  Current smoker. EXAM: CT CHEST WITHOUT CONTRAST TECHNIQUE: Multidetector CT imaging of the chest was performed following the standard protocol without IV contrast. COMPARISON:  Chest CTs dated 01/25/2017 and 05/20/2014. FINDINGS: Cardiovascular: Heart size is normal. No pericardial effusion. Coronary artery calcifications noted. Aortic atherosclerosis. No aortic aneurysm. Mediastinum/Nodes: No mass or enlarged lymph nodes within the mediastinum or perihilar regions. Esophagus appears normal. Lungs/Pleura: The diffuse bilateral emphysematous change, moderate to severe in degree, is stable. The 7 mm pulmonary nodule  within the left upper lobe, lateral aspect, is stable (series 4, image 26). The irregular nodular density within the left upper lobe, posterior aspect, is stable compared to the previous exams suggesting chronic nodular scarring/fibrosis. The irregular nodular density within the left lower lobe, posterior aspect, has increased in size with a current measurement of 10 mm (measured 6 mm on the most recent prior study, new compared to the earlier exams) (series 4, image 76). No new pulmonary nodules.  No pleural effusion or pneumothorax. Upper Abdomen: No acute findings. Musculoskeletal: Mild degenerative spurring within the thoracic spine. No acute or suspicious osseous finding. IMPRESSION: 1. Enlarging nodule within the left lower lobe, posterior aspect, current measurement of 10 mm (measured 6 mm on the most recent chest CT of 01/25/2017, new compared to earlier exams). This is highly  suspicious for a neoplastic nodule. Recommend tissue sampling for definitive characterization. If further imaging characterization is desired for confirmation, consider PET-CT. 2. Stable pulmonary nodule within the left upper lobe. Additional irregular nodule within the left upper lobe is also stable compared to multiple prior studies suggesting chronic nodular scarring/fibrosis. No new pulmonary nodules. 3. Severe emphysematous change. 4. Coronary artery calcifications. Recommend correlation with any possible associated cardiac symptoms. 5. Aortic atherosclerosis. Emphysema (ICD10-J43.9). These results will be called to the ordering clinician or representative by the Radiologist Assistant, and communication documented in the PACS or zVision Dashboard. Electronically Signed   By: Franki Cabot M.D.   On: 07/28/2017 15:03   Nm Pet Image Initial (pi) Skull Base To Thigh  Result Date: 08/20/2017 CLINICAL DATA:  Initial treatment strategy for enlarging left lower lobe lung nodule and separate smaller left upper lobe nodule. EXAM: NUCLEAR MEDICINE PET SKULL BASE TO THIGH TECHNIQUE: 7.8 mCi F-18 FDG was injected intravenously. Full-ring PET imaging was performed from the skull base to thigh after the radiotracer. CT data was obtained and used for attenuation correction and anatomic localization. FASTING BLOOD GLUCOSE:  Value: 84 mg/dl COMPARISON:  Chest CT from 07/28/2017 FINDINGS: NECK No hypermetabolic lymph nodes in the neck. Common carotid atherosclerotic calcification. CHEST The left lower lobe nodule measures 1.0 cm in diameter on image 51/8 and has a maximum standard uptake value of 2.9. Other small pulmonary nodules including the left upper lobe nodule are not appreciably hypermetabolic catch that an indistinct other small nodules are not appreciably hypermetabolic. There is some mildly accentuated metabolic activity associated with the ground-glass opacity in the posterior basal segment right lower lobe which  is likely inflammatory given the morphology. Similarly some faint ground-glass opacity in the right upper lobe along the peribronchovascular region has subtle accentuation in activity which is probably inflammatory. No hypermetabolic adenopathy. Coronary, aortic arch, and branch vessel atherosclerotic vascular disease. Severe emphysema. Honeycombing in the lung bases. ABDOMEN/PELVIS No abnormal hypermetabolic activity within the liver, pancreas, adrenal glands, or spleen. No hypermetabolic lymph nodes in the abdomen or pelvis. Mildly accentuated activity eccentric to the left in the prostate gland, maximum SUV 5.8. Physiologic activity in bowel. Photopenic right kidney upper pole cyst. Dependent density in the gallbladder compatible with sludge or possibly stones. Small nonobstructive right kidney lower pole renal calculus. Aortoiliac atherosclerotic vascular disease. SKELETON No focal hypermetabolic activity to suggest skeletal metastasis. IMPRESSION: 1. The left lower lobe nodule is mildly hypermetabolic with maximum SUV 2.9, high suspicion for malignancy. No findings of nodal involvement or other significant hypermetabolic pulmonary nodules at this time. 2. Mildly accentuated activity eccentric to the left of the prostate gland. This is nonspecific,  but correlation with PSA level is recommended. 3. Other imaging findings of potential clinical significance: Aortic Atherosclerosis (ICD10-I70.0) and Emphysema (ICD10-J43.9). Nonobstructive right nephrolithiasis. Sludge or small stones in the gallbladder. Electronically Signed   By: Van Clines M.D.   On: 08/20/2017 13:50     ASSESSMENT/PLAN:  Lung nodule This is a pleasant 71 year old white male with a left lower lobe lung nodule measuring 10 mm diagnosed in October 2018. The patient has weight loss and cough.   The patient was seen with Dr. Julien Nordmann who had a lengthy discussion with the patient and his family members.  PET scan results were discussed  with the patient and his family and images were shown to them.  Explained to them that the left lung nodule remains highly suspicious for lung cancer.  Explained to the patient that he does not have any evidence of disease elsewhere.  We discussed proceeding with pulmonary function testing to see if he may be a candidate for surgical resection.  We will also proceed with a CT-guided core biopsy to confirm the diagnosis.  Once we have this information, we will see the patient back in approximately 2 weeks to discuss these results and determine if he is a surgical candidate or he should proceed with stereotactic radiation to this lung nodule.  The patient was again encouraged to quit smoking.  All questions were answered.  The patient was advised to call immediately if he has any concerning symptoms in the interval. Orders Placed This Encounter  Procedures  . CT BIOPSY    Standing Status:   Future    Standing Expiration Date:   11/22/2018    Order Specific Question:   Reason for exam:    Answer:   Bx of left lung nodule. Highly suspicious for lung cancer.    Order Specific Question:   Preferred imaging location?    Answer:   [facility]  . Pulmonary Function Test    Standing Status:   Future    Standing Expiration Date:   08/22/2018    Order Specific Question:   Where should this test be performed?    Answer:   [facility]    Order Specific Question:   Full PFT: includes the following: basic spirometry, spirometry pre & post bronchodilator, diffusion capacity (DLCO), lung volumes    Answer:   Full PFT   Mikey Bussing, DNP, AGPCNP-BC, AOCNP 08/23/17  ADDENDUM: Hematology/Oncology Attending: I had a face-to-face encounter with the patient.  I recommended his care plan.  This is a very pleasant 71 years old white male with suspicious left lower lobe pulmonary nodule. He had a recent PET scan that showed mild hypermetabolic activity in the pulmonary nodule. The patient has a long  history of his smoking and there are signs of emphysema on his recent CT imaging.  He may not be a good surgical candidate for resection of his tumor but this possibility is not completely excluded. We will arrange for the patient to have pulmonary function test as well as CT-guided core biopsy of the left lower lobe pulmonary nodule. If the patient is not a good surgical candidate for resection, he would be referred to radiation oncology for consideration of stereotactic body radiotherapy to this nodule after the tissue diagnosis. We will arrange for the patient to come back for follow-up visit in 2 weeks for reevaluation and more detailed discussion of his treatment options based on the biopsy results. The patient was advised to call immediately if he  has any concerning symptoms in the interval. Disclaimer: This note was dictated with voice recognition software. Similar sounding words can inadvertently be transcribed and may be missed upon review. Eilleen Kempf, MD 08/23/17

## 2017-08-23 ENCOUNTER — Encounter: Payer: Self-pay | Admitting: Oncology

## 2017-08-23 NOTE — Assessment & Plan Note (Signed)
This is a pleasant 71 year old white male with a left lower lobe lung nodule measuring 10 mm diagnosed in October 2018. The patient has weight loss and cough.   The patient was seen with Dr. Julien Nordmann who had a lengthy discussion with the patient and his family members.  PET scan results were discussed with the patient and his family and images were shown to them.  Explained to them that the left lung nodule remains highly suspicious for lung cancer.  Explained to the patient that he does not have any evidence of disease elsewhere.  We discussed proceeding with pulmonary function testing to see if he may be a candidate for surgical resection.  We will also proceed with a CT-guided core biopsy to confirm the diagnosis.  Once we have this information, we will see the patient back in approximately 2 weeks to discuss these results and determine if he is a surgical candidate or he should proceed with stereotactic radiation to this lung nodule.  The patient was again encouraged to quit smoking.

## 2017-08-30 ENCOUNTER — Other Ambulatory Visit: Payer: Self-pay | Admitting: General Surgery

## 2017-08-31 ENCOUNTER — Other Ambulatory Visit: Payer: Self-pay | Admitting: Radiology

## 2017-08-31 ENCOUNTER — Ambulatory Visit (HOSPITAL_COMMUNITY)
Admission: RE | Admit: 2017-08-31 | Discharge: 2017-08-31 | Disposition: A | Discharge status: Home / Self Care | Payer: Medicare Other | Source: Ambulatory Visit | Attending: Oncology | Admitting: Oncology

## 2017-08-31 DIAGNOSIS — F329 Major depressive disorder, single episode, unspecified: Secondary | ICD-10-CM | POA: Diagnosis not present

## 2017-08-31 DIAGNOSIS — Z79899 Other long term (current) drug therapy: Secondary | ICD-10-CM | POA: Insufficient documentation

## 2017-08-31 DIAGNOSIS — Z825 Family history of asthma and other chronic lower respiratory diseases: Secondary | ICD-10-CM | POA: Insufficient documentation

## 2017-08-31 DIAGNOSIS — Z801 Family history of malignant neoplasm of trachea, bronchus and lung: Secondary | ICD-10-CM | POA: Insufficient documentation

## 2017-08-31 DIAGNOSIS — M109 Gout, unspecified: Secondary | ICD-10-CM | POA: Insufficient documentation

## 2017-08-31 DIAGNOSIS — I700 Atherosclerosis of aorta: Secondary | ICD-10-CM | POA: Diagnosis not present

## 2017-08-31 DIAGNOSIS — F410 Panic disorder [episodic paroxysmal anxiety] without agoraphobia: Secondary | ICD-10-CM | POA: Insufficient documentation

## 2017-08-31 DIAGNOSIS — Z9889 Other specified postprocedural states: Secondary | ICD-10-CM | POA: Insufficient documentation

## 2017-08-31 DIAGNOSIS — K8020 Calculus of gallbladder without cholecystitis without obstruction: Secondary | ICD-10-CM | POA: Insufficient documentation

## 2017-08-31 DIAGNOSIS — Z9350 Unspecified cystostomy status: Secondary | ICD-10-CM | POA: Insufficient documentation

## 2017-08-31 DIAGNOSIS — R911 Solitary pulmonary nodule: Secondary | ICD-10-CM | POA: Diagnosis not present

## 2017-08-31 DIAGNOSIS — F17210 Nicotine dependence, cigarettes, uncomplicated: Secondary | ICD-10-CM | POA: Insufficient documentation

## 2017-08-31 DIAGNOSIS — I10 Essential (primary) hypertension: Secondary | ICD-10-CM | POA: Diagnosis not present

## 2017-08-31 DIAGNOSIS — M069 Rheumatoid arthritis, unspecified: Secondary | ICD-10-CM | POA: Diagnosis not present

## 2017-08-31 DIAGNOSIS — E785 Hyperlipidemia, unspecified: Secondary | ICD-10-CM | POA: Diagnosis not present

## 2017-08-31 DIAGNOSIS — I2510 Atherosclerotic heart disease of native coronary artery without angina pectoris: Secondary | ICD-10-CM | POA: Diagnosis not present

## 2017-08-31 DIAGNOSIS — Z79891 Long term (current) use of opiate analgesic: Secondary | ICD-10-CM | POA: Insufficient documentation

## 2017-08-31 DIAGNOSIS — N200 Calculus of kidney: Secondary | ICD-10-CM | POA: Diagnosis not present

## 2017-08-31 DIAGNOSIS — Z7901 Long term (current) use of anticoagulants: Secondary | ICD-10-CM | POA: Diagnosis not present

## 2017-08-31 DIAGNOSIS — Z888 Allergy status to other drugs, medicaments and biological substances status: Secondary | ICD-10-CM | POA: Diagnosis not present

## 2017-08-31 DIAGNOSIS — Z87442 Personal history of urinary calculi: Secondary | ICD-10-CM | POA: Insufficient documentation

## 2017-08-31 DIAGNOSIS — J449 Chronic obstructive pulmonary disease, unspecified: Secondary | ICD-10-CM | POA: Diagnosis not present

## 2017-08-31 DIAGNOSIS — Z886 Allergy status to analgesic agent status: Secondary | ICD-10-CM | POA: Insufficient documentation

## 2017-08-31 DIAGNOSIS — Z803 Family history of malignant neoplasm of breast: Secondary | ICD-10-CM | POA: Insufficient documentation

## 2017-08-31 DIAGNOSIS — G5600 Carpal tunnel syndrome, unspecified upper limb: Secondary | ICD-10-CM | POA: Insufficient documentation

## 2017-08-31 DIAGNOSIS — Z800 Family history of malignant neoplasm of digestive organs: Secondary | ICD-10-CM | POA: Insufficient documentation

## 2017-08-31 LAB — PULMONARY FUNCTION TEST
DL/VA % PRED: 53 %
DL/VA: 2.47 ml/min/mmHg/L
DLCO UNC: 11.13 ml/min/mmHg
DLCO unc % pred: 34 %
FEF 25-75 Post: 0.82 L/sec
FEF 25-75 Pre: 0.92 L/sec
FEF2575-%Change-Post: -10 %
FEF2575-%Pred-Post: 33 %
FEF2575-%Pred-Pre: 37 %
FEV1-%CHANGE-POST: -1 %
FEV1-%Pred-Post: 49 %
FEV1-%Pred-Pre: 50 %
FEV1-PRE: 1.63 L
FEV1-Post: 1.61 L
FEV1FVC-%CHANGE-POST: -3 %
FEV1FVC-%Pred-Pre: 88 %
FEV6-%Change-Post: -1 %
FEV6-%Pred-Post: 59 %
FEV6-%Pred-Pre: 59 %
FEV6-PRE: 2.5 L
FEV6-Post: 2.47 L
FEV6FVC-%Change-Post: -3 %
FEV6FVC-%PRED-PRE: 106 %
FEV6FVC-%Pred-Post: 102 %
FVC-%Change-Post: 2 %
FVC-%Pred-Post: 57 %
FVC-%Pred-Pre: 56 %
FVC-POST: 2.56 L
FVC-Pre: 2.51 L
POST FEV1/FVC RATIO: 63 %
Post FEV6/FVC ratio: 97 %
Pre FEV1/FVC ratio: 65 %
Pre FEV6/FVC Ratio: 100 %
RV % pred: 113 %
RV: 2.8 L
TLC % PRED: 81 %
TLC: 5.73 L

## 2017-08-31 MED ORDER — ALBUTEROL SULFATE (2.5 MG/3ML) 0.083% IN NEBU
2.5000 mg | INHALATION_SOLUTION | Freq: Once | RESPIRATORY_TRACT | Status: AC
  Administered 2017-08-31: 2.5 mg via RESPIRATORY_TRACT

## 2017-09-01 ENCOUNTER — Ambulatory Visit (HOSPITAL_COMMUNITY)
Admission: RE | Admit: 2017-09-01 | Discharge: 2017-09-01 | Disposition: A | Discharge status: Home / Self Care | Payer: Medicare Other | Source: Ambulatory Visit | Attending: Oncology | Admitting: Oncology

## 2017-09-01 ENCOUNTER — Ambulatory Visit (HOSPITAL_COMMUNITY)
Admission: RE | Admit: 2017-09-01 | Discharge: 2017-09-01 | Disposition: A | Discharge status: Home / Self Care | Payer: Medicare Other | Source: Ambulatory Visit | Attending: Diagnostic Radiology | Admitting: Diagnostic Radiology

## 2017-09-01 DIAGNOSIS — Z9889 Other specified postprocedural states: Secondary | ICD-10-CM | POA: Diagnosis not present

## 2017-09-01 DIAGNOSIS — R911 Solitary pulmonary nodule: Secondary | ICD-10-CM | POA: Diagnosis not present

## 2017-09-01 DIAGNOSIS — R918 Other nonspecific abnormal finding of lung field: Secondary | ICD-10-CM | POA: Diagnosis not present

## 2017-09-01 DIAGNOSIS — C3432 Malignant neoplasm of lower lobe, left bronchus or lung: Secondary | ICD-10-CM | POA: Diagnosis not present

## 2017-09-01 LAB — CBC
HEMATOCRIT: 43.6 % (ref 39.0–52.0)
Hemoglobin: 15 g/dL (ref 13.0–17.0)
MCH: 39.9 pg — ABNORMAL HIGH (ref 26.0–34.0)
MCHC: 34.4 g/dL (ref 30.0–36.0)
MCV: 116 fL — AB (ref 78.0–100.0)
Platelets: 146 10*3/uL — ABNORMAL LOW (ref 150–400)
RBC: 3.76 MIL/uL — ABNORMAL LOW (ref 4.22–5.81)
RDW: 13.6 % (ref 11.5–15.5)
WBC: 9.6 10*3/uL (ref 4.0–10.5)

## 2017-09-01 LAB — PROTIME-INR
INR: 1
Prothrombin Time: 13.1 seconds (ref 11.4–15.2)

## 2017-09-01 MED ORDER — SODIUM CHLORIDE 0.9 % IV SOLN: INTRAVENOUS | Status: DC

## 2017-09-01 MED ORDER — FENTANYL CITRATE (PF) 100 MCG/2ML IJ SOLN
INTRAMUSCULAR | Status: AC | PRN
  Administered 2017-09-01: 50 ug via INTRAVENOUS

## 2017-09-01 MED ORDER — MIDAZOLAM HCL 2 MG/2ML IJ SOLN
INTRAMUSCULAR | Status: AC
  Filled 2017-09-01: qty 2

## 2017-09-01 MED ORDER — LIDOCAINE HCL 1 % IJ SOLN
INTRAMUSCULAR | Status: AC
  Filled 2017-09-01: qty 20

## 2017-09-01 MED ORDER — FENTANYL CITRATE (PF) 100 MCG/2ML IJ SOLN
INTRAMUSCULAR | Status: AC
  Filled 2017-09-01: qty 2

## 2017-09-01 MED ORDER — MIDAZOLAM HCL 2 MG/2ML IJ SOLN
INTRAMUSCULAR | Status: AC | PRN
  Administered 2017-09-01: 1 mg via INTRAVENOUS

## 2017-09-01 NOTE — Sedation Documentation (Signed)
Patient is resting comfortably. 

## 2017-09-01 NOTE — H&P (Signed)
Chief Complaint: Patient was seen in consultation today for lung nodule  Referring Physician(s): Mikey Bussing, NP,  Dr. Julien Nordmann  Supervising Physician: Markus Daft  Patient Status: [facility] - Out-pt  History of Present Illness: Kenneth [name] is a 71 y.o. male with past medical history of anxiety, COPD, tobacco use who presented to PCP with non-productive cough.  CT Chest 07/28/17 showed: 1. Enlarging nodule within the left lower lobe, posterior aspect, current measurement of 10 mm (measured 6 mm on the most recent chest CT of 01/25/2017, new compared to earlier exams). This is highly suspicious for a neoplastic nodule. Recommend tissue sampling for definitive characterization. If further imaging characterization is desired for confirmation, consider PET-CT. 2. Stable pulmonary nodule within the left upper lobe. Additional irregular nodule within the left upper lobe is also stable compared to multiple prior studies suggesting chronic nodular scarring/fibrosis. No new pulmonary nodules. 3. Severe emphysematous change. 4. Coronary artery calcifications. Recommend correlation with any possible associated cardiac symptoms. 5. Aortic atherosclerosis.  PET 08/20/17: 1. The left lower lobe nodule is mildly hypermetabolic with maximum SUV 2.9, high suspicion for malignancy. No findings of nodal involvement or other significant hypermetabolic pulmonary nodules at this time. 2. Mildly accentuated activity eccentric to the left of the prostate gland. This is nonspecific, but correlation with PSA level is recommended. 3. Other imaging findings of potential clinical significance: Aortic Atherosclerosis (ICD10-I70.0) and Emphysema (ICD10-J43.9). Nonobstructive right nephrolithiasis. Sludge or small stones in the gallbladder.  IR consulted for lung nodule biospy at the request of Myrtha Mantis.  He has been NPO. He has held Elliquis since 11/12  Past Medical History:  Diagnosis Date  . Abnormal  LFTs   . Anxiety   . COPD (chronic obstructive pulmonary disease) (HCC)    emphysema.  . CTS (carpal tunnel syndrome)    Left  . DDD (degenerative disc disease)   . Depression    mild  . Elevated MCV   . Emphysema of lung ([place])   . Gout   . Gynecomastia   . Hearing loss   . Hyperlipidemia   . Microhematuria   . Nephrolithiasis   . Ocular migraine   . Panic attack   . Rheumatoid arthritis(714.0) dx 2000  . Situational hypertension   . Tobacco abuse     Past Surgical History:  Procedure Laterality Date  . APPENDECTOMY    . COLONOSCOPY    . CYSTOSTOMY W/ BLADDER BIOPSY  2006  . ESOPHAGOGASTRODUODENOSCOPY N/A 04/30/2016   Procedure: ESOPHAGOGASTRODUODENOSCOPY (EGD);  Surgeon: Doran Stabler, MD;  Location: [facility];  Service: Endoscopy;  Laterality: N/A;    Allergies: Other; Aspirin; and Naproxen sodium  Medications: Prior to Admission medications   Medication Sig Start Date End Date Taking? Authorizing Provider  acetaminophen (TYLENOL) 325 MG tablet Take 325 mg by mouth every 4 (four) hours as needed for mild pain.    Yes [provider]  amLODipine (NORVASC) 5 MG tablet Take 1 tablet daily by mouth. 08/06/17  Yes [provider]  apixaban (ELIQUIS) 2.5 MG TABS tablet Take 2.5 mg 2 (two) times daily by mouth.   Yes [provider]  budesonide-formoterol (SYMBICORT) 80-4.5 MCG/ACT inhaler Inhale 2 puffs into the lungs 2 (two) times daily.   Yes [provider]  cholecalciferol (VITAMIN D) 1000 UNITS tablet Take 1,000 Units by mouth daily.     Yes [provider]  clonazePAM (KLONOPIN) 0.5 MG tablet Take 0.5 mg at bedtime by mouth.  Yes [provider]  Dextromethorphan-Guaifenesin (TUSSIN DM PO) Take 1 tablet by mouth every 6 (six) hours as needed (cough and cold).    Yes [provider]  fluticasone (FLONASE) 50 MCG/ACT nasal spray Place 2 sprays into the nose daily.     Yes [provider]    folic acid (FOLVITE) 1 MG tablet Take 1 mg by mouth daily.     Yes [provider]  HYDROcodone-acetaminophen (NORCO/VICODIN) 5-325 MG tablet Take 1 tablet every 6 (six) hours as needed by mouth for moderate pain.   Yes [provider]  methotrexate (RHEUMATREX) 2.5 MG tablet Take 7.5 mg by mouth 2 (two) times a week. Take 3 tabs every Friday 3 tabs on Sunday   Yes [provider]  pantoprazole (PROTONIX) 40 MG tablet Take 1 tablet (40 mg total) by mouth daily. 05/01/16  Yes Hollice Gong, Mir Mohammed, MD  rosuvastatin (CRESTOR) 20 MG tablet Take 10 mg every evening by mouth.    Yes [provider]  Tiotropium Bromide Monohydrate (SPIRIVA RESPIMAT) 1.25 MCG/ACT AERS Inhale 2 puffs into the lungs daily.   Yes [provider]  valACYclovir (VALTREX) 500 MG tablet Take 500 mg 2 (two) times daily by mouth.   Yes [provider]  zoledronic acid (RECLAST) 5 MG/100ML SOLN Inject 5 mg into the vein once. Every 2 years    Yes [provider]  nystatin (MYCOSTATIN) 100000 UNIT/ML suspension Take 5 mLs (500,000 Units total) by mouth 4 (four) times daily. Patient taking differently: Take 5 mLs 4 (four) times daily as needed by mouth (Mouth irritations).  05/01/16   Tomma Rakers, MD     Family History  Problem Relation Age of Onset  . Lung cancer Father   . Asthma Paternal Grandmother   . Lung cancer Paternal Aunt   . Breast cancer Maternal Aunt   . Breast cancer Maternal Aunt   . Stomach cancer Maternal Uncle   . Colon cancer Neg Hx     Social History   Socioeconomic History  . Marital status: Married    Spouse name: Mary  . Number of children: 1  . Years of education: Not on file  . Highest education level: Not on file  Social Needs  . Financial resource strain: Not on file  . Food insecurity - worry: Not on file  . Food insecurity - inability: Not on file  . Transportation needs - medical: Not on file  .  Transportation needs - non-medical: Not on file  Occupational History  . Occupation: Civil engineer, contracting: UNEMPLOYED  Tobacco Use  . Smoking status: Current Every Day Smoker    Packs/day: 0.50    Years: 44.00    Pack years: 22.00    Types: Cigarettes  . Smokeless tobacco: Never Used  Substance and Sexual Activity  . Alcohol use: No  . Drug use: No  . Sexual activity: Not on file  Other Topics Concern  . Not on file  Social History Narrative   Married since 1974   2 years at [place]  Constitutional: Negative for fatigue and fever.  Respiratory: Positive for cough (chronic). Negative for shortness of breath.   Cardiovascular: Negative for chest pain.  Gastrointestinal: Negative for abdominal pain.  Psychiatric/Behavioral: Negative for behavioral problems and confusion.    Vital Signs: BP (!) 144/72 (BP Location: Right Arm)   Pulse 72   Temp 97.9 F (36.6 C) (Oral)  Ht 5\' 10"  (1.778 m)   Wt 146 lb (66.2 kg)   SpO2 96%   BMI 20.95 kg/m   Physical Exam  Constitutional: He is oriented to person, place, and time. He appears well-developed.  Cardiovascular: Normal rate, regular rhythm and normal heart sounds.  Pulmonary/Chest: Effort normal and breath sounds normal. No respiratory distress.  Abdominal: Soft.  Neurological: He is alert and oriented to person, place, and time.  Skin: Skin is warm and dry.  Psychiatric: He has a normal mood and affect. His behavior is normal. Judgment and thought content normal.  Nursing note and vitals reviewed.   Imaging: Nm Pet Image Initial (pi) Skull Base To Thigh  Result Date: 08/20/2017 CLINICAL DATA:  Initial treatment strategy for enlarging left lower lobe lung nodule and separate smaller left upper lobe nodule. EXAM: NUCLEAR MEDICINE PET SKULL BASE TO THIGH TECHNIQUE: 7.8 mCi F-18 FDG was injected intravenously. Full-ring PET imaging was performed from the skull base to thigh after the radiotracer.  CT data was obtained and used for attenuation correction and anatomic localization. FASTING BLOOD GLUCOSE:  Value: 84 mg/dl COMPARISON:  Chest CT from 07/28/2017 FINDINGS: NECK No hypermetabolic lymph nodes in the neck. Common carotid atherosclerotic calcification. CHEST The left lower lobe nodule measures 1.0 cm in diameter on image 51/8 and has a maximum standard uptake value of 2.9. Other small pulmonary nodules including the left upper lobe nodule are not appreciably hypermetabolic catch that an indistinct other small nodules are not appreciably hypermetabolic. There is some mildly accentuated metabolic activity associated with the ground-glass opacity in the posterior basal segment right lower lobe which is likely inflammatory given the morphology. Similarly some faint ground-glass opacity in the right upper lobe along the peribronchovascular region has subtle accentuation in activity which is probably inflammatory. No hypermetabolic adenopathy. Coronary, aortic arch, and branch vessel atherosclerotic vascular disease. Severe emphysema. Honeycombing in the lung bases. ABDOMEN/PELVIS No abnormal hypermetabolic activity within the liver, pancreas, adrenal glands, or spleen. No hypermetabolic lymph nodes in the abdomen or pelvis. Mildly accentuated activity eccentric to the left in the prostate gland, maximum SUV 5.8. Physiologic activity in bowel. Photopenic right kidney upper pole cyst. Dependent density in the gallbladder compatible with sludge or possibly stones. Small nonobstructive right kidney lower pole renal calculus. Aortoiliac atherosclerotic vascular disease. SKELETON No focal hypermetabolic activity to suggest skeletal metastasis. IMPRESSION: 1. The left lower lobe nodule is mildly hypermetabolic with maximum SUV 2.9, high suspicion for malignancy. No findings of nodal involvement or other significant hypermetabolic pulmonary nodules at this time. 2. Mildly accentuated activity eccentric to the left  of the prostate gland. This is nonspecific, but correlation with PSA level is recommended. 3. Other imaging findings of potential clinical significance: Aortic Atherosclerosis (ICD10-I70.0) and Emphysema (ICD10-J43.9). Nonobstructive right nephrolithiasis. Sludge or small stones in the gallbladder. Electronically Signed   By: Van Clines M.D.   On: 08/20/2017 13:50    Labs:  CBC: Recent Labs    08/08/17 1247 09/01/17 0942  WBC 6.4 9.6  HGB 13.7 15.0  HCT 40.9 43.6  PLT 228 146*    COAGS: Recent Labs    09/01/17 0942  INR 1.00    BMP: Recent Labs    08/08/17 1247  NA 141  K 5.0  CO2 25  GLUCOSE 103  BUN 14.7  CALCIUM 9.5  CREATININE 1.2    LIVER FUNCTION TESTS: Recent Labs    08/08/17 1247  BILITOT 0.69  AST 21  ALT 17  ALKPHOS 84  PROT 6.9  ALBUMIN 3.0*    TUMOR MARKERS: No results for input(s): AFPTM, CEA, CA199, CHROMGRNA in the last 8760 hours.  Assessment and Plan: Patient with past medical history of tobacco use, COPD presents with complaint of lung nodule.  IR consulted for lung nodule biopsy at the request of Mikey Bussing, NP. Case reviewed by Dr. Vernard Gambles who approves patient for procedure.  Patient presents today in their usual state of health.  He has been NPO and is not currently on blood thinners.  Risks and benefits discussed with the patient including, but not limited to bleeding, hemoptysis, respiratory failure requiring intubation, infection, pneumothorax requiring chest tube placement, stroke from air embolism or even death. All of the patient's questions were answered, patient is agreeable to proceed. Consent signed and in chart.  Thank you for this interesting consult.  I greatly enjoyed meeting ANGELA PLATNER and look forward to participating in their care.  A copy of this report was sent to the requesting provider on this date.  Electronically Signed: Docia Barrier, PA 09/01/2017, 10:43 AM   I spent a total of   30 Minutes   in face to face in clinical consultation, greater than 50% of which was counseling/coordinating care for lung nodule.

## 2017-09-01 NOTE — Discharge Instructions (Signed)
**Note -identified via Obfuscation** Lung Biopsy, Care After °This sheet gives you information about how to care for yourself after your procedure. Your health care provider may also give you more specific instructions depending on the type of biopsy you had. If you have problems or questions, contact your health care provider. °What can I expect after the procedure? °After the procedure, it is common to have: °· A cough. °· A sore throat. °· Pain where a needle, bronchoscope, or incision was used to collect a biopsy sample (biopsy site). ° °Follow these instructions at home: °Medicines °· Take over-the-counter and prescription medicines only as told by your health care provider. °· Do not drive for 24 hours if you were given a sedative. °· Do not drink alcohol while taking pain medicine. °· Do not drive or use heavy machinery while taking prescription pain medicine. °· To prevent or treat constipation while you are taking prescription pain medicine, your health care provider may recommend that you: °? Drink enough fluid to keep your urine clear or pale yellow. °? Take over-the-counter or prescription medicines. °? Eat foods that are high in fiber, such as fresh fruits and vegetables, whole grains, and beans. °? Limit foods that are high in fat and processed sugars, such as fried and sweet foods. °Activity °· If you had an incision during your procedure, avoid activities that may pull the incision site open. °· Return to your normal activities as told by your health care provider. Ask your health care provider what activities are safe for you. °If you had an open biopsy:  °· Follow instructions from your health care provider about how to take care of your incision. Make sure you: °? Wash your hands with soap and water before you change your bandage (dressing). If soap and water are not available, use hand sanitizer. °? Change your dressing as told by your health care provider. °? Leave stitches (sutures), skin glue, or adhesive strips in place. These  skin closures may need to stay in place for 2 weeks or longer. If adhesive strip edges start to loosen and curl up, you may trim the loose edges. Do not remove adhesive strips completely unless your health care provider tells you to do that. °· Check your incision area every day for signs of infection. Check for: °? Redness, swelling, or pain. °? Fluid or blood. °? Warmth. °? Pus or a bad smell. °General instructions °· It is up to you to get the results of your procedure. Ask your health care provider, or the department that is doing the procedure, when your results will be ready. °Contact a health care provider if: °· You have a fever. °· You have redness, swelling, or pain around your biopsy site. °· You have fluid or blood coming from your biopsy site. °· Your biopsy site feels warm to the touch. °· You have pus or a bad smell coming from your biopsy site. °Get help right away if: °· You cough up blood. °· You have trouble breathing. °· You have chest pain. °Summary °· After the procedure, it is common to have a sore throat and a cough. °· Return to your normal activities as told by your health care provider. Ask your health care provider what activities are safe for you. °· Take over-the-counter and prescription medicines only as told by your health care provider. °· Report any unusual symptoms to your health care provider. °This information is not intended to replace advice given to you by your health care provider. Make sure  **Note -identified via Obfuscation** you discuss any questions you have with your health care provider. °Document Released: 11/02/2016 Document Revised: 11/02/2016 Document Reviewed: 11/02/2016 °Elsevier Interactive Patient Education © 2018 [employer] ° °

## 2017-09-01 NOTE — Procedures (Signed)
CT guided core biopsy of left lower lobe lung nodule.  Small amount of blood loss and no pneumothorax.  No immediate complication.  CXR in one hour.

## 2017-09-01 NOTE — Progress Notes (Signed)
**Note -Identified via Obfuscation** This RN instructed by Dr. Anselm Pancoast to inform patient that he may restart Eliquis Saturday, 09/03/2017 as long as he is not coughing up blood. Patient and patient's family verbalized understanding and information provided in writing to patient.

## 2017-09-05 ENCOUNTER — Encounter: Payer: Self-pay | Admitting: Oncology

## 2017-09-05 ENCOUNTER — Ambulatory Visit (HOSPITAL_BASED_OUTPATIENT_CLINIC_OR_DEPARTMENT_OTHER): Payer: Medicare Other | Admitting: Oncology

## 2017-09-05 VITALS — BP 157/80 | HR 86 | Temp 97.7°F | Resp 18 | Ht 70.0 in | Wt 145.0 lb

## 2017-09-05 DIAGNOSIS — C3432 Malignant neoplasm of lower lobe, left bronchus or lung: Secondary | ICD-10-CM | POA: Diagnosis not present

## 2017-09-05 DIAGNOSIS — C3492 Malignant neoplasm of unspecified part of left bronchus or lung: Secondary | ICD-10-CM

## 2017-09-05 DIAGNOSIS — R911 Solitary pulmonary nodule: Secondary | ICD-10-CM

## 2017-09-05 DIAGNOSIS — R05 Cough: Secondary | ICD-10-CM

## 2017-09-05 NOTE — Progress Notes (Signed)
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  PRIOR THERAPY: None  CURRENT THERAPY: None  INTERVAL HISTORY: Kenneth [name] 71 y.o. male returns for routine follow-up visit with his sister-in-law.  The patient is feeling fine today with the exception of an ongoing nonproductive cough.  The patient denies fevers and chills.  Denies chest pain, shortness of breath, hemoptysis.  Denies nausea, vomiting, constipation, diarrhea.  The patient had a recent biopsy of his left lower lobe lung nodule and is here to discuss the results.  MEDICAL HISTORY: Past Medical History:  Diagnosis Date  . Abnormal LFTs   . Anxiety   . COPD (chronic obstructive pulmonary disease) (HCC)    emphysema.  . CTS (carpal tunnel syndrome)    Left  . DDD (degenerative disc disease)   . Depression    mild  . Elevated MCV   . Emphysema of lung ([facility])   . Gout   . Gynecomastia   . Hearing loss   . Hyperlipidemia   . Microhematuria   . Nephrolithiasis   . Ocular migraine   . Panic attack   . Rheumatoid arthritis(714.0) dx 2000  . Situational hypertension   . Tobacco abuse     ALLERGIES:  is allergic to other; aspirin; and naproxen sodium.  MEDICATIONS:  Current Outpatient Medications  Medication Sig Dispense Refill  . acetaminophen (TYLENOL) 325 MG tablet Take 325 mg by mouth every 4 (four) hours as needed for mild pain.     Marland Kitchen amLODipine (NORVASC) 5 MG tablet Take 1 tablet daily by mouth.  3  . apixaban (ELIQUIS) 2.5 MG TABS tablet Take 2.5 mg 2 (two) times daily by mouth.    . budesonide-formoterol (SYMBICORT) 80-4.5 MCG/ACT inhaler Inhale 2 puffs into the lungs 2 (two) times daily.    . cholecalciferol (VITAMIN D) 1000 UNITS tablet Take 1,000 Units by mouth daily.      . clonazePAM (KLONOPIN) 0.5 MG tablet Take 0.5 mg at bedtime by  mouth.     . Dextromethorphan-Guaifenesin (TUSSIN DM PO) Take 1 tablet by mouth every 6 (six) hours as needed (cough and cold).     . fluticasone (FLONASE) 50 MCG/ACT nasal spray Place 2 sprays into the nose daily.      . folic acid (FOLVITE) 1 MG tablet Take 1 mg by mouth daily.      Marland Kitchen HYDROcodone-acetaminophen (NORCO/VICODIN) 5-325 MG tablet Take 1 tablet every 6 (six) hours as needed by mouth for moderate pain.    . methotrexate (RHEUMATREX) 2.5 MG tablet Take 7.5 mg by mouth 2 (two) times a week. Take 3 tabs every Friday 3 tabs on Sunday    . nystatin (MYCOSTATIN) 100000 UNIT/ML suspension Take 5 mLs (500,000 Units total) by mouth 4 (four) times daily. (Patient taking differently: Take 5 mLs 4 (four) times daily as needed by mouth (Mouth irritations). ) 160 mL 0  . pantoprazole (PROTONIX) 40 MG tablet Take 1 tablet (40 mg total) by mouth daily. 30 tablet 0  . rosuvastatin (CRESTOR) 20 MG tablet Take 10 mg every evening by mouth.     . Tiotropium Bromide Monohydrate (SPIRIVA RESPIMAT) 1.25 MCG/ACT AERS Inhale 2 puffs into the lungs daily.    . valACYclovir (VALTREX) 500 MG tablet Take 500 mg 2 (two) times daily by mouth.    . zoledronic acid (RECLAST) 5 MG/100ML  SOLN Inject 5 mg into the vein once. Every 2 years      No current facility-administered medications for this visit.     SURGICAL HISTORY:  Past Surgical History:  Procedure Laterality Date  . APPENDECTOMY    . COLONOSCOPY    . CYSTOSTOMY W/ BLADDER BIOPSY  2006  . ESOPHAGOGASTRODUODENOSCOPY (EGD) N/A 04/30/2016   Performed by Doran Stabler, MD at [facility]:   Review of Systems  Constitutional: Negative for appetite change, chills, fatigue, fever and unexpected weight change.  HENT:   Negative for mouth sores, nosebleeds, sore throat and trouble swallowing.   Eyes: Negative for eye problems and icterus.  Respiratory: Negative for hemoptysis, shortness of breath and wheezing.  Positive for  nonproductive cough. Cardiovascular: Negative for chest pain and leg swelling.  Gastrointestinal: Negative for abdominal pain, constipation, diarrhea, nausea and vomiting.  Genitourinary: Negative for bladder incontinence, difficulty urinating, dysuria, frequency and hematuria.   Musculoskeletal: Negative for back pain, gait problem, neck pain and neck stiffness.  Skin: Negative for itching and rash.  Neurological: Negative for dizziness, extremity weakness, gait problem, headaches, light-headedness and seizures.  Hematological: Negative for adenopathy. Does not bruise/bleed easily.  Psychiatric/Behavioral: Negative for confusion, depression and sleep disturbance. The patient is not nervous/anxious.     PHYSICAL EXAMINATION:  Blood pressure (!) 157/80, pulse 86, temperature 97.7 F (36.5 C), temperature source Oral, resp. rate 18, height 5\' 10"  (1.778 m), weight 145 lb (65.8 kg), SpO2 100 %.  ECOG PERFORMANCE STATUS: 1 - Symptomatic but completely ambulatory  Physical Exam  Constitutional: Oriented to person, place, and time and well-developed, well-nourished, and in no distress. No distress.  HENT:  Head: Normocephalic and atraumatic.  Mouth/Throat: Oropharynx is clear and moist. No oropharyngeal exudate.  Eyes: Conjunctivae are normal. Right eye exhibits no discharge. Left eye exhibits no discharge. No scleral icterus.  Neck: Normal range of motion. Neck supple.  Cardiovascular: Normal rate, regular rhythm, normal heart sounds and intact distal pulses.   Pulmonary/Chest: Effort normal and breath sounds normal. No respiratory distress. No wheezes. No rales.  Abdominal: Soft. Bowel sounds are normal. Exhibits no distension and no mass. There is no tenderness.  Musculoskeletal: Normal range of motion. Exhibits no edema.  Lymphadenopathy:    No cervical adenopathy.  Neurological: Alert and oriented to person, place, and time. Exhibits normal muscle tone. Gait normal. Coordination normal.   Skin: Skin is warm and dry. No rash noted. Not diaphoretic. No erythema. No pallor.  Psychiatric: Mood, memory and judgment normal.  Vitals reviewed.  LABORATORY DATA: Lab Results  Component Value Date   WBC 9.6 09/01/2017   HGB 15.0 09/01/2017   HCT 43.6 09/01/2017   MCV 116.0 (H) 09/01/2017   PLT 146 (L) 09/01/2017      Chemistry      Component Value Date/Time   NA 141 08/08/2017 1247   K 5.0 08/08/2017 1247   CL 117 (H) 05/01/2016 1240   CO2 25 08/08/2017 1247   BUN 14.7 08/08/2017 1247   CREATININE 1.2 08/08/2017 1247      Component Value Date/Time   CALCIUM 9.5 08/08/2017 1247   ALKPHOS 84 08/08/2017 1247   AST 21 08/08/2017 1247   ALT 17 08/08/2017 1247   BILITOT 0.69 08/08/2017 1247       RADIOGRAPHIC STUDIES:  Nm Pet Image Initial (pi) Skull Base To Thigh  Result Date: 08/20/2017 CLINICAL DATA:  Initial treatment strategy for enlarging left lower lobe  lung nodule and separate smaller left upper lobe nodule. EXAM: NUCLEAR MEDICINE PET SKULL BASE TO THIGH TECHNIQUE: 7.8 mCi F-18 FDG was injected intravenously. Full-ring PET imaging was performed from the skull base to thigh after the radiotracer. CT data was obtained and used for attenuation correction and anatomic localization. FASTING BLOOD GLUCOSE:  Value: 84 mg/dl COMPARISON:  Chest CT from 07/28/2017 FINDINGS: NECK No hypermetabolic lymph nodes in the neck. Common carotid atherosclerotic calcification. CHEST The left lower lobe nodule measures 1.0 cm in diameter on image 51/8 and has a maximum standard uptake value of 2.9. Other small pulmonary nodules including the left upper lobe nodule are not appreciably hypermetabolic catch that an indistinct other small nodules are not appreciably hypermetabolic. There is some mildly accentuated metabolic activity associated with the ground-glass opacity in the posterior basal segment right lower lobe which is likely inflammatory given the morphology. Similarly some faint  ground-glass opacity in the right upper lobe along the peribronchovascular region has subtle accentuation in activity which is probably inflammatory. No hypermetabolic adenopathy. Coronary, aortic arch, and branch vessel atherosclerotic vascular disease. Severe emphysema. Honeycombing in the lung bases. ABDOMEN/PELVIS No abnormal hypermetabolic activity within the liver, pancreas, adrenal glands, or spleen. No hypermetabolic lymph nodes in the abdomen or pelvis. Mildly accentuated activity eccentric to the left in the prostate gland, maximum SUV 5.8. Physiologic activity in bowel. Photopenic right kidney upper pole cyst. Dependent density in the gallbladder compatible with sludge or possibly stones. Small nonobstructive right kidney lower pole renal calculus. Aortoiliac atherosclerotic vascular disease. SKELETON No focal hypermetabolic activity to suggest skeletal metastasis. IMPRESSION: 1. The left lower lobe nodule is mildly hypermetabolic with maximum SUV 2.9, high suspicion for malignancy. No findings of nodal involvement or other significant hypermetabolic pulmonary nodules at this time. 2. Mildly accentuated activity eccentric to the left of the prostate gland. This is nonspecific, but correlation with PSA level is recommended. 3. Other imaging findings of potential clinical significance: Aortic Atherosclerosis (ICD10-I70.0) and Emphysema (ICD10-J43.9). Nonobstructive right nephrolithiasis. Sludge or small stones in the gallbladder. Electronically Signed   By: Van Clines M.D.   On: 08/20/2017 13:50   Ct Biopsy  Result Date: 09/01/2017 INDICATION: 71 year old male with indeterminate left lower lobe pulmonary nodule. Patient is a smoker with emphysema. EXAM: CT-GUIDED BIOPSY OF LEFT LOWER LOBE LUNG NODULE. MEDICATIONS: None. ANESTHESIA/SEDATION: Moderate (conscious) sedation was employed during this procedure. A total of Versed 1.0 mg and Fentanyl 50 mcg was administered intravenously. Moderate  Sedation Time: 25 minutes. The patient's level of consciousness and vital signs were monitored continuously by radiology nursing throughout the procedure under my direct supervision. FLUOROSCOPY TIME:  None COMPLICATIONS: None immediate. PROCEDURE: Informed written consent was obtained from the patient after a thorough discussion of the procedural risks, benefits and alternatives. All questions were addressed. A timeout was performed prior to the initiation of the procedure. Patient was placed prone. CT images through the lower chest were obtained. The nodule in the posterior left lower lobe was targeted. The left side of back was prepped and draped in sterile fashion. Skin was anesthetized with 1% lidocaine. 17 gauge needle was directed into the pulmonary nodule with CT guidance. Needle required multiple repositioning due to the small size. Needle was placed along the lateral aspect of the nodule and a single core biopsy was obtained with an 18 gauge core device. No additional core biopsies were obtained because of parenchymal hemorrhage and needle position moved after the initial biopsy. An adequate sample was obtained and placed  in formalin. The 17 gauge needle was removed using the BioSentry tract sealant. No evidence for pneumothorax following needle removal. Bandage placed over the puncture site. FINDINGS: Emphysematous changes with an irregular 1 cm nodule in the posterior left lower lobe. Small amount of parenchymal hemorrhage around the lesion following core biopsy. Negative for pneumothorax. IMPRESSION: Successful CT-guided core biopsy of the left lower lobe pulmonary nodule. Electronically Signed   By: Markus Daft M.D.   On: 09/01/2017 17:04   Dg Chest Port 1 View  Result Date: 09/01/2017 CLINICAL DATA:  Status post left lung biopsy. EXAM: PORTABLE CHEST 1 VIEW COMPARISON:  PET-CT 08/20/2017, CT chest 07/28/2017 FINDINGS: There is bilateral chronic interstitial lung disease. There is increased left  lower lobe airspace opacity most consistent with mild alveolar hemorrhage from recent left lower lobe core biopsy. There is no other focal parenchymal opacity. There is no pleural effusion or pneumothorax. The heart and mediastinal contours are unremarkable. The osseous structures are unremarkable. IMPRESSION: 1. No pneumothorax status post left lung biopsy. 2. Increased left lower lobe airspace opacity most consistent with mild alveolar hemorrhage from recent left lower lobe core biopsy. Electronically Signed   By: Kathreen Devoid   On: 09/01/2017 16:59   PATHOLOGY:  Diagnosis Lung, needle/core biopsy(ies), Left Lower Lobe - POORLY DIFFERENTIATED NON-SMALL CELL CARCINOMA. Microscopic Comment Immunohistochemistry will be performed and reported as an addendum. (JDP:ecj 09/02/2017) Claudette Laws MD Pathologist, Electronic Signature (Case signed 09/02/2017)  ASSESSMENT/PLAN:  Non-small cell cancer of left lung [facility]) This is a pleasant 71 year old white male with poorly differentiated non-small cell lung carcinoma diagnosed in October 2018.  The patient was seen with Dr. Julien Nordmann who had a lengthy discussion with the patient and his sister-in-law.   We discussed with the patient that the biopsy did confirm that he does have lung cancer.  PFTs were reviewed and we discussed that the patient may be a candidate for resection.   The patient will be referred to cardiothoracic surgery for evaluation for possible resection of this left lower lobe lung mass.  If it is determined that he is not a surgical candidate, will consider referral to radiation oncology for stereotactic radiation to this lung nodule.  The patient will be referred back to [place] after his surgical consult.  All questions were answered.  The patient was advised to call immediately if he has any concerning symptoms in the interval.  Orders Placed This Encounter  Procedures  . Ambulatory referral to Cardiothoracic Surgery    Referral Priority:    Routine    Referral Type:   Surgical    Referral Reason:   Specialty Services Required    Referred to Provider:   Melrose Nakayama, MD    Requested Specialty:   Cardiothoracic Surgery    Number of Visits Requested:   1    Mikey Bussing, [place], AGPCNP-BC, AOCNP 09/06/17  ADDENDUM: Hematology/Oncology Attending: I had a face-to-face encounter with the patient.  I recommended his care plan.  This is a very pleasant 71 years old white male with recently diagnosed with stage IA poorly differentiated non-small cell lung cancer presented with left lower lobe lung nodule. I had a lengthy discussion with the patient and his friend today about his current disease stage, prognosis and treatment options.  He had pulmonary function test performed recently.  I strongly recommended for the patient to consider referral to thoracic surgery for consideration of surgical resection. If the patient is not a good surgical candidate, we would refer  him to radiation oncology for consideration for stereotactic radiotherapy. We will arrange for the patient to come back for follow-up visit after his surgical resection for evaluation and discussion of any treatment options if needed. The patient was advised to call immediately if he has any concerning symptoms in the interval.  Disclaimer: This [name] was dictated with voice recognition software. Similar sounding words can inadvertently be transcribed and may be missed upon review. Eilleen Kempf, MD 09/06/17

## 2017-09-06 IMAGING — CT CT CHEST W/O CM
2 of 4 series · 14 of 30 positions shown, 16 images · non-contrast
Comparison: Chest radiograph April 29, 2016; chest CT October 07, 2015

CLINICAL DATA: Cough and shortness of breath.  Pulmonary nodules

EXAM:
CT CHEST WITHOUT CONTRAST
TECHNIQUE: Multidetector CT imaging of the chest was performed following the
standard protocol without IV contrast.

[Series 3: chest w/o · axial · non-contrast · 0.70mm/px · z∈[-240,-14]mm · 7 of 121 slices shown, 9 images]
[im 16/121  mediastinal]
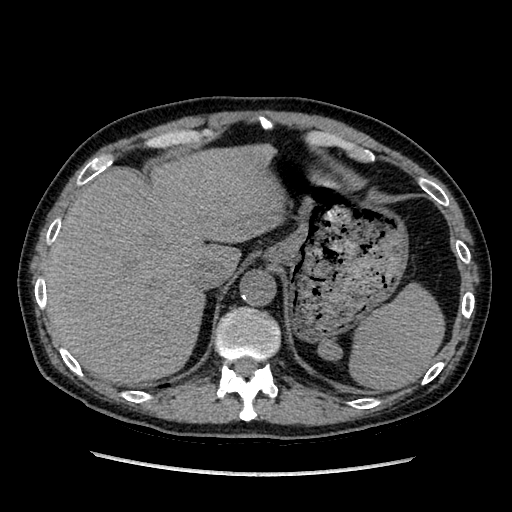
[im 16/121  lung]
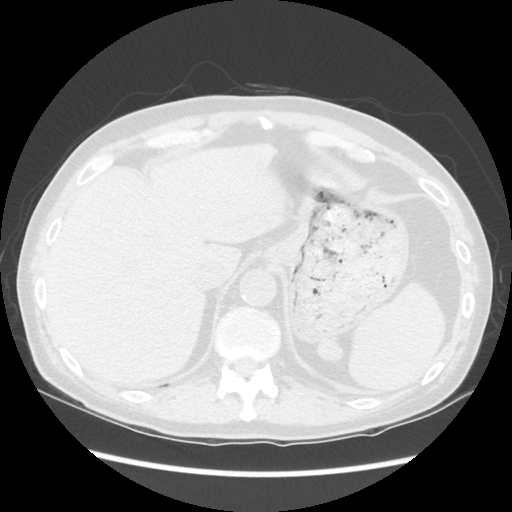
[im 31/121  lung]
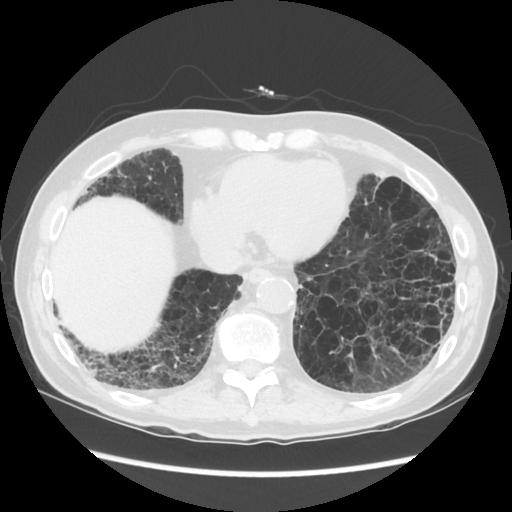
[im 46/121  lung]
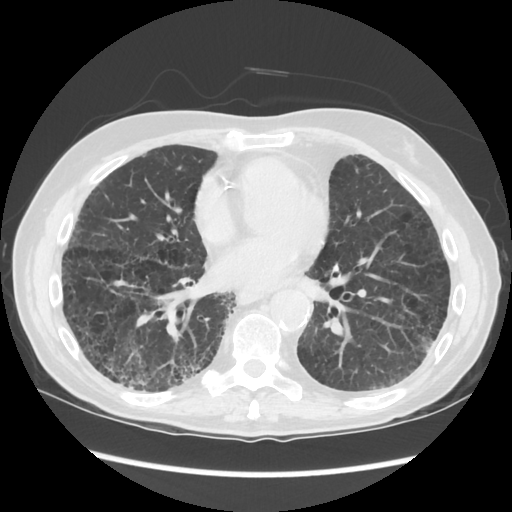
[im 61/121  lung]
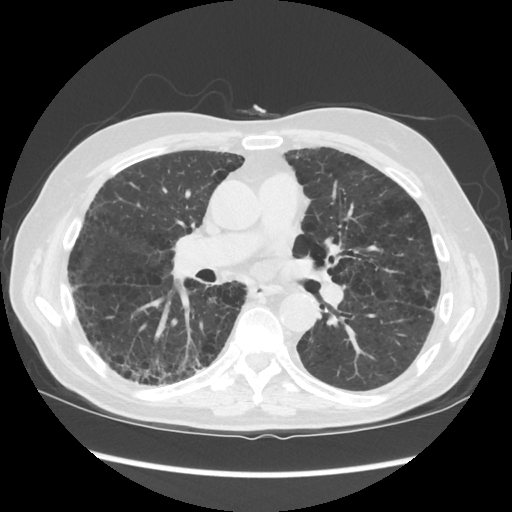
[im 76/121  mediastinal]
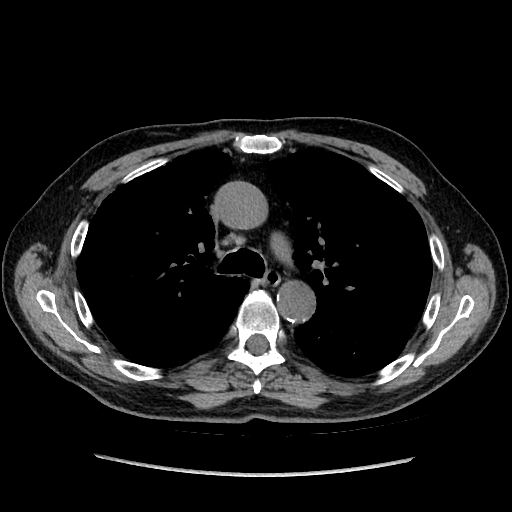
[im 76/121  lung]
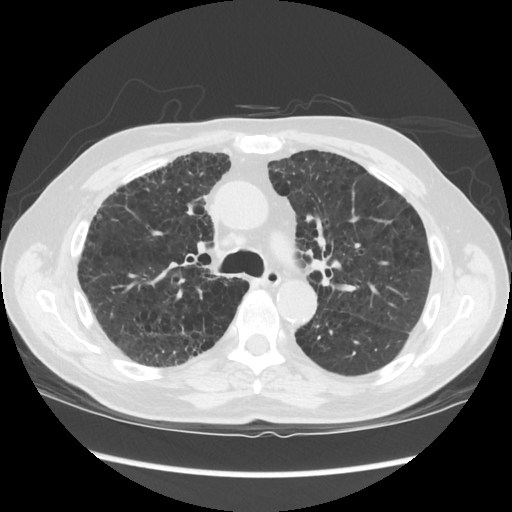
[im 91/121  lung]
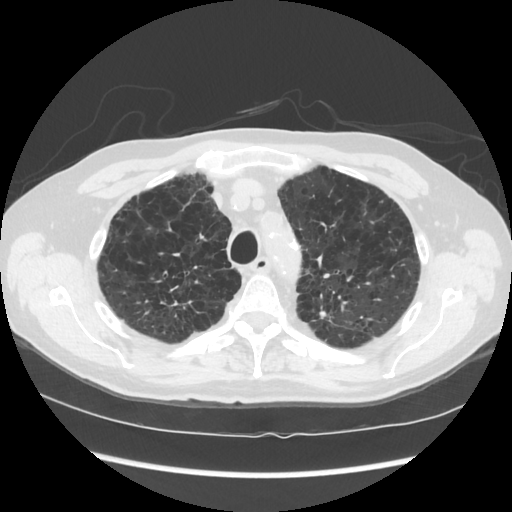
[im 106/121  lung]
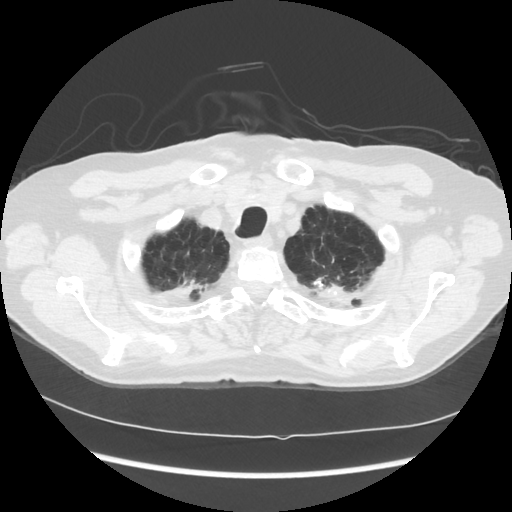

[Series 4: lung windows · axial · 0.70mm/px · z∈[-240,-14]mm · 7 of 121 slices shown]
[im 16/121  lung]
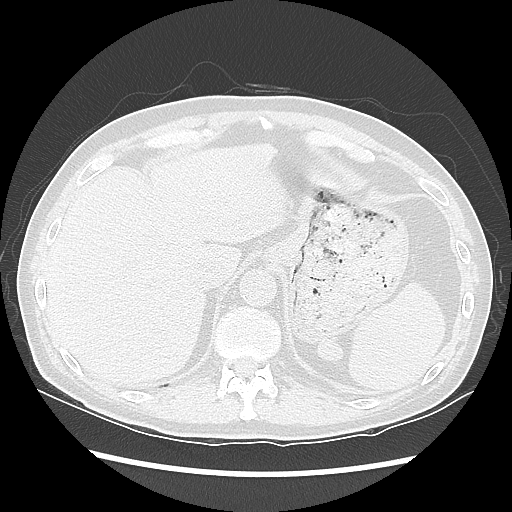
[im 31/121  lung]
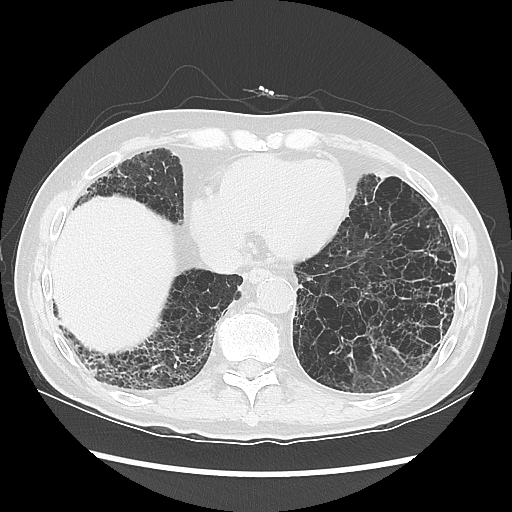
[im 46/121  lung]
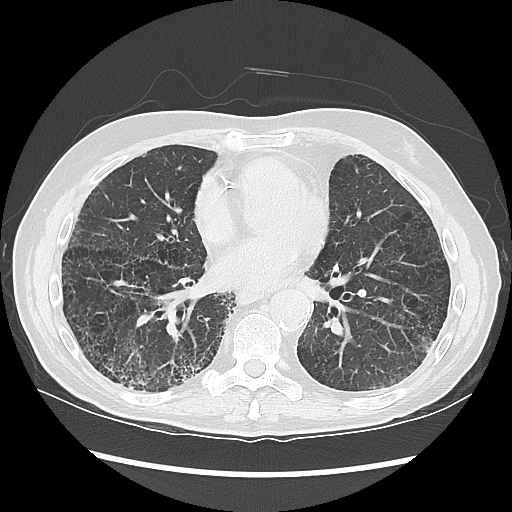
[im 61/121  lung]
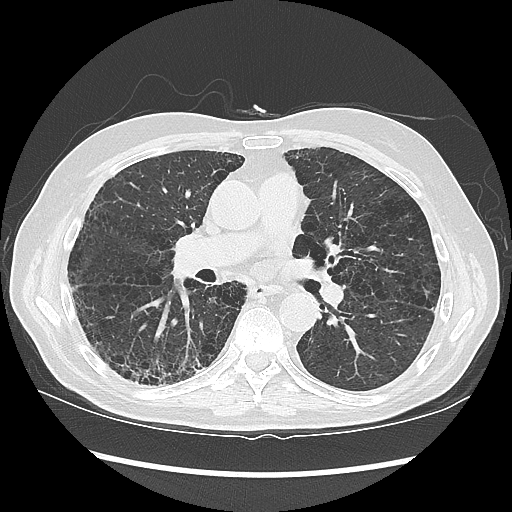
[im 76/121  lung]
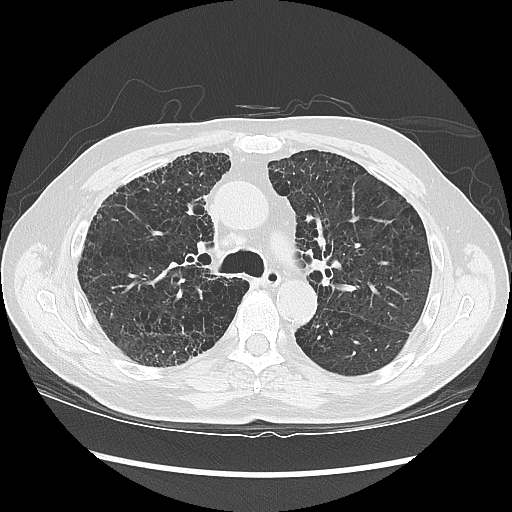
[im 91/121  lung]
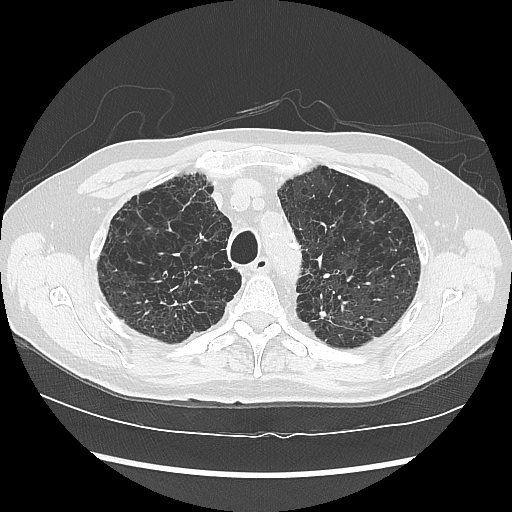
[im 106/121  lung]
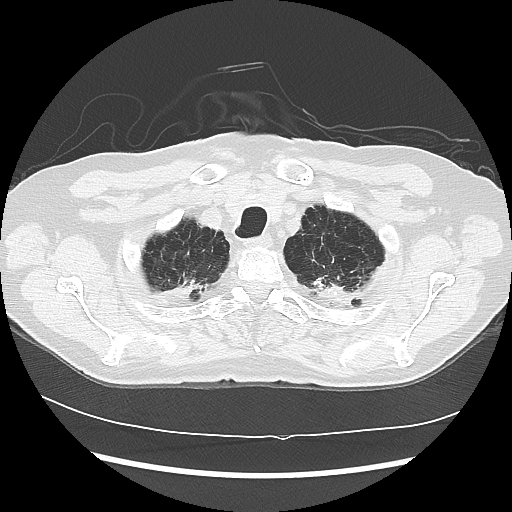

[14 of 30 positions shown; findings below may reference images not displayed]

FINDINGS: Cardiovascular: There is no thoracic aortic aneurysm. There are
scattered foci of atherosclerotic calcification in the proximal
visualized great vessels. No high-grade obstruction identified in
these areas on noncontrast enhanced study. There is patchy
atherosclerotic calcification in the aorta. There are foci of
coronary artery calcification evident. Pericardium is minimally
thickened without pericardial effusion.

Mediastinum/Nodes: Thyroid appears unremarkable. There are scattered
subcentimeter lymph nodes. There is no adenopathy by size criteria
in the chest.

Lungs/Pleura: There is centrilobular emphysematous change with
multiple scattered bullae. There is scarring in the lung apices. On
axial slice 25 series 4, there is a stable nodular opacity measuring
6 x 5 mm. This lesion as higher attenuation at this time compared to
prior study, possibly indicating developing calcification. There is
an area of opacity with cicatrization in the posterior segment of
the left upper lobe seen on axial slice 29 series 4 measuring 6 x 4
mm, not appreciably changed. The irregular opacity in the anterior
segment of the right upper lobe which previously measured 11 mm in
maximum length appears marginally smaller at this time, measuring 9
x 5 mm. This lesion is apparent on axial slice 43 series 4.

In comparison with the previous study, there is a new nodular
opacity in the superior segment of the left lower lobe seen on axial
slice 80 series 4 measuring 6 x 6 mm.

In comparison with the previous study, there is increase in fibrosis
in the lung bases. There is mild traction bronchiectasis in the
lower lobes as well.

There is no appreciable pleural effusion or pleural thickening.

Upper abdomen: In the visualized upper abdomen, there is
atherosclerotic calcification in the aorta. There is a small
calcified granuloma in the anterior segment right lobe of the liver.
The visualized upper abdominal structures otherwise appear normal.
Small splenules are noted incidentally.

Musculoskeletal:  There are no blastic or lytic bone lesions.
IMPRESSION: There is a new 6 mm nodular opacity in the left lower lobe. Other
nodular lesions are either stable or slightly smaller. Non-contrast
chest CT at 6-12 months is recommended. If the nodule is stable at
time of repeat CT, then future CT at 18-24 months (from today's
scan) is considered optional for low-risk patients, but is
recommended for high-risk patients. This recommendation follows the
consensus statement: Guidelines for Management of Incidental
Pulmonary Nodules Detected on CT Images: From the [HOSPITAL]
6832; Radiology 6832; [DATE]. Given the change from the prior
study, repeat imaging in 6 months is felt to be optimal.

Extensive underlying centrilobular emphysema. Changes in the bases
have progressed suggesting superimposed usual interstitial
pneumonitis. There is no airspace consolidation or edema.

Areas of atherosclerotic calcification noted in the aorta, great
vessels, and at several coronary artery sites.

No adenopathy evident.

## 2017-09-06 NOTE — Assessment & Plan Note (Signed)
This is a pleasant 71 year old white male with poorly differentiated non-small cell lung carcinoma diagnosed in October 2018.  The patient was seen with Dr. Julien Nordmann who had a lengthy discussion with the patient and his sister-in-law.   We discussed with the patient that the biopsy did confirm that he does have lung cancer.  PFTs were reviewed and we discussed that the patient may be a candidate for resection.   The patient will be referred to cardiothoracic surgery for evaluation for possible resection of this left lower lobe lung mass.  If it is determined that he is not a surgical candidate, will consider referral to radiation oncology for stereotactic radiation to this lung nodule.  The patient will be referred back to [place] after his surgical consult.  All questions were answered.  The patient was advised to call immediately if he has any concerning symptoms in the interval.

## 2017-09-12 ENCOUNTER — Institutional Professional Consult (permissible substitution) (INDEPENDENT_AMBULATORY_CARE_PROVIDER_SITE_OTHER): Payer: Medicare Other | Admitting: Thoracic Surgery (Cardiothoracic Vascular Surgery)

## 2017-09-12 ENCOUNTER — Other Ambulatory Visit: Payer: Self-pay

## 2017-09-12 ENCOUNTER — Encounter: Payer: Self-pay | Admitting: Thoracic Surgery (Cardiothoracic Vascular Surgery)

## 2017-09-12 VITALS — BP 154/60 | HR 71 | Resp 18 | Ht 70.0 in | Wt 145.0 lb

## 2017-09-12 DIAGNOSIS — C3432 Malignant neoplasm of lower lobe, left bronchus or lung: Secondary | ICD-10-CM

## 2017-09-12 DIAGNOSIS — J8410 Pulmonary fibrosis, unspecified: Secondary | ICD-10-CM | POA: Diagnosis not present

## 2017-09-12 DIAGNOSIS — J432 Centrilobular emphysema: Secondary | ICD-10-CM

## 2017-09-12 DIAGNOSIS — C3492 Malignant neoplasm of unspecified part of left bronchus or lung: Secondary | ICD-10-CM

## 2017-09-12 NOTE — Progress Notes (Signed)
PCP is [name], MD Referring Provider is Maryanna Shape, NP  Chief Complaint  Patient presents with  . Lung Cancer    NSC of the LLLobe...CT CHEST 10/11/, PET 11/3, BX 11/15, PFT 08/31/17    HPI: 71 yo man sent for consultation re: non-small cell carcinoma left lower lobe  Kenneth [name] is a 71 year old gentleman with a past medical history significant for tobacco abuse, COPD, rheumatoid arthritis, chronic pain, gout, left lower remedy DVT, anxiety, depression, gynecomastia, and hyperlipidemia.  He has a 50-pack-year history of smoking and continues to smoke 1/2 pack of cigarettes daily.  He was first found to have a lung nodule back in 2006.  These were left upper lobe nodules that have remained stable over time.  A CT in April 2018 showed a new 6 mm left lower lobe nodule.  A follow-up CT at 6 months was done in October.  The left lower lobe nodule had enlarged to 10 mm.  He was referred to Dr. Julien Nordmann.  A PET/CT showed the nodule was hypermetabolic.  CT-guided biopsy showed non-small cell carcinoma.  He is accompanied by his wife and sister-in-law.  They report a 10 pound weight loss over the past 3 months.  He gets short of breath with exertion, but seldom exerts himself.  He suffers from chronic back pain and dizzy spells and is in bed the majority of the time.  He has difficulty walking, anxiety, and panic attacks.  He bruises easily.  He has had a productive cough and wheezing.  He denies hemoptysis.  He denies any chest pain, pressure, or tightness with exertion. Zubrod Score: At the time of surgery this patient's most appropriate activity status/level should be described as: []     0    Normal activity, no symptoms []     1    Restricted in physical strenuous activity but ambulatory, able to do out light work []     2    Ambulatory and capable of self care, unable to do work activities, up and about >50 % of waking hours                              [x]     3    Only limited self care,  in bed greater than 50% of waking hours []     4    Completely disabled, no self care, confined to bed or chair []     5    Moribund   Past Medical History:  Diagnosis Date  . Abnormal LFTs   . Anxiety   . COPD (chronic obstructive pulmonary disease) (HCC)    emphysema.  . CTS (carpal tunnel syndrome)    Left  . DDD (degenerative disc disease)   . Depression    mild  . Elevated MCV   . Emphysema of lung ([place])   . Gout   . Gynecomastia   . Hearing loss   . Hyperlipidemia   . Microhematuria   . Nephrolithiasis   . Ocular migraine   . Panic attack   . Rheumatoid arthritis(714.0) dx 2000  . Situational hypertension   . Tobacco abuse     Past Surgical History:  Procedure Laterality Date  . APPENDECTOMY    . COLONOSCOPY    . CYSTOSTOMY W/ BLADDER BIOPSY  2006  . ESOPHAGOGASTRODUODENOSCOPY N/A 04/30/2016   Procedure: ESOPHAGOGASTRODUODENOSCOPY (EGD);  Surgeon: Doran Stabler, MD;  Location: [facility];  Service: Endoscopy;  Laterality: N/A;    Family History  Problem Relation Age of Onset  . Lung cancer Father   . Asthma Paternal Grandmother   . Lung cancer Paternal Aunt   . Breast cancer Maternal Aunt   . Breast cancer Maternal Aunt   . Stomach cancer Maternal Uncle   . Colon cancer Neg Hx     Social History Social History   Tobacco Use  . Smoking status: Current Every Day Smoker    Packs/day: 0.50    Years: 44.00    Pack years: 22.00    Types: Cigarettes  . Smokeless tobacco: Never Used  Substance Use Topics  . Alcohol use: No  . Drug use: No    Current Outpatient Medications  Medication Sig Dispense Refill  . acetaminophen (TYLENOL) 325 MG tablet Take 325 mg by mouth every 4 (four) hours as needed for mild pain.     Marland Kitchen amLODipine (NORVASC) 5 MG tablet Take 1 tablet daily by mouth.  3  . apixaban (ELIQUIS) 2.5 MG TABS tablet Take 2.5 mg 2 (two) times daily by mouth.    . budesonide-formoterol (SYMBICORT) 80-4.5 MCG/ACT inhaler Inhale 2 puffs into the  lungs 2 (two) times daily.    . cholecalciferol (VITAMIN D) 1000 UNITS tablet Take 1,000 Units by mouth daily.      . clonazePAM (KLONOPIN) 0.5 MG tablet Take 0.5 mg at bedtime by mouth.     . Dextromethorphan-Guaifenesin (TUSSIN DM PO) Take 1 tablet by mouth every 6 (six) hours as needed (cough and cold).     . fluticasone (FLONASE) 50 MCG/ACT nasal spray Place 2 sprays into the nose daily.      . folic acid (FOLVITE) 1 MG tablet Take 1 mg by mouth daily.      Marland Kitchen HYDROcodone-acetaminophen (NORCO/VICODIN) 5-325 MG tablet Take 1 tablet every 6 (six) hours as needed by mouth for moderate pain.    . methotrexate (RHEUMATREX) 2.5 MG tablet Take 7.5 mg by mouth 2 (two) times a week. Take 3 tabs every Friday 3 tabs on Sunday    . nystatin (MYCOSTATIN) 100000 UNIT/ML suspension Take 5 mLs (500,000 Units total) by mouth 4 (four) times daily. (Patient taking differently: Take 5 mLs 4 (four) times daily as needed by mouth (Mouth irritations). ) 160 mL 0  . pantoprazole (PROTONIX) 40 MG tablet Take 1 tablet (40 mg total) by mouth daily. 30 tablet 0  . rosuvastatin (CRESTOR) 20 MG tablet Take 10 mg every evening by mouth.     . Tiotropium Bromide Monohydrate (SPIRIVA RESPIMAT) 1.25 MCG/ACT AERS Inhale 2 puffs into the lungs daily.    . valACYclovir (VALTREX) 500 MG tablet Take 500 mg 2 (two) times daily by mouth.    . zoledronic acid (RECLAST) 5 MG/100ML SOLN Inject 5 mg into the vein once. Every 2 years      No current facility-administered medications for this visit.     Allergies  Allergen Reactions  . Other     "CILLIN" Family  Has patient had a PCN reaction causing immediate rash, facial/tongue/throat swelling, SOB or lightheadedness with hypotension: no Has patient had a PCN reaction causing severe rash involving mucus membranes or skin necrosis: yes Has patient had a PCN reaction that required hospitalization: no Has patient had a PCN reaction occurring within the last 10 years: no If all of the  above answers are "NO", then may proceed with Cephalosporin use.   . Aspirin Other (See Comments)    Gi upset  .  Naproxen Sodium Other (See Comments)    Whelps in large doses **ALEVE**    Review of Systems  Constitutional: Positive for activity change, fatigue and unexpected weight change.  HENT: Negative for trouble swallowing and voice change.   Respiratory: Positive for cough, shortness of breath and wheezing.   Cardiovascular: Negative for chest pain and leg swelling.  Gastrointestinal: Positive for abdominal pain (Reflux) and constipation.  Genitourinary: Negative for difficulty urinating and dysuria.  Musculoskeletal: Positive for arthralgias, back pain, joint swelling and myalgias.  Neurological: Positive for dizziness. Negative for seizures and syncope.  Hematological: Negative for adenopathy. Bruises/bleeds easily.  Psychiatric/Behavioral: Positive for dysphoric mood. The patient is nervous/anxious.   All other systems reviewed and are negative.   BP (!) 154/60 (BP Location: Left Arm, Patient Position: Sitting, Cuff Size: Normal)   Pulse 71 Comment: IRREGULAR  Resp 18   Ht 5\' 10"  (1.778 m)   Wt 145 lb (65.8 kg)   SpO2 96% Comment: ON RA  BMI 20.81 kg/m  Physical Exam  Constitutional: He is oriented to person, place, and time.  Chronically ill-appearing 71 year old man in no acute distress  HENT:  Head: Normocephalic and atraumatic.  Mouth/Throat: No oropharyngeal exudate.  Eyes: Conjunctivae and EOM are normal. No scleral icterus.  Neck: Neck supple. No thyromegaly present.  Cardiovascular: Exam reveals no gallop and no friction rub.  No murmur heard. Irregular rhythm  Pulmonary/Chest: Effort normal. He has no wheezes. He has rales (Bilaterally right greater than left).  Abdominal: Soft. He exhibits distension. There is no tenderness.  Musculoskeletal: He exhibits deformity (Ulnar deviation bilaterally). He exhibits no edema.  Lymphadenopathy:    He has no  cervical adenopathy.  Neurological: He is alert and oriented to person, place, and time. No cranial nerve deficit.  Skin: Skin is warm and dry.  Vitals reviewed.    Diagnostic Tests: CT CHEST WITHOUT CONTRAST  TECHNIQUE: Multidetector CT imaging of the chest was performed following the standard protocol without IV contrast.  COMPARISON:  Chest CTs dated 01/25/2017 and 05/20/2014.  FINDINGS: Cardiovascular: Heart size is normal. No pericardial effusion. Coronary artery calcifications noted. Aortic atherosclerosis. No aortic aneurysm.  Mediastinum/Nodes: No mass or enlarged lymph nodes within the mediastinum or perihilar regions. Esophagus appears normal.  Lungs/Pleura: The diffuse bilateral emphysematous change, moderate to severe in degree, is stable.  The 7 mm pulmonary nodule within the left upper lobe, lateral aspect, is stable (series 4, image 26).  The irregular nodular density within the left upper lobe, posterior aspect, is stable compared to the previous exams suggesting chronic nodular scarring/fibrosis.  The irregular nodular density within the left lower lobe, posterior aspect, has increased in size with a current measurement of 10 mm (measured 6 mm on the most recent prior study, new compared to the earlier exams) (series 4, image 76).  No new pulmonary nodules.  No pleural effusion or pneumothorax.  Upper Abdomen: No acute findings.  Musculoskeletal: Mild degenerative spurring within the thoracic spine. No acute or suspicious osseous finding.  IMPRESSION: 1. Enlarging nodule within the left lower lobe, posterior aspect, current measurement of 10 mm (measured 6 mm on the most recent chest CT of 01/25/2017, new compared to earlier exams). This is highly suspicious for a neoplastic nodule. Recommend tissue sampling for definitive characterization. If further imaging characterization is desired for confirmation, consider PET-CT. 2. Stable  pulmonary nodule within the left upper lobe. Additional irregular nodule within the left upper lobe is also stable compared to multiple prior studies suggesting  chronic nodular scarring/fibrosis. No new pulmonary nodules. 3. Severe emphysematous change. 4. Coronary artery calcifications. Recommend correlation with any possible associated cardiac symptoms. 5. Aortic atherosclerosis.  Emphysema (ICD10-J43.9).  These results will be called to the ordering clinician or representative by the Radiologist Assistant, and communication documented in the PACS or zVision Dashboard.   Electronically Signed   By: Franki Cabot M.D.   On: 07/28/2017 15:03 NUCLEAR MEDICINE PET SKULL BASE TO THIGH  TECHNIQUE: 7.8 mCi F-18 FDG was injected intravenously. Full-ring PET imaging was performed from the skull base to thigh after the radiotracer. CT data was obtained and used for attenuation correction and anatomic localization.  FASTING BLOOD GLUCOSE:  Value: 84 mg/dl  COMPARISON:  Chest CT from 07/28/2017  FINDINGS: NECK  No hypermetabolic lymph nodes in the neck.  Common carotid atherosclerotic calcification.  CHEST  The left lower lobe nodule measures 1.0 cm in diameter on image 51/8 and has a maximum standard uptake value of 2.9. Other small pulmonary nodules including the left upper lobe nodule are not appreciably hypermetabolic catch that an indistinct other small nodules are not appreciably hypermetabolic. There is some mildly accentuated metabolic activity associated with the ground-glass opacity in the posterior basal segment right lower lobe which is likely inflammatory given the morphology. Similarly some faint ground-glass opacity in the right upper lobe along the peribronchovascular region has subtle accentuation in activity which is probably inflammatory.  No hypermetabolic adenopathy.  Coronary, aortic arch, and branch vessel atherosclerotic  vascular disease. Severe emphysema. Honeycombing in the lung bases.  ABDOMEN/PELVIS  No abnormal hypermetabolic activity within the liver, pancreas, adrenal glands, or spleen. No hypermetabolic lymph nodes in the abdomen or pelvis.  Mildly accentuated activity eccentric to the left in the prostate gland, maximum SUV 5.8.  Physiologic activity in bowel. Photopenic right kidney upper pole cyst. Dependent density in the gallbladder compatible with sludge or possibly stones. Small nonobstructive right kidney lower pole renal calculus. Aortoiliac atherosclerotic vascular disease.  SKELETON  No focal hypermetabolic activity to suggest skeletal metastasis.  IMPRESSION: 1. The left lower lobe nodule is mildly hypermetabolic with maximum SUV 2.9, high suspicion for malignancy. No findings of nodal involvement or other significant hypermetabolic pulmonary nodules at this time. 2. Mildly accentuated activity eccentric to the left of the prostate gland. This is nonspecific, but correlation with PSA level is recommended. 3. Other imaging findings of potential clinical significance: Aortic Atherosclerosis (ICD10-I70.0) and Emphysema (ICD10-J43.9). Nonobstructive right nephrolithiasis. Sludge or small stones in the gallbladder.   Electronically Signed   By: Van Clines M.D.   On: 08/20/2017 13:50  I personally reviewed the CT and PET/CT images and concur with the findings noted above  FVC 2.51 (56%) FEV1 1.63 (50%) TLC 5.73 (81%) DLCO 11.13 (34%)  Impression: Kenneth [name] is a 71 year old gentleman with a history of tobacco abuse, COPD and interstitial lung disease.  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  Options for treatment include surgery and radiation therapy.  I have serious concerns about his ability to tolerate a surgical resection.  He is in no way a candidate for lobectomy due to his markedly impaired  diffusion capacity and severe pulmonary fibrosis particularly on the side contralateral to the lung nodule.  He might potentially be a candidate for a wedge resection, but even with a limited resection I think he is going to have significant challenges recovering given his poor functional status.  In my opinion he might  do better with stereotactic radiation, if radiation oncology feels that is an option for him.  I do have some concerns about radiation given the potential for aggravation of his interstitial lung disease.  Will refer to radiation oncology to get an opinion regarding stereotactic radiation.  If that is not an option could see Kenneth [name] back and discuss a possible wedge resection.  Plan: Refer to Radiation Oncology F/u with Dr. Thad Ranger, MD [facility] [phone]

## 2017-09-14 ENCOUNTER — Telehealth: Payer: Self-pay | Admitting: *Deleted

## 2017-09-14 NOTE — Telephone Encounter (Signed)
Oncology Nurse Navigator Documentation  Oncology Nurse Navigator Flowsheets 09/14/2017  Navigator Location [facility]  Referral date to RadOnc/MedOnc 09/13/2017  Navigator Encounter Type Telephone/I received referral for patient to see Rad Onc at [facility].  I called and spoke with patient. I scheduled him for [place] on 09/22/17.  He verbalized understanding of appt time and place.   Telephone Outgoing Call  Treatment Phase Pre-Tx/Tx Discussion  Barriers/Navigation Needs Coordination of Care  Interventions Coordination of Care  Coordination of Care Appts  Acuity Level 1  Time Spent with Patient 15

## 2017-09-22 ENCOUNTER — Ambulatory Visit
Admission: RE | Admit: 2017-09-22 | Discharge: 2017-09-22 | Disposition: A | Discharge status: Home / Self Care | Payer: Medicare Other | Source: Ambulatory Visit | Attending: Radiation Oncology | Admitting: Radiation Oncology

## 2017-09-22 ENCOUNTER — Encounter: Payer: Self-pay | Admitting: General Practice

## 2017-09-22 ENCOUNTER — Ambulatory Visit: Payer: Medicare Other | Attending: Radiation Oncology | Admitting: Physical Therapy

## 2017-09-22 ENCOUNTER — Encounter: Payer: Self-pay | Admitting: *Deleted

## 2017-09-22 VITALS — BP 155/80 | HR 84 | Temp 97.9°F | Resp 18

## 2017-09-22 DIAGNOSIS — C3492 Malignant neoplasm of unspecified part of left bronchus or lung: Secondary | ICD-10-CM

## 2017-09-22 DIAGNOSIS — C3432 Malignant neoplasm of lower lobe, left bronchus or lung: Secondary | ICD-10-CM | POA: Insufficient documentation

## 2017-09-22 DIAGNOSIS — E785 Hyperlipidemia, unspecified: Secondary | ICD-10-CM | POA: Diagnosis not present

## 2017-09-22 DIAGNOSIS — Z510 Encounter for antineoplastic radiation therapy: Secondary | ICD-10-CM | POA: Insufficient documentation

## 2017-09-22 DIAGNOSIS — F419 Anxiety disorder, unspecified: Secondary | ICD-10-CM | POA: Diagnosis not present

## 2017-09-22 DIAGNOSIS — Z87442 Personal history of urinary calculi: Secondary | ICD-10-CM | POA: Insufficient documentation

## 2017-09-22 DIAGNOSIS — R293 Abnormal posture: Secondary | ICD-10-CM | POA: Diagnosis not present

## 2017-09-22 DIAGNOSIS — Z886 Allergy status to analgesic agent status: Secondary | ICD-10-CM | POA: Insufficient documentation

## 2017-09-22 DIAGNOSIS — M109 Gout, unspecified: Secondary | ICD-10-CM | POA: Diagnosis not present

## 2017-09-22 DIAGNOSIS — J439 Emphysema, unspecified: Secondary | ICD-10-CM | POA: Diagnosis not present

## 2017-09-22 DIAGNOSIS — F17210 Nicotine dependence, cigarettes, uncomplicated: Secondary | ICD-10-CM | POA: Insufficient documentation

## 2017-09-22 DIAGNOSIS — R918 Other nonspecific abnormal finding of lung field: Secondary | ICD-10-CM | POA: Insufficient documentation

## 2017-09-22 DIAGNOSIS — Z7901 Long term (current) use of anticoagulants: Secondary | ICD-10-CM | POA: Insufficient documentation

## 2017-09-22 DIAGNOSIS — M6281 Muscle weakness (generalized): Secondary | ICD-10-CM | POA: Insufficient documentation

## 2017-09-22 DIAGNOSIS — M069 Rheumatoid arthritis, unspecified: Secondary | ICD-10-CM | POA: Diagnosis not present

## 2017-09-22 DIAGNOSIS — Z801 Family history of malignant neoplasm of trachea, bronchus and lung: Secondary | ICD-10-CM | POA: Diagnosis not present

## 2017-09-22 DIAGNOSIS — Z79899 Other long term (current) drug therapy: Secondary | ICD-10-CM | POA: Diagnosis not present

## 2017-09-22 DIAGNOSIS — Z7951 Long term (current) use of inhaled steroids: Secondary | ICD-10-CM | POA: Diagnosis not present

## 2017-09-22 DIAGNOSIS — H9190 Unspecified hearing loss, unspecified ear: Secondary | ICD-10-CM | POA: Insufficient documentation

## 2017-09-22 DIAGNOSIS — Z803 Family history of malignant neoplasm of breast: Secondary | ICD-10-CM | POA: Diagnosis not present

## 2017-09-22 DIAGNOSIS — F329 Major depressive disorder, single episode, unspecified: Secondary | ICD-10-CM | POA: Insufficient documentation

## 2017-09-22 NOTE — Therapy (Signed)
[place], [place], [address] Phone: [phone]   Fax:  [phone]  Physical Therapy Evaluation  Patient Details  Name: Kenneth [name] MRN: [id number] Date of Birth: 1946-06-07 Referring Provider: Dr. Gery Pray   Encounter Date: 09/22/2017  PT End of Session - 09/22/17 1704    Visit Number  1    Number of Visits  1    PT Start Time  2694    PT Stop Time  1500 except 1422-1443    PT Time Calculation (min)  48 min    Activity Tolerance  Patient tolerated treatment well    Behavior During Therapy  [facility] for tasks assessed/performed       Past Medical History:  Diagnosis Date  . Abnormal LFTs   . Anxiety   . COPD (chronic obstructive pulmonary disease) (HCC)    emphysema.  . CTS (carpal tunnel syndrome)    Left  . DDD (degenerative disc disease)   . Depression    mild  . Elevated MCV   . Emphysema of lung ([place])   . Gout   . Gynecomastia   . Hearing loss   . Hyperlipidemia   . Microhematuria   . Nephrolithiasis   . Ocular migraine   . Panic attack   . Rheumatoid arthritis(714.0) dx 2000  . Situational hypertension   . Tobacco abuse     Past Surgical History:  Procedure Laterality Date  . APPENDECTOMY    . COLONOSCOPY    . CYSTOSTOMY W/ BLADDER BIOPSY  2006  . ESOPHAGOGASTRODUODENOSCOPY N/A 04/30/2016   Procedure: ESOPHAGOGASTRODUODENOSCOPY (EGD);  Surgeon: Doran Stabler, MD;  Location: [facility];  Service: Endoscopy;  Laterality: N/A;    There were no vitals filed for this visit.   Subjective Assessment - 09/22/17 1502    Subjective  "It's sitting in a hard back chair like this that bothers my back." "My wife is being treated for leukemia."    Patient is accompained by:  Family member sister-in-law    Pertinent History  Pt. had been followed due to h/o pulmonary nodule since 2006. Now with left lower lobe poorly differenctiated non small cell lung cancer consistent with  adenocarcinoma. No adenopathy seen on CT. Emphysema and he is a current smoker. h/o left CTS, lumbar DDD, and significant rheumatoid arthritis. Plans to have SBRT treatment.     Patient Stated Goals  get info from all lung clinic providers    Currently in Pain?  Yes    Pain Score  7     Pain Location  Back    Pain Orientation  Lower    Pain Descriptors / Indicators  Aching    Pain Type  Chronic pain    Aggravating Factors   bending    Pain Relieving Factors  being still         [facility] PT Assessment - 09/22/17 0001      Assessment   Medical Diagnosis  LLL poorly differenciated NSCLC consistent with adenocarcinoma    Referring Provider  Dr. Gery Pray    Onset Date/Surgical Date  07/28/17    Hand Dominance  Right    Prior Therapy  none      Precautions   Precaution Comments  cancer precautions      Restrictions   Weight Bearing Restrictions  No      Balance Screen   Has the patient fallen in the past 6 months  No    Has  the patient had a decrease in activity level because of a fear of falling?   Yes because of back problems and RA    Is the patient reluctant to leave their home because of a fear of falling?   No      Home Film/video editor residence    Living Arrangements  Spouse/significant other spouse has leukemia    Type of Home  House    Home Layout  Multi-level      Prior Function   Level of Independence  Independent    Leisure  no regular exercise      Cognition   Overall Cognitive Status  Within Functional Limits for tasks assessed      Observation/Other Assessments   Observations  thin gentleman with obvious RA hand deformities      Coordination   Gross Motor Movements are Fluid and Coordinated  Yes      Functional Tests   Functional tests  Sit to Stand      Sit to Stand   Comments  5 times in 30 seconds, well below average for age      Posture/Postural Control   Posture/Postural Control  Postural limitations    Postural  Limitations  Forward head      ROM / Strength   AROM / PROM / Strength  AROM      AROM   Overall AROM Comments  trunk AROM in standing:  flexion--reaches fingertips 18 inches to floor, extension and sidebend bilat. WFL; rotation 25% loss bilat.      Ambulation/Gait   Ambulation/Gait  Yes    Ambulation/Gait Assistance  6: Modified independent (Device/Increase time)    Assistive device  None    Stairs  -- uses railings      Balance   Balance Assessed  Yes      Dynamic Standing Balance   Dynamic Standing - Comments  reaches forward 15 inches in standing, above average for age             Objective measurements completed on examination: See above findings.              PT Education - 09/22/17 1705    Education provided  Yes    Education Details  posture, breathing, walking, CURE article on staying active, "Why exercise?" flyer, PT info; Prehab exercise class flyer    Person(s) Educated  Patient;Other (comment) sister-in-law    Methods  Explanation;Handout    Comprehension  Verbalized understanding            Lung Clinic Goals - 09/22/17 1712      Patient will be able to verbalize understanding of the benefit of exercise to decrease fatigue.   Status  Achieved      Patient will be able to verbalize the importance of posture.   Status  Achieved      Patient will be able to demonstrate diaphragmatic breathing for improved lung function.   Status  Achieved      Patient will be able to verbalize understanding of the role of physical therapy to prevent functional decline and who to contact if physical therapy is needed.   Status  Achieved           Plan - 09/22/17 1707    Clinical Impression Statement  This is a pleasant gentleman with RA deformities in his hands, now with NSCLC in left lower lobe.  He expects to have SBRT.  He has decreased  trunk flexibility and 30 second sit to stand is well below average; forward head posture.    History and  Personal Factors relevant to plan of care:  chronic back problems, significant RA, wife with leukemia whom he helps    Clinical Presentation  Evolving    Clinical Presentation due to:  just diagnosed and will start treatment soon    Clinical Decision Making  Moderate    Rehab Potential  Good    PT Frequency  One time visit    PT Treatment/Interventions  Patient/family education    PT Next Visit Plan  None planned; patient may attend Prehab exercise class    PT Home Exercise Plan  walking, breathing exercise    Consulted and Agree with Plan of Care  Patient       Patient will benefit from skilled therapeutic intervention in order to improve the following deficits and impairments:  Decreased range of motion, Decreased strength, Postural dysfunction  Visit Diagnosis: Non-small cell cancer of left lung ([place]) - Plan: PT plan of care cert/re-cert  Muscle weakness (generalized) - Plan: PT plan of care cert/re-cert  Abnormal posture - Plan: PT plan of care cert/re-cert  G-Codes - 80/99/83 1713    Functional Assessment Tool Used (Outpatient Only)  clinical judgement    Functional Limitation  Mobility: Walking and moving around    Mobility: Walking and Moving Around Current Status ([id number])  At least 20 percent but less than 40 percent impaired, limited or restricted    Mobility: Walking and Moving Around Goal Status [phone]  At least 20 percent but less than 40 percent impaired, limited or restricted    Mobility: Walking and Moving Around Discharge Status [phone])  At least 20 percent but less than 40 percent impaired, limited or restricted        Problem List Patient Active Problem List   Diagnosis Date Noted  . Non-small cell cancer of left lung ([employer]) 09/05/2017  . Lung nodule 08/01/2017  . Protein-calorie malnutrition, severe 04/30/2016  . Esophagitis 04/29/2016  . Dysphagia 04/29/2016  . Odynophagia   . Abnormal loss of weight   . Emphysema 09/28/2011  . Interstitial fibrosis  09/28/2011    SALISBURY,DONNA 09/22/2017, 5:16 PM  [place], [place], [address] Phone: [phone]   Fax:  [phone]  Name: Kenneth [name] MRN: [id number] Date of Birth: Sep 03, 1946  Serafina Royals, PT 09/22/17 5:16 PM

## 2017-09-22 NOTE — Progress Notes (Signed)
Radiation Oncology         (336) [phone] ________________________________  Multidisciplinary Thoracic Oncology Clinic [facility]) Initial Outpatient Consultation  Name: Kenneth [name] MRN: [id number]  Date: 09/22/2017  DOB: 05/26/1946  Kenneth [name], Kenneth [place]adel[place], MD  Kenneth [name], *   REFERRING PHYSICIAN: Melrose [name], *  DIAGNOSIS: Clinical Stage IA non-small cell lung cancer (adenocarcinoma)    ICD-10-CM   1. Non-small cell cancer of left lung ([place]) C34.92     HISTORY OF PRESENT ILLNESS::Kenneth [name] is a 71 y.o. male who is here today for newly diagnosed clinical stage IA non-small cell carcinoma of the left lower lobe. He has been followed since 2006 with CT scans for left upper lobe lung nodules. These have remained stable. His primary care provider ordered a CT of the chest in April 2018. His CT showed a new 6 mm nodular opacity in the left lower lobe. A repeat CT scan of the chest in October 2018 showed that the nodule in the left lower lobe was enlarging and measured 10 mm. He was referred to Dr. Julien Nordmann in medical oncology where a PET scan was ordered and showed high suspicion for malignancy in the nodule. The patient then proceeded to CT-guided biopsy on 09/01/2017 which revealed poorly differentiated non-small cell carcinoma.  The patient was then referred to Dr. Roxan Hockey for consideration of surgical resection. The patient is not felt to be a good candidate for lobectomy. Dr. Roxan Hockey feels that the patient may be a candidate for wedge resection but might do better with stereotactic radiation.   The patient was referred today for presentation in the multidisciplinary conference.  Radiology studies and pathology slides were presented there for review and discussion of treatment options.  A consensus was discussed regarding potential next steps. The patient presents today with his sister-in-law. He reports he takes methotrexate for rheumatoid arthritis.  Of note, the patient  has a 50-year smoking history and reports he has recently cut down from 1/2 pack to 1/4 pack.   PREVIOUS RADIATION THERAPY: No  PAST MEDICAL HISTORY:  has a past medical history of Abnormal LFTs, Anxiety, COPD (chronic obstructive pulmonary disease) ([place]), CTS (carpal tunnel syndrome), DDD (degenerative disc disease), Depression, Elevated MCV, Emphysema of lung ([place]), Gout, Gynecomastia, Hearing loss, Hyperlipidemia, Microhematuria, Nephrolithiasis, Ocular migraine, Panic attack, Rheumatoid arthritis(714.0) (dx 2000), Situational hypertension, and Tobacco abuse.    PAST SURGICAL HISTORY: Past Surgical History:  Procedure Laterality Date  . APPENDECTOMY    . COLONOSCOPY    . CYSTOSTOMY W/ BLADDER BIOPSY  2006  . ESOPHAGOGASTRODUODENOSCOPY N/A 04/30/2016   Procedure: ESOPHAGOGASTRODUODENOSCOPY (EGD);  Surgeon: Doran Stabler, MD;  Location: [facility];  Service: Endoscopy;  Laterality: N/A;    FAMILY HISTORY: family history includes Asthma in his paternal grandmother; Breast cancer in his maternal aunt and maternal aunt; Lung cancer in his father and paternal aunt; Stomach cancer in his maternal uncle.  SOCIAL HISTORY:  reports that he has been smoking cigarettes.  He has a 22.00 pack-year smoking history. he has never used smokeless tobacco. He reports that he does not drink alcohol or use drugs.  ALLERGIES: Other; Aspirin; and Naproxen sodium  MEDICATIONS:  Current Outpatient Medications  Medication Sig Dispense Refill  . acetaminophen (TYLENOL) 325 MG tablet Take 325 mg by mouth every 4 (four) hours as needed for mild pain.     Marland Kitchen amLODipine (NORVASC) 5 MG tablet Take 1 tablet daily by mouth.  3  . apixaban (ELIQUIS) 2.5 MG TABS tablet  Take 2.5 mg 2 (two) times daily by mouth.    . budesonide-formoterol (SYMBICORT) 80-4.5 MCG/ACT inhaler Inhale 2 puffs into the lungs 2 (two) times daily.    . cholecalciferol (VITAMIN D) 1000 UNITS tablet Take 1,000 Units by mouth daily.      .  clonazePAM (KLONOPIN) 0.5 MG tablet Take 0.5 mg at bedtime by mouth.     . Dextromethorphan-Guaifenesin (TUSSIN DM PO) Take 1 tablet by mouth every 6 (six) hours as needed (cough and cold).     . fluticasone (FLONASE) 50 MCG/ACT nasal spray Place 2 sprays into the nose daily.      . folic acid (FOLVITE) 1 MG tablet Take 1 mg by mouth daily.      Marland Kitchen HYDROcodone-acetaminophen (NORCO/VICODIN) 5-325 MG tablet Take 1 tablet every 6 (six) hours as needed by mouth for moderate pain.    . methotrexate (RHEUMATREX) 2.5 MG tablet Take 7.5 mg by mouth 2 (two) times a week. Take 3 tabs every Friday 3 tabs on Sunday    . nystatin (MYCOSTATIN) 100000 UNIT/ML suspension Take 5 mLs (500,000 Units total) by mouth 4 (four) times daily. (Patient taking differently: Take 5 mLs 4 (four) times daily as needed by mouth (Mouth irritations). ) 160 mL 0  . pantoprazole (PROTONIX) 40 MG tablet Take 1 tablet (40 mg total) by mouth daily. 30 tablet 0  . rosuvastatin (CRESTOR) 20 MG tablet Take 10 mg every evening by mouth.     . Tiotropium Bromide Monohydrate (SPIRIVA RESPIMAT) 1.25 MCG/ACT AERS Inhale 2 puffs into the lungs daily.    . valACYclovir (VALTREX) 500 MG tablet Take 500 mg 2 (two) times daily by mouth.    . zoledronic acid (RECLAST) 5 MG/100ML SOLN Inject 5 mg into the vein once. Every 2 years      No current facility-administered medications for this encounter.     REVIEW OF SYSTEMS:  REVIEW OF SYSTEMS: A 10+ POINT REVIEW OF SYSTEMS WAS OBTAINED including neurology, dermatology, psychiatry, cardiac, respiratory, lymph, extremities, GI, GU, musculoskeletal, constitutional, reproductive, HEENT. All pertinent positives are noted in the HPI. All others are negative.   PHYSICAL EXAM:  temperature is 97.9 F (36.6 C). His blood pressure is 155/80 (abnormal) and his pulse is 84. His respiration is 18 and oxygen saturation is 97%.   General: Alert and oriented, in no acute distress. HEENT: Head is normocephalic.  Extraocular movements are intact. Oropharynx is clear. Neck: Neck is supple, no palpable cervical or supraclavicular lymphadenopathy. Heart: Regular in rate and rhythm with no murmurs, rubs, or gallops. Chest: Bibasilar crackles.  Abdomen: Soft, nontender, nondistended, with no rigidity or guarding. Extremities: No cyanosis or edema. Distal pulses intact. Lymphatics: see Neck Exam Skin: No concerning lesions. Musculoskeletal: Symmetric strength and muscle tone throughout. Neurologic: Cranial nerves II through XII are grossly intact. No obvious focalities. Speech is fluent. Coordination is intact. Psychiatric: Judgment and insight are intact. Affect is appropriate.    KPS = 1  100 - Normal; no complaints; no evidence of disease. 90   - Able to carry on normal activity; minor signs or symptoms of disease. 80   - Normal activity with effort; some signs or symptoms of disease. 44   - Cares for self; unable to carry on normal activity or to do active work. 60   - Requires occasional assistance, but is able to care for most of his personal needs. 50   - Requires considerable assistance and frequent medical care. 44   - Disabled;  requires special care and assistance. 25   - Severely disabled; hospital admission is indicated although death not imminent. 64   - Very sick; hospital admission necessary; active supportive treatment necessary. 10   - Moribund; fatal processes progressing rapidly. 0     - Dead  Karnofsky DA, Abelmann Thayer, Craver LS and Burchenal [facility] [phone]) The use of the nitrogen mustards in the palliative treatment of carcinoma: with particular reference to bronchogenic carcinoma Cancer 1 634-56  LABORATORY DATA:  Lab Results  Component Value Date   WBC 9.6 09/01/2017   HGB 15.0 09/01/2017   HCT 43.6 09/01/2017   MCV 116.0 (H) 09/01/2017   PLT 146 (L) 09/01/2017   Lab Results  Component Value Date   NA 141 08/08/2017   K 5.0 08/08/2017   CL 117 (H) 05/01/2016   CO2 25  08/08/2017   Lab Results  Component Value Date   ALT 17 08/08/2017   AST 21 08/08/2017   ALKPHOS 84 08/08/2017   BILITOT 0.69 08/08/2017    PULMONARY FUNCTION TEST:   Recent Review Flowsheet Data    Spirometry Latest Ref Rng & Units 08/31/2017   FVC-%PRED-PRE % 56   FVC-%PRED-POST % 57   FEV1-PRE L 1.63   FEV1-%PRED-PRE % 50   FEV1-POST L 1.61   FEV1-%PRED-POST % 49   DLCO [facility] ml/min/mmHg 11.13      RADIOGRAPHY: Ct Biopsy  Result Date: 09/01/2017 INDICATION: 71 year old male with indeterminate left lower lobe pulmonary nodule. Patient is a smoker with emphysema. EXAM: CT-GUIDED BIOPSY OF LEFT LOWER LOBE LUNG NODULE. MEDICATIONS: None. ANESTHESIA/SEDATION: Moderate (conscious) sedation was employed during this procedure. A total of Versed 1.0 mg and Fentanyl 50 mcg was administered intravenously. Moderate Sedation Time: 25 minutes. The patient's level of consciousness and vital signs were monitored continuously by radiology nursing throughout the procedure under my direct supervision. FLUOROSCOPY TIME:  None COMPLICATIONS: None immediate. PROCEDURE: Informed written consent was obtained from the patient after a thorough discussion of the procedural risks, benefits and alternatives. All questions were addressed. A timeout was performed prior to the initiation of the procedure. Patient was placed prone. CT images through the lower chest were obtained. The nodule in the posterior left lower lobe was targeted. The left side of back was prepped and draped in sterile fashion. Skin was anesthetized with 1% lidocaine. 17 gauge needle was directed into the pulmonary nodule with CT guidance. Needle required multiple repositioning due to the small size. Needle was placed along the lateral aspect of the nodule and a single core biopsy was obtained with an 18 gauge core device. No additional core biopsies were obtained because of parenchymal hemorrhage and needle position moved after the initial biopsy.  An adequate sample was obtained and placed in formalin. The 17 gauge needle was removed using the BioSentry tract sealant. No evidence for pneumothorax following needle removal. Bandage placed over the puncture site. FINDINGS: Emphysematous changes with an irregular 1 cm nodule in the posterior left lower lobe. Small amount of parenchymal hemorrhage around the lesion following core biopsy. Negative for pneumothorax. IMPRESSION: Successful CT-guided core biopsy of the left lower lobe pulmonary nodule. Electronically Signed   By: Markus Daft M.D.   On: 09/01/2017 17:04   Dg Chest Port 1 View  Result Date: 09/01/2017 CLINICAL DATA:  Status post left lung biopsy. EXAM: PORTABLE CHEST 1 VIEW COMPARISON:  PET-CT 08/20/2017, CT chest 07/28/2017 FINDINGS: There is bilateral chronic interstitial lung disease. There is increased left lower lobe airspace opacity  most consistent with mild alveolar hemorrhage from recent left lower lobe core biopsy. There is no other focal parenchymal opacity. There is no pleural effusion or pneumothorax. The heart and mediastinal contours are unremarkable. The osseous structures are unremarkable. IMPRESSION: 1. No pneumothorax status post left lung biopsy. 2. Increased left lower lobe airspace opacity most consistent with mild alveolar hemorrhage from recent left lower lobe core biopsy. Electronically Signed   By: Kathreen Devoid   On: 09/01/2017 16:59      IMPRESSION: Stage IA non-small cell lung cancer (adenocarcinoma). The patient would be a good candidate for a definitive course of radiation therapy with SBRT techniques. Patient has met with surgery and he is not felt to be a good candidate for surgical approach. I discussed the course of treatment as well as side effects and potential toxicities with the patient and his family. He appears to understand and wishes to proceed with treatment. We discussed and signed the radiation oncology consent form, and a copy was retained for our  records.  PLAN: Patient is scheduled to undergo SBRT simulation on 09/27/2017 at 9:00 AM.     ------------------------------------------------  Blair Promise, PhD, MD  This document serves as a record of services personally performed by Gery Pray, MD. It was created on his behalf by Rae Lips, a trained medical scribe. The creation of this record is based on the scribe's personal observations and the provider's statements to them. This document has been checked and approved by the attending provider.

## 2017-09-22 NOTE — Progress Notes (Signed)
[place] Psychosocial Distress Screening Clinical Social Work  Clinical Social Work was referred by distress screening protocol.  The patient scored a 8 on the Psychosocial Distress Thermometer which indicates moderate distress. Clinical Social Worker Edwyna Shell to assess for distress and other psychosocial needs. CSW and patient discussed common feeling and emotions when being diagnosed with cancer, and the importance of support during treatment. CSW informed patient of the support team and support services at [facility]. CSW provided contact information and encouraged patient to call with any questions or concerns.  Pt reports main concern is wife who was diagnosed w cancer shortly after having foot surgery - while she was recovering, patient was her main support for transportation and physical care.  Wife now driving, doing better - pt reports they have significant support from family and friends, no transportation issues.  Receives medications from PCP for mental health needs.  CSW advised of resources in [facility], no further concerns at this time.   ONCBCN DISTRESS SCREENING 09/22/2017  Screening Type Initial Screening  Distress experienced in past week (1-10) 8  Family Problem type Partner  Emotional problem type Depression  Physical Problem type Pain;Getting around;Loss of appetitie;Skin dry/itchy  Physician notified of physical symptoms Yes  Referral to clinical psychology No  Referral to clinical social work Yes  Referral to dietition No  Referral to financial advocate No  Referral to support programs Yes    Clinical Social Worker follow up needed: No.  If yes, follow up plan:  Edwyna Shell, LCSW Clinical Social Worker Phone:  [phone]

## 2017-09-22 NOTE — Progress Notes (Signed)
Oncology Nurse Navigator Documentation  Oncology Nurse Navigator Flowsheets 09/22/2017  Navigator Location [facility]  Navigator Encounter Type Clinic/MDC/I spoke with patient today at [facility] clinic. I gave and explained information on next steps.  He is set up for [facility] on 09/27/17.    Abnormal Finding Date 07/28/2017  Confirmed Diagnosis Date 09/01/2017  Multidisiplinary Clinic Date 09/22/2017  Treatment Initiated Date 09/27/2017  Patient Visit Type MedOnc  Treatment Phase Pre-Tx/Tx Discussion  Barriers/Navigation Needs Education  Education Newly Diagnosed Cancer Education;Other  Interventions Education  Education Method Verbal;Written  Acuity Level 2  Time Spent with Patient 30

## 2017-09-27 ENCOUNTER — Ambulatory Visit: Admission: RE | Admit: 2017-09-27 | Payer: Medicare Other | Source: Ambulatory Visit | Admitting: Radiation Oncology

## 2017-09-28 ENCOUNTER — Telehealth: Payer: Self-pay | Admitting: *Deleted

## 2017-09-28 NOTE — Telephone Encounter (Signed)
Oncology Nurse Navigator Documentation  Oncology Nurse Navigator Flowsheets 09/28/2017  Navigator Location [facility]  Navigator Encounter Type Telephone/I followed from thoracic clinic last week. Ms. Kersh had a few questions about upcoming appt and I clarified.   Telephone Outgoing Call  Treatment Phase Pre-Tx/Tx Discussion  Barriers/Navigation Needs Education  Education Other  Interventions Education  Education Method Verbal  Acuity Level 2  Time Spent with Patient 30

## 2017-10-04 ENCOUNTER — Ambulatory Visit
Admission: RE | Admit: 2017-10-04 | Discharge: 2017-10-04 | Disposition: A | Discharge status: Home / Self Care | Payer: Medicare Other | Source: Ambulatory Visit | Attending: Radiation Oncology | Admitting: Radiation Oncology

## 2017-10-04 DIAGNOSIS — B019 Varicella without complication: Secondary | ICD-10-CM | POA: Diagnosis not present

## 2017-10-04 DIAGNOSIS — L570 Actinic keratosis: Secondary | ICD-10-CM | POA: Diagnosis not present

## 2017-10-04 DIAGNOSIS — C3492 Malignant neoplasm of unspecified part of left bronchus or lung: Secondary | ICD-10-CM

## 2017-10-04 DIAGNOSIS — R918 Other nonspecific abnormal finding of lung field: Secondary | ICD-10-CM | POA: Diagnosis not present

## 2017-10-04 DIAGNOSIS — L821 Other seborrheic keratosis: Secondary | ICD-10-CM | POA: Diagnosis not present

## 2017-10-04 DIAGNOSIS — M069 Rheumatoid arthritis, unspecified: Secondary | ICD-10-CM | POA: Diagnosis not present

## 2017-10-04 DIAGNOSIS — F17210 Nicotine dependence, cigarettes, uncomplicated: Secondary | ICD-10-CM | POA: Diagnosis not present

## 2017-10-04 DIAGNOSIS — C3432 Malignant neoplasm of lower lobe, left bronchus or lung: Secondary | ICD-10-CM | POA: Diagnosis not present

## 2017-10-04 DIAGNOSIS — L720 Epidermal cyst: Secondary | ICD-10-CM | POA: Diagnosis not present

## 2017-10-04 DIAGNOSIS — Z510 Encounter for antineoplastic radiation therapy: Secondary | ICD-10-CM | POA: Diagnosis not present

## 2017-10-04 DIAGNOSIS — J439 Emphysema, unspecified: Secondary | ICD-10-CM | POA: Diagnosis not present

## 2017-10-04 NOTE — Progress Notes (Signed)
  Radiation Oncology         (336) [phone] ________________________________  Name: Kenneth [name] MRN: [id number]  Date: 10/04/2017  DOB: 1945/10/30   STEREOTACTIC BODY RADIOTHERAPY SIMULATION AND TREATMENT PLANNING NOTE    DIAGNOSIS: Clinical Stage IA non-small cell lung cancer (adenocarcinoma)      ICD-10-CM   1. Non-small cell cancer of left lung ([facility]) C34.92        NARRATIVE:  The patient was brought to the [place].  Identity was confirmed.  All relevant records and images related to the planned course of therapy were reviewed.  The patient freely provided informed written consent to proceed with treatment after reviewing the details related to the planned course of therapy. The consent form was witnessed and verified by the simulation staff.  Then, the patient was set-up in a stable reproducible  supine position for radiation therapy.  A BodyFix immobilization pillow was fabricated for reproducible positioning.  Then I personally applied the abdominal compression paddle to limit respiratory excursion.  4D respiratoy motion management CT images were obtained.  Surface markings were placed.  The CT images were loaded into the planning software.  Then, using Cine, MIP, and standard views, the internal target volume (ITV) and planning target volumes (PTV) were delinieated, and avoidance structures were contoured.  Treatment planning then occurred.  The radiation prescription was entered and confirmed.  A total of two complex treatment devices were fabricated in the form of the BodyFix immobilization pillow and a neck accuform cushion.  I have requested : 3D Simulation  I have requested a DVH of the following structures: Heart, Lungs, Esophagus, Chest Wall, Brachial Plexus, Major Blood Vessels, and targets.  PLAN:  The patient will receive 54 Gy in 3 fractions directed at the left lower lung nodule.  -----------------------------------  Blair Promise, PhD, MD

## 2017-10-06 DIAGNOSIS — J439 Emphysema, unspecified: Secondary | ICD-10-CM | POA: Diagnosis not present

## 2017-10-06 DIAGNOSIS — M069 Rheumatoid arthritis, unspecified: Secondary | ICD-10-CM | POA: Diagnosis not present

## 2017-10-06 DIAGNOSIS — F17210 Nicotine dependence, cigarettes, uncomplicated: Secondary | ICD-10-CM | POA: Diagnosis not present

## 2017-10-06 DIAGNOSIS — R918 Other nonspecific abnormal finding of lung field: Secondary | ICD-10-CM | POA: Diagnosis not present

## 2017-10-06 DIAGNOSIS — Z510 Encounter for antineoplastic radiation therapy: Secondary | ICD-10-CM | POA: Diagnosis not present

## 2017-10-06 DIAGNOSIS — C3432 Malignant neoplasm of lower lobe, left bronchus or lung: Secondary | ICD-10-CM | POA: Diagnosis not present

## 2017-10-07 ENCOUNTER — Telehealth: Payer: Self-pay | Admitting: Internal Medicine

## 2017-10-07 NOTE — Telephone Encounter (Signed)
Per pt to fax records to the [place] [phone]

## 2017-10-17 ENCOUNTER — Ambulatory Visit
Admission: RE | Admit: 2017-10-17 | Discharge: 2017-10-17 | Disposition: A | Discharge status: Home / Self Care | Payer: Medicare Other | Source: Ambulatory Visit | Attending: Radiation Oncology | Admitting: Radiation Oncology

## 2017-10-17 DIAGNOSIS — R918 Other nonspecific abnormal finding of lung field: Secondary | ICD-10-CM | POA: Diagnosis not present

## 2017-10-17 DIAGNOSIS — J439 Emphysema, unspecified: Secondary | ICD-10-CM | POA: Diagnosis not present

## 2017-10-17 DIAGNOSIS — F17210 Nicotine dependence, cigarettes, uncomplicated: Secondary | ICD-10-CM | POA: Diagnosis not present

## 2017-10-17 DIAGNOSIS — C3432 Malignant neoplasm of lower lobe, left bronchus or lung: Secondary | ICD-10-CM | POA: Diagnosis not present

## 2017-10-17 DIAGNOSIS — C3492 Malignant neoplasm of unspecified part of left bronchus or lung: Secondary | ICD-10-CM

## 2017-10-17 DIAGNOSIS — M069 Rheumatoid arthritis, unspecified: Secondary | ICD-10-CM | POA: Diagnosis not present

## 2017-10-17 DIAGNOSIS — Z510 Encounter for antineoplastic radiation therapy: Secondary | ICD-10-CM | POA: Diagnosis not present

## 2017-10-17 NOTE — Progress Notes (Signed)
  Radiation Oncology         (336) [phone] ________________________________  Name: Kenneth [name] MRN: [id number]  Date: 10/17/2017  DOB: [birth year]  Stereotactic Body Radiotherapy Treatment Procedure Note  NARRATIVE:  Kenneth [name] was brought to the stereotactic radiation treatment machine and placed supine on the CT couch. The patient was set up for stereotactic body radiotherapy on the body fix pillow.  3D TREATMENT PLANNING AND DOSIMETRY:  The patient's radiation plan was reviewed and approved prior to starting treatment.  It showed 3-dimensional radiation distributions overlaid onto the planning CT.  The [facility] for the target structures as well as the organs at risk were reviewed. The documentation of this is filed in the radiation oncology EMR.  SIMULATION VERIFICATION:  The patient underwent CT imaging on the treatment unit.  These were carefully aligned to document that the ablative radiation dose would cover the target volume and maximally spare the nearby organs at risk according to the planned distribution.  SPECIAL TREATMENT PROCEDURE: Kenneth [name] received high dose ablative stereotactic body radiotherapy to the planned target volume without unforeseen complications. Treatment was delivered uneventfully. The high doses associated with stereotactic body radiotherapy and the significant potential risks require careful treatment set up and patient monitoring constituting a special treatment procedure   STEREOTACTIC TREATMENT MANAGEMENT:  Following delivery, the patient was evaluated clinically. The patient tolerated treatment without significant acute effects, and was discharged to home in stable condition.    PLAN: Continue treatment as planned.  ________________________________  Blair Promise, PhD, MD

## 2017-10-20 ENCOUNTER — Ambulatory Visit
Admission: RE | Admit: 2017-10-20 | Discharge: 2017-10-20 | Disposition: A | Discharge status: Home / Self Care | Payer: Medicare Other | Source: Ambulatory Visit | Attending: Radiation Oncology | Admitting: Radiation Oncology

## 2017-10-20 DIAGNOSIS — C3492 Malignant neoplasm of unspecified part of left bronchus or lung: Secondary | ICD-10-CM

## 2017-10-20 DIAGNOSIS — C3432 Malignant neoplasm of lower lobe, left bronchus or lung: Secondary | ICD-10-CM | POA: Diagnosis not present

## 2017-10-20 DIAGNOSIS — Z801 Family history of malignant neoplasm of trachea, bronchus and lung: Secondary | ICD-10-CM | POA: Diagnosis not present

## 2017-10-20 DIAGNOSIS — Z510 Encounter for antineoplastic radiation therapy: Secondary | ICD-10-CM | POA: Diagnosis not present

## 2017-10-20 DIAGNOSIS — H9190 Unspecified hearing loss, unspecified ear: Secondary | ICD-10-CM | POA: Diagnosis not present

## 2017-10-20 DIAGNOSIS — F329 Major depressive disorder, single episode, unspecified: Secondary | ICD-10-CM | POA: Diagnosis not present

## 2017-10-20 DIAGNOSIS — E785 Hyperlipidemia, unspecified: Secondary | ICD-10-CM | POA: Diagnosis not present

## 2017-10-20 DIAGNOSIS — Z79899 Other long term (current) drug therapy: Secondary | ICD-10-CM | POA: Diagnosis not present

## 2017-10-20 DIAGNOSIS — Z7901 Long term (current) use of anticoagulants: Secondary | ICD-10-CM | POA: Diagnosis not present

## 2017-10-20 DIAGNOSIS — Z886 Allergy status to analgesic agent status: Secondary | ICD-10-CM | POA: Diagnosis not present

## 2017-10-20 DIAGNOSIS — R918 Other nonspecific abnormal finding of lung field: Secondary | ICD-10-CM | POA: Diagnosis not present

## 2017-10-20 DIAGNOSIS — Z7951 Long term (current) use of inhaled steroids: Secondary | ICD-10-CM | POA: Diagnosis not present

## 2017-10-20 DIAGNOSIS — Z87442 Personal history of urinary calculi: Secondary | ICD-10-CM | POA: Diagnosis not present

## 2017-10-20 DIAGNOSIS — M069 Rheumatoid arthritis, unspecified: Secondary | ICD-10-CM | POA: Diagnosis not present

## 2017-10-20 DIAGNOSIS — J439 Emphysema, unspecified: Secondary | ICD-10-CM | POA: Diagnosis not present

## 2017-10-20 DIAGNOSIS — F419 Anxiety disorder, unspecified: Secondary | ICD-10-CM | POA: Diagnosis not present

## 2017-10-20 DIAGNOSIS — Z803 Family history of malignant neoplasm of breast: Secondary | ICD-10-CM | POA: Diagnosis not present

## 2017-10-20 DIAGNOSIS — M109 Gout, unspecified: Secondary | ICD-10-CM | POA: Diagnosis not present

## 2017-10-20 DIAGNOSIS — F17210 Nicotine dependence, cigarettes, uncomplicated: Secondary | ICD-10-CM | POA: Diagnosis not present

## 2017-10-20 NOTE — Progress Notes (Signed)
  Radiation Oncology         (336) [phone] ________________________________  Name: Kenneth [name] MRN: [id number]  Date: 10/20/2017  DOB: July 31, 1946  Stereotactic Body Radiotherapy Treatment Procedure Note  NARRATIVE:  ALASTAIR HENNES was brought to the stereotactic radiation treatment machine and placed supine on the CT couch. The patient was set up for stereotactic body radiotherapy on the body fix pillow.  3D TREATMENT PLANNING AND DOSIMETRY:  The patient's radiation plan was reviewed and approved prior to starting treatment.  It showed 3-dimensional radiation distributions overlaid onto the planning CT.  The [facility] for the target structures as well as the organs at risk were reviewed. The documentation of this is filed in the radiation oncology EMR.  SIMULATION VERIFICATION:  The patient underwent CT imaging on the treatment unit.  These were carefully aligned to document that the ablative radiation dose would cover the target volume and maximally spare the nearby organs at risk according to the planned distribution.  SPECIAL TREATMENT PROCEDURE: Renella Cunas Kinn received high dose ablative stereotactic body radiotherapy to the planned target volume without unforeseen complications. Treatment was delivered uneventfully. The high doses associated with stereotactic body radiotherapy and the significant potential risks require careful treatment set up and patient monitoring constituting a special treatment procedure   STEREOTACTIC TREATMENT MANAGEMENT:  Following delivery, the patient was evaluated clinically. The patient tolerated treatment without significant acute effects, and was discharged to home in stable condition.    PLAN: Continue treatment as planned.  ________________________________  Blair Promise, PhD, MD   This document serves as a record of services personally performed by Gery Pray, MD. It was created on his behalf by Marlowe Kays, a trained medical scribe. The creation of  this record is based on the scribe's personal observations and the provider's statements to them. This document has been checked and approved by the attending provider.

## 2017-10-24 ENCOUNTER — Ambulatory Visit
Admission: RE | Admit: 2017-10-24 | Discharge: 2017-10-24 | Disposition: A | Discharge status: Home / Self Care | Payer: Medicare Other | Source: Ambulatory Visit | Attending: Radiation Oncology | Admitting: Radiation Oncology

## 2017-10-24 DIAGNOSIS — F17210 Nicotine dependence, cigarettes, uncomplicated: Secondary | ICD-10-CM | POA: Diagnosis not present

## 2017-10-24 DIAGNOSIS — C3432 Malignant neoplasm of lower lobe, left bronchus or lung: Secondary | ICD-10-CM | POA: Diagnosis not present

## 2017-10-24 DIAGNOSIS — M069 Rheumatoid arthritis, unspecified: Secondary | ICD-10-CM | POA: Diagnosis not present

## 2017-10-24 DIAGNOSIS — C3492 Malignant neoplasm of unspecified part of left bronchus or lung: Secondary | ICD-10-CM

## 2017-10-24 DIAGNOSIS — J439 Emphysema, unspecified: Secondary | ICD-10-CM | POA: Diagnosis not present

## 2017-10-24 DIAGNOSIS — Z510 Encounter for antineoplastic radiation therapy: Secondary | ICD-10-CM | POA: Diagnosis not present

## 2017-10-24 DIAGNOSIS — R918 Other nonspecific abnormal finding of lung field: Secondary | ICD-10-CM | POA: Diagnosis not present

## 2017-10-24 NOTE — Progress Notes (Signed)
  Radiation Oncology         [phone]) [phone] ________________________________  Name: Kenneth [name] MRN: [id number]  Date: 10/24/2017  DOB: 03-04-1946  Stereotactic Body Radiotherapy Treatment Procedure Note  NARRATIVE:  Kenneth [name] was brought to the stereotactic radiation treatment machine and placed supine on the CT couch. The patient was set up for stereotactic body radiotherapy on the body fix pillow.  3D TREATMENT PLANNING AND DOSIMETRY:  The patient's radiation plan was reviewed and approved prior to starting treatment.  It showed 3-dimensional radiation distributions overlaid onto the planning CT.  The [facility] for the target structures as well as the organs at risk were reviewed. The documentation of this is filed in the radiation oncology EMR.  SIMULATION VERIFICATION:  The patient underwent CT imaging on the treatment unit.  These were carefully aligned to document that the ablative radiation dose would cover the target volume and maximally spare the nearby organs at risk according to the planned distribution.  SPECIAL TREATMENT PROCEDURE: Kenneth [name] received high dose ablative stereotactic body radiotherapy to the planned target volume without unforeseen complications. Treatment was delivered uneventfully. The high doses associated with stereotactic body radiotherapy and the significant potential risks require careful treatment set up and patient monitoring constituting a special treatment procedure   STEREOTACTIC TREATMENT MANAGEMENT:  Following delivery, the patient was evaluated clinically. The patient tolerated treatment without significant acute effects, and was discharged to home in stable condition.    PLAN: Continue treatment as planned.  ________________________________  Blair Promise, PhD, MD   This document serves as a record of services personally performed by Gery Pray MD. It was created on his behalf by Delton Coombes, a trained medical scribe. The creation of this  record is based on the scribe's personal observations and the provider's statements to them.

## 2017-10-27 ENCOUNTER — Encounter: Payer: Self-pay | Admitting: Radiation Oncology

## 2017-10-27 ENCOUNTER — Ambulatory Visit: Payer: Medicare Other

## 2017-10-27 NOTE — Progress Notes (Signed)
  Radiation Oncology         [phone] ________________________________  Name: Kenneth [name] MRN: [id number]  Date: 10/27/2017  DOB: 1946-09-30  End of Treatment Note  Diagnosis:   Clinical Stage IA non-small cell lung cancer (adenocarcinoma)     Indication for treatment:  Curative       Radiation treatment dates:   10/17/17 - 10/24/17  Site/dose:   Left Lung treated to 54 Gy with 3 fx of 18 Gy  Beams/energy:   SBRT/SRT-3D // 6X-FFF  Narrative: The patient tolerated radiation treatment relatively well.   He did complain of mild fatigue and coughing producing clear to cloudy phlegm. He also endorsed an appetite that "comes and goes."  Plan: The patient has completed radiation treatment. The patient will return to radiation oncology clinic for routine followup in one month. I advised them to call or return sooner if they have any questions or concerns related to their recovery or treatment.  -----------------------------------  Blair Promise, PhD, MD  This document serves as a record of services personally performed by Gery Pray, MD. It was created on his behalf by Linward Natal, a trained medical scribe. The creation of this record is based on the scribe's personal observations and the provider's statements to them. This document has been checked and approved by the attending provider.

## 2017-10-31 ENCOUNTER — Ambulatory Visit: Payer: Medicare Other

## 2017-11-03 ENCOUNTER — Ambulatory Visit: Payer: Medicare Other

## 2017-11-04 DIAGNOSIS — Z23 Encounter for immunization: Secondary | ICD-10-CM | POA: Diagnosis not present

## 2017-11-07 ENCOUNTER — Ambulatory Visit: Payer: Medicare Other

## 2017-11-10 ENCOUNTER — Ambulatory Visit: Payer: Medicare Other

## 2017-11-14 ENCOUNTER — Ambulatory Visit: Payer: Medicare Other

## 2017-11-17 ENCOUNTER — Ambulatory Visit: Payer: Medicare Other

## 2017-11-18 DIAGNOSIS — C3432 Malignant neoplasm of lower lobe, left bronchus or lung: Secondary | ICD-10-CM | POA: Diagnosis not present

## 2017-11-18 DIAGNOSIS — J181 Lobar pneumonia, unspecified organism: Secondary | ICD-10-CM | POA: Diagnosis not present

## 2017-11-18 DIAGNOSIS — R05 Cough: Secondary | ICD-10-CM | POA: Diagnosis not present

## 2017-11-22 ENCOUNTER — Encounter: Payer: Self-pay | Admitting: Oncology

## 2017-11-24 ENCOUNTER — Other Ambulatory Visit: Payer: Self-pay

## 2017-11-24 ENCOUNTER — Other Ambulatory Visit: Payer: Self-pay | Admitting: Radiation Oncology

## 2017-11-24 ENCOUNTER — Ambulatory Visit
Admission: RE | Admit: 2017-11-24 | Discharge: 2017-11-24 | Disposition: A | Discharge status: Home / Self Care | Payer: Medicare Other | Source: Ambulatory Visit | Attending: Radiation Oncology | Admitting: Radiation Oncology

## 2017-11-24 ENCOUNTER — Encounter: Payer: Self-pay | Admitting: Radiation Oncology

## 2017-11-24 ENCOUNTER — Ambulatory Visit (HOSPITAL_COMMUNITY)
Admission: RE | Admit: 2017-11-24 | Discharge: 2017-11-24 | Disposition: A | Discharge status: Home / Self Care | Payer: Medicare Other | Source: Ambulatory Visit | Attending: Radiation Oncology | Admitting: Radiation Oncology

## 2017-11-24 ENCOUNTER — Telehealth: Payer: Self-pay | Admitting: Oncology

## 2017-11-24 VITALS — BP 126/78 | HR 86 | Temp 100.2°F | Ht 70.0 in | Wt 140.4 lb

## 2017-11-24 DIAGNOSIS — R079 Chest pain, unspecified: Secondary | ICD-10-CM | POA: Insufficient documentation

## 2017-11-24 DIAGNOSIS — I2510 Atherosclerotic heart disease of native coronary artery without angina pectoris: Secondary | ICD-10-CM | POA: Diagnosis not present

## 2017-11-24 DIAGNOSIS — J439 Emphysema, unspecified: Secondary | ICD-10-CM | POA: Insufficient documentation

## 2017-11-24 DIAGNOSIS — R509 Fever, unspecified: Secondary | ICD-10-CM | POA: Diagnosis not present

## 2017-11-24 DIAGNOSIS — Z79899 Other long term (current) drug therapy: Secondary | ICD-10-CM | POA: Insufficient documentation

## 2017-11-24 DIAGNOSIS — R05 Cough: Secondary | ICD-10-CM | POA: Diagnosis not present

## 2017-11-24 DIAGNOSIS — I700 Atherosclerosis of aorta: Secondary | ICD-10-CM | POA: Insufficient documentation

## 2017-11-24 DIAGNOSIS — C3490 Malignant neoplasm of unspecified part of unspecified bronchus or lung: Secondary | ICD-10-CM | POA: Diagnosis not present

## 2017-11-24 DIAGNOSIS — C3432 Malignant neoplasm of lower lobe, left bronchus or lung: Secondary | ICD-10-CM | POA: Diagnosis not present

## 2017-11-24 DIAGNOSIS — C3492 Malignant neoplasm of unspecified part of left bronchus or lung: Secondary | ICD-10-CM

## 2017-11-24 LAB — CBC WITH DIFFERENTIAL (CANCER CENTER ONLY)
BASOS ABS: 0 10*3/uL (ref 0.0–0.1)
Basophils Relative: 0 %
EOS ABS: 0 10*3/uL (ref 0.0–0.5)
EOS PCT: 0 %
HCT: 45.1 % (ref 38.4–49.9)
Hemoglobin: 15.7 g/dL (ref 13.0–17.1)
LYMPHS PCT: 12 %
Lymphs Abs: 0.6 10*3/uL — ABNORMAL LOW (ref 0.9–3.3)
MCH: 38.8 pg — ABNORMAL HIGH (ref 27.2–33.4)
MCHC: 34.8 g/dL (ref 32.0–36.0)
MCV: 111.4 fL — ABNORMAL HIGH (ref 79.3–98.0)
Monocytes Absolute: 0.4 10*3/uL (ref 0.1–0.9)
Monocytes Relative: 9 %
Neutro Abs: 3.8 10*3/uL (ref 1.5–6.5)
Neutrophils Relative %: 79 %
PLATELETS: 79 10*3/uL — AB (ref 140–400)
RBC: 4.05 MIL/uL — AB (ref 4.20–5.82)
RDW: 14.2 % (ref 11.0–14.6)
WBC: 4.8 10*3/uL (ref 4.0–10.3)
nRBC: 0 /100 WBC

## 2017-11-24 LAB — BASIC METABOLIC PANEL - CANCER CENTER ONLY
Anion gap: 12 — ABNORMAL HIGH (ref 3–11)
BUN: 19 mg/dL (ref 7–26)
CO2: 25 mmol/L (ref 22–29)
CREATININE: 1.37 mg/dL — AB (ref 0.70–1.30)
Calcium: 9.1 mg/dL (ref 8.4–10.4)
Chloride: 99 mmol/L (ref 98–109)
GFR, EST AFRICAN AMERICAN: 59 mL/min — AB (ref >60)
GFR, Estimated: 51 mL/min — ABNORMAL LOW (ref >60)
Glucose, Bld: 88 mg/dL (ref 70–140)
POTASSIUM: 4.4 mmol/L (ref 3.5–5.1)
Sodium: 136 mmol/L (ref 136–145)

## 2017-11-24 NOTE — Progress Notes (Signed)
Kenneth [name] is here for follow up.  He reports having some pain across his chest that he said feels like bronchitis.  He was diagnosed with pneumonia on Friday by Dr. Silvestre Mesi PA.  He was given Levaquin and prednisone and started to feel worse yesterday.  He said his cough is worse and is productive at times.  He said he has clear to milky sputum.  He is taking Tussin DM which is not helping. He denies having hemoptysis.  He said he is wheezing a lot.  He is also feeling very weak and is not eating.  He is trying to drink Gatorade, Coke, Sprite and Premium Protein drinks.  He also is concerned that he may have thrush.  He does have white patches on his tongue.  Orthostatic vitals taken: bp sitting 110/83, hr 71, bp standing 103/66, hr 106.    BP 103/66 (BP Location: Right Arm, Patient Position: Standing)   Pulse (!) 106   Temp 99 F (37.2 C) (Oral)   Ht 5\' 10"  (1.778 m)   Wt 140 lb 6.4 oz (63.7 kg)   SpO2 94%   BMI 20.15 kg/m    Wt Readings from Last 3 Encounters:  11/24/17 140 lb 6.4 oz (63.7 kg)  09/12/17 145 lb (65.8 kg)  09/05/17 145 lb (65.8 kg)

## 2017-11-24 NOTE — Telephone Encounter (Signed)
Called Dr. Silvestre Mesi office and requested the results of his chest x-ray from 11/18/17.  They said they will look for them and send them as soon as possible.

## 2017-11-24 NOTE — Telephone Encounter (Signed)
Called in refill for prednisone per Dr. Sondra Come to [facility].

## 2017-11-24 NOTE — Progress Notes (Addendum)
Radiation Oncology         [phone]) [phone] ________________________________  Name: Kenneth [name] MRN: [id number]  Date: 11/24/2017  DOB: September 22, 1946  Follow-Up Visit Note  CC: [name], MD  Melrose Nakayama, *    ICD-10-CM   1. Non-small cell cancer of left lung ([place]) C34.92 CBC with Differential ([place] Only)    [place] Only    Diagnosis:   Clinical Stage IA non-small cell lung cancer (adenocarcinoma)  Interval Since Last Radiation:  1 month  Radiation treatment dates:   10/17/17 - 10/24/17  Site/dose:   Left Lung treated to 54 Gy with 3 fx of 18 Gy (SBRT)    Narrative: The patient returns today for routine follow-up. Pt reports that he was diagnosed with pneumonia last Friday by Dr.Perini's PA and he underwent an x-ray at that visit. Pt states that the he has had a fever since last night. Pt notes that for approximately one week everything he has eaten has tasted bad. Pt reports that the he was tested for the flu and the results were negative. Pt states that he his having some pain across his chest. Pt presents with a cough, which he states has worsen and is productive at times.  Feels worse than when he started antibiotic therapy                         ALLERGIES:  is allergic to other; aspirin; and naproxen sodium.  Meds: Current Outpatient Medications  Medication Sig Dispense Refill  . amLODipine (NORVASC) 5 MG tablet Take 1 tablet daily by mouth.  3  . apixaban (ELIQUIS) 2.5 MG TABS tablet Take 2.5 mg 2 (two) times daily by mouth.    . budesonide-formoterol (SYMBICORT) 80-4.5 MCG/ACT inhaler Inhale 2 puffs into the lungs 2 (two) times daily.    . cholecalciferol (VITAMIN D) 1000 UNITS tablet Take 1,000 Units by mouth daily.      . clonazePAM (KLONOPIN) 0.5 MG tablet Take 0.5 mg at bedtime by mouth.     . Dextromethorphan-Guaifenesin (TUSSIN DM PO) Take 1 tablet by mouth every 6 (six) hours as needed (cough and cold).     .  fluticasone (FLONASE) 50 MCG/ACT nasal spray Place 2 sprays into the nose daily.      . folic acid (FOLVITE) 1 MG tablet Take 1 mg by mouth daily.      Marland Kitchen guaiFENesin (ROBITUSSIN) 100 MG/5ML SOLN Take 5 mLs by mouth every 4 (four) hours as needed for cough or to loosen phlegm.    Marland Kitchen HYDROcodone-acetaminophen (NORCO/VICODIN) 5-325 MG tablet Take 1 tablet every 6 (six) hours as needed by mouth for moderate pain.    . methotrexate (RHEUMATREX) 2.5 MG tablet Take 7.5 mg by mouth 2 (two) times a week. Take 3 tabs every Friday 3 tabs on Sunday    . pantoprazole (PROTONIX) 40 MG tablet Take 1 tablet (40 mg total) by mouth daily. 30 tablet 0  . rosuvastatin (CRESTOR) 20 MG tablet Take 10 mg every evening by mouth.     . Tiotropium Bromide Monohydrate (SPIRIVA RESPIMAT) 1.25 MCG/ACT AERS Inhale 2 puffs into the lungs daily.    . valACYclovir (VALTREX) 500 MG tablet Take 500 mg 2 (two) times daily by mouth.    . zoledronic acid (RECLAST) 5 MG/100ML SOLN Inject 5 mg into the vein once. Every 2 years     . acetaminophen (TYLENOL) 325 MG tablet Take 325  mg by mouth every 4 (four) hours as needed for mild pain.     Marland Kitchen levofloxacin (LEVAQUIN) 500 MG tablet TK 1 T PO QD FOR 7 DAYS  0  . nystatin (MYCOSTATIN) 100000 UNIT/ML suspension Take 5 mLs (500,000 Units total) by mouth 4 (four) times daily. (Patient not taking: Reported on 11/24/2017) 160 mL 0  . predniSONE (DELTASONE) 20 MG tablet TK 2 TS PO QD WITH FOOD FOR 10 DAYS  0   No current facility-administered medications for this encounter.     Physical Findings: The patient is in no acute distress. Patient is alert and oriented.  height is 5\' 10"  (1.778 m) and weight is 140 lb 6.4 oz (63.7 kg). His oral temperature is 100.2 F (37.9 C). His blood pressure is 126/78 and his pulse is 86. His oxygen saturation is 94%. .   Lungs are clear to auscultation bilaterally. Heart has regular rate and rhythm. No palpable cervical, supraclavicular, or axillary adenopathy.  Abdomen soft, non-tender, normal bowel sounds. Oral cavity moist. No secondary infection.  Lab Findings: Lab Results  Component Value Date   WBC 4.8 11/24/2017   HGB 15.0 09/01/2017   HCT 45.1 11/24/2017   MCV 111.4 (H) 11/24/2017   PLT 79 (L) 11/24/2017    Radiographic Findings: Ct Chest Wo Contrast  Result Date: 11/24/2017 CLINICAL DATA:  Follow-up lung cancer. Recent history of pneumonia. She fever and shortness of breath. EXAM: CT CHEST WITHOUT CONTRAST TECHNIQUE: Multidetector CT imaging of the chest was performed following the standard protocol without IV contrast. COMPARISON:  07/28/2017 FINDINGS: Cardiovascular: The heart size appears within normal limits. No pericardial effusion identified. Aortic atherosclerosis. Calcification in the RCA and LAD coronary arteries noted. Mediastinum/Nodes: The trachea is patent and midline. Normal appearance of the esophagus. Normal appearance of the thyroid gland. No enlarged mediastinal or hilar lymph nodes. Lungs/Pleura: No pleural effusion. There are advanced changes of emphysema identified throughout both lungs. Diffuse bronchial wall thickening noted. The index nodule within the left lower lobe measures 1.2 x 0.9 x 5 cm (volume = 3 cm^3), image 96 of series 5. Previously 0.9 by 0.9 by 0.6 cm (volume = 3 cm^3). Index perifissural spiculated nodule within the posterior left upper lobe measures 9 mm and is stable from previous exam, image 38 of series 5. Left upper lobe nodule measuring 5 mm is unchanged, image 29 of series 77. Biapical pleuroparenchymal scarring is similar to previous exam. No airspace consolidation identified to suggest pneumonia. Upper Abdomen: Calcified granulomas identified within the liver. Gallbladder sludge suspected no acute abnormality within the upper abdomen. Musculoskeletal: No aggressive lytic or sclerotic bone lesions. IMPRESSION: 1. Stable pulmonary nodules within the left lung. No new or progressive disease identified. 2.  Diffuse bronchial wall thickening with emphysema, as above; imaging findings suggestive of underlying COPD. Emphysema (ICD10-J43.9). 3. Aortic atherosclerosis and 2 vessel coronary artery atherosclerotic calcifications. Aortic Atherosclerosis (ICD10-I70.0). Electronically Signed   By: Kerby Moors M.D.   On: 11/24/2017 13:48    Impression:  Clinical stage I non-small cell lung cancer, status post SBRT. The patient presented today for routine follow-up of his radiation treatment but as above was diagnosed with probable pneumonia on chest x-ray. He has not had improvement despite Levaquin antibiotic therapy. Patient will require inpatient admission for IV antibiotic therapy. Blood work today is not consistent with overwhelming bacterial pneumonia  Plan:  Patient underwent an urgent CT scan of the chest which surprisingly did not show any signs of pneumonia, radiation pneumonitis or  other acute issue. Patient was noted to have diffuse bronchial wall thickening consistent with emphysema. Discussed the potential presentation to the emergency room but this point in time the patient would like to go home and rest and will return if his overall clinical picture worsens. He will try to force fluids. I have restarted the patient on prednisone in the event that some of his coughing and respiratory symptoms related to early radiation pneumonitis. Follow-up next week.  -----------------------------------  Blair Promise, PhD, MD  This document serves as a record of services personally performed by Gery Pray, MD. It was created on his behalf by Valeta Harms, a trained medical scribe. The creation of this record is based on the scribe's personal observations and the provider's statements to them. This document has been checked and approved by the attending provider.

## 2017-11-29 ENCOUNTER — Encounter: Payer: Self-pay | Admitting: Radiation Oncology

## 2017-11-29 ENCOUNTER — Ambulatory Visit
Admission: RE | Admit: 2017-11-29 | Discharge: 2017-11-29 | Disposition: A | Discharge status: Home / Self Care | Payer: Medicare Other | Source: Ambulatory Visit | Attending: Radiation Oncology | Admitting: Radiation Oncology

## 2017-11-29 ENCOUNTER — Other Ambulatory Visit: Payer: Self-pay

## 2017-11-29 DIAGNOSIS — J439 Emphysema, unspecified: Secondary | ICD-10-CM | POA: Diagnosis not present

## 2017-11-29 DIAGNOSIS — Z923 Personal history of irradiation: Secondary | ICD-10-CM | POA: Insufficient documentation

## 2017-11-29 DIAGNOSIS — Z79899 Other long term (current) drug therapy: Secondary | ICD-10-CM | POA: Insufficient documentation

## 2017-11-29 DIAGNOSIS — C3492 Malignant neoplasm of unspecified part of left bronchus or lung: Secondary | ICD-10-CM

## 2017-11-29 DIAGNOSIS — I700 Atherosclerosis of aorta: Secondary | ICD-10-CM | POA: Insufficient documentation

## 2017-11-29 DIAGNOSIS — C3432 Malignant neoplasm of lower lobe, left bronchus or lung: Secondary | ICD-10-CM | POA: Insufficient documentation

## 2017-11-29 DIAGNOSIS — Z8701 Personal history of pneumonia (recurrent): Secondary | ICD-10-CM | POA: Diagnosis not present

## 2017-11-29 NOTE — Progress Notes (Signed)
Kenneth [name] is here for follow up.  He denies having pain except for some back pain which he thinks is from arthritis.  He denies having shortness of breath.  His cough is better with clear/milky sputum.  He is taking robitussin cough syrup twice a day.  He is taking prednisone and has 3-4 days left.  He reports he is feeling better and has more energy..    BP (!) 150/64 (BP Location: Right Arm, Patient Position: Sitting)   Pulse 68   Temp 97.8 F (36.6 C) (Oral)   Ht 5\' 10"  (1.778 m)   Wt 145 lb 9.6 oz (66 kg)   SpO2 98%   BMI 20.89 kg/m    Wt Readings from Last 3 Encounters:  11/29/17 145 lb 9.6 oz (66 kg)  11/24/17 140 lb 6.4 oz (63.7 kg)  09/12/17 145 lb (65.8 kg)

## 2017-11-29 NOTE — Progress Notes (Signed)
Radiation Oncology         [phone] ________________________________  Name: Kenneth [name] MRN: [id number]  Date: 11/29/2017  DOB: [birth year]  Follow-Up Visit Note  CC: [name], MD  Melrose Nakayama, *    ICD-10-CM   1. Non-small cell cancer of left lung [facility]) C34.92     Diagnosis:  ClinicalStage IA non-small cell lung cancer (adenocarcinoma)    Interval Since Last Radiation:  5 weeks  Narrative:  The patient returns today for close follow-up. Patient was feeling poorly on his last follow-up was diagnosed with pneumonia. He did undergo a chest CT scan which showed no obvious pneumonia or radiation pneumonitis. Patient is feeling much better today and has more energy. He denies any breathing problems.                             ALLERGIES:  is allergic to other; aspirin; and naproxen sodium.  Meds: Current Outpatient Medications  Medication Sig Dispense Refill  . acetaminophen (TYLENOL) 325 MG tablet Take 325 mg by mouth every 4 (four) hours as needed for mild pain.     Marland Kitchen amLODipine (NORVASC) 5 MG tablet Take 1 tablet daily by mouth.  3  . apixaban (ELIQUIS) 2.5 MG TABS tablet Take 2.5 mg 2 (two) times daily by mouth.    . budesonide-formoterol (SYMBICORT) 80-4.5 MCG/ACT inhaler Inhale 2 puffs into the lungs 2 (two) times daily.    . cholecalciferol (VITAMIN D) 1000 UNITS tablet Take 1,000 Units by mouth daily.      . clonazePAM (KLONOPIN) 0.5 MG tablet Take 0.5 mg at bedtime by mouth.     . Dextromethorphan-Guaifenesin (TUSSIN DM PO) Take 1 tablet by mouth every 6 (six) hours as needed (cough and cold).     . fluticasone (FLONASE) 50 MCG/ACT nasal spray Place 2 sprays into the nose daily.      . folic acid (FOLVITE) 1 MG tablet Take 1 mg by mouth daily.      Marland Kitchen guaiFENesin (ROBITUSSIN) 100 MG/5ML SOLN Take 5 mLs by mouth every 4 (four) hours as needed for cough or to loosen phlegm.    Marland Kitchen HYDROcodone-acetaminophen (NORCO/VICODIN) 5-325 MG tablet Take 1 tablet every 6  (six) hours as needed by mouth for moderate pain.    . methotrexate (RHEUMATREX) 2.5 MG tablet Take 7.5 mg by mouth 2 (two) times a week. Take 3 tabs every Friday 3 tabs on Sunday    . pantoprazole (PROTONIX) 40 MG tablet Take 1 tablet (40 mg total) by mouth daily. 30 tablet 0  . predniSONE (DELTASONE) 20 MG tablet TK 2 TS PO QD WITH FOOD FOR 10 DAYS  0  . rosuvastatin (CRESTOR) 20 MG tablet Take 10 mg every evening by mouth.     . Tiotropium Bromide Monohydrate (SPIRIVA RESPIMAT) 1.25 MCG/ACT AERS Inhale 2 puffs into the lungs daily.    . valACYclovir (VALTREX) 500 MG tablet Take 500 mg 2 (two) times daily by mouth.    . zoledronic acid (RECLAST) 5 MG/100ML SOLN Inject 5 mg into the vein once. Every 2 years     . levofloxacin (LEVAQUIN) 500 MG tablet TK 1 T PO QD FOR 7 DAYS  0  . nystatin (MYCOSTATIN) 100000 UNIT/ML suspension Take 5 mLs (500,000 Units total) by mouth 4 (four) times daily. (Patient not taking: Reported on 11/24/2017) 160 mL 0   No current facility-administered medications for this encounter.     Physical  Findings: The patient is in no acute distress. Patient is alert and oriented.  height is 5\' 10"  (1.778 m) and weight is 145 lb 9.6 oz (66 kg). His oral temperature is 97.8 F (36.6 C). His blood pressure is 150/64 (abnormal) and his pulse is 68. His oxygen saturation is 98%. .  Lungs are clear to auscultation bilaterally. Heart has regular rate and rhythm. No palpable cervical, supraclavicular, or axillary adenopathy. Abdomen soft, non-tender, normal bowel sounds.  Lab Findings: Lab Results  Component Value Date   WBC 4.8 11/24/2017   HGB 15.0 09/01/2017   HCT 45.1 11/24/2017   MCV 111.4 (H) 11/24/2017   PLT 79 (L) 11/24/2017    Radiographic Findings: Ct Chest Wo Contrast  Result Date: 11/24/2017 CLINICAL DATA:  Follow-up lung cancer. Recent history of pneumonia. She fever and shortness of breath. EXAM: CT CHEST WITHOUT CONTRAST TECHNIQUE: Multidetector CT imaging of  the chest was performed following the standard protocol without IV contrast. COMPARISON:  07/28/2017 FINDINGS: Cardiovascular: The heart size appears within normal limits. No pericardial effusion identified. Aortic atherosclerosis. Calcification in the RCA and LAD coronary arteries noted. Mediastinum/Nodes: The trachea is patent and midline. Normal appearance of the esophagus. Normal appearance of the thyroid gland. No enlarged mediastinal or hilar lymph nodes. Lungs/Pleura: No pleural effusion. There are advanced changes of emphysema identified throughout both lungs. Diffuse bronchial wall thickening noted. The index nodule within the left lower lobe measures 1.2 x 0.9 x 5 cm (volume = 3 cm^3), image 96 of series 5. Previously 0.9 by 0.9 by 0.6 cm (volume = 3 cm^3). Index perifissural spiculated nodule within the posterior left upper lobe measures 9 mm and is stable from previous exam, image 38 of series 5. Left upper lobe nodule measuring 5 mm is unchanged, image 29 of series 77. Biapical pleuroparenchymal scarring is similar to previous exam. No airspace consolidation identified to suggest pneumonia. Upper Abdomen: Calcified granulomas identified within the liver. Gallbladder sludge suspected no acute abnormality within the upper abdomen. Musculoskeletal: No aggressive lytic or sclerotic bone lesions. IMPRESSION: 1. Stable pulmonary nodules within the left lung. No new or progressive disease identified. 2. Diffuse bronchial wall thickening with emphysema, as above; imaging findings suggestive of underlying COPD. Emphysema (ICD10-J43.9). 3. Aortic atherosclerosis and 2 vessel coronary artery atherosclerotic calcifications. Aortic Atherosclerosis (ICD10-I70.0). Electronically Signed   By: Kerby Moors M.D.   On: 11/24/2017 13:48    Impression:  Clinical stage IA non-small cell lung cancer status post stereotactic body radiation therapy. As above the patient is feeling much better.  Plan:  Patient will return  for routine follow-up in 3 months. After that appointment the patient will be scheduled for  another chest CT scan.  ____________________________________ Gery Pray, MD

## 2017-12-27 ENCOUNTER — Ambulatory Visit: Payer: Self-pay | Admitting: Radiation Oncology

## 2018-01-17 DIAGNOSIS — Z6820 Body mass index (BMI) 20.0-20.9, adult: Secondary | ICD-10-CM | POA: Diagnosis not present

## 2018-01-17 DIAGNOSIS — H6120 Impacted cerumen, unspecified ear: Secondary | ICD-10-CM | POA: Diagnosis not present

## 2018-01-19 DIAGNOSIS — B019 Varicella without complication: Secondary | ICD-10-CM | POA: Diagnosis not present

## 2018-01-19 DIAGNOSIS — M150 Primary generalized (osteo)arthritis: Secondary | ICD-10-CM | POA: Diagnosis not present

## 2018-01-19 DIAGNOSIS — M5030 Other cervical disc degeneration, unspecified cervical region: Secondary | ICD-10-CM | POA: Diagnosis not present

## 2018-01-19 DIAGNOSIS — M0579 Rheumatoid arthritis with rheumatoid factor of multiple sites without organ or systems involvement: Secondary | ICD-10-CM | POA: Diagnosis not present

## 2018-01-19 DIAGNOSIS — Z6821 Body mass index (BMI) 21.0-21.9, adult: Secondary | ICD-10-CM | POA: Diagnosis not present

## 2018-01-19 DIAGNOSIS — R5382 Chronic fatigue, unspecified: Secondary | ICD-10-CM | POA: Diagnosis not present

## 2018-01-19 DIAGNOSIS — M2550 Pain in unspecified joint: Secondary | ICD-10-CM | POA: Diagnosis not present

## 2018-03-02 ENCOUNTER — Telehealth: Payer: Self-pay | Admitting: *Deleted

## 2018-03-02 ENCOUNTER — Encounter: Payer: Self-pay | Admitting: Radiation Oncology

## 2018-03-02 ENCOUNTER — Ambulatory Visit
Admission: RE | Admit: 2018-03-02 | Discharge: 2018-03-02 | Disposition: A | Discharge status: Home / Self Care | Payer: Medicare Other | Source: Ambulatory Visit | Attending: Radiation Oncology | Admitting: Radiation Oncology

## 2018-03-02 ENCOUNTER — Other Ambulatory Visit: Payer: Self-pay

## 2018-03-02 VITALS — BP 153/72 | HR 66 | Temp 98.1°F | Resp 20 | Ht 70.0 in | Wt 148.6 lb

## 2018-03-02 DIAGNOSIS — Z7901 Long term (current) use of anticoagulants: Secondary | ICD-10-CM | POA: Insufficient documentation

## 2018-03-02 DIAGNOSIS — D529 Folate deficiency anemia, unspecified: Secondary | ICD-10-CM

## 2018-03-02 DIAGNOSIS — C3432 Malignant neoplasm of lower lobe, left bronchus or lung: Secondary | ICD-10-CM | POA: Insufficient documentation

## 2018-03-02 DIAGNOSIS — R0989 Other specified symptoms and signs involving the circulatory and respiratory systems: Secondary | ICD-10-CM | POA: Diagnosis not present

## 2018-03-02 DIAGNOSIS — Z87891 Personal history of nicotine dependence: Secondary | ICD-10-CM | POA: Insufficient documentation

## 2018-03-02 DIAGNOSIS — R0602 Shortness of breath: Secondary | ICD-10-CM | POA: Diagnosis not present

## 2018-03-02 DIAGNOSIS — Z923 Personal history of irradiation: Secondary | ICD-10-CM | POA: Insufficient documentation

## 2018-03-02 DIAGNOSIS — C3492 Malignant neoplasm of unspecified part of left bronchus or lung: Secondary | ICD-10-CM

## 2018-03-02 DIAGNOSIS — Z08 Encounter for follow-up examination after completed treatment for malignant neoplasm: Secondary | ICD-10-CM | POA: Diagnosis not present

## 2018-03-02 DIAGNOSIS — Z85118 Personal history of other malignant neoplasm of bronchus and lung: Secondary | ICD-10-CM | POA: Diagnosis not present

## 2018-03-02 DIAGNOSIS — Z79899 Other long term (current) drug therapy: Secondary | ICD-10-CM | POA: Diagnosis not present

## 2018-03-02 LAB — CBC WITH DIFFERENTIAL (CANCER CENTER ONLY)
Basophils Absolute: 0 10*3/uL (ref 0.0–0.1)
Basophils Relative: 0 %
EOS ABS: 0.1 10*3/uL (ref 0.0–0.5)
Eosinophils Relative: 2 %
HEMATOCRIT: 38.8 % (ref 38.4–49.9)
HEMOGLOBIN: 13.2 g/dL (ref 13.0–17.1)
LYMPHS ABS: 1.3 10*3/uL (ref 0.9–3.3)
Lymphocytes Relative: 19 %
MCH: 37.6 pg — AB (ref 27.2–33.4)
MCHC: 34 g/dL (ref 32.0–36.0)
MCV: 110.5 fL — ABNORMAL HIGH (ref 79.3–98.0)
Monocytes Absolute: 0.7 10*3/uL (ref 0.1–0.9)
Monocytes Relative: 11 %
NEUTROS ABS: 4.5 10*3/uL (ref 1.5–6.5)
NEUTROS PCT: 68 %
Platelet Count: 136 10*3/uL — ABNORMAL LOW (ref 140–400)
RBC: 3.51 MIL/uL — AB (ref 4.20–5.82)
RDW: 14.2 % (ref 11.0–14.6)
WBC: 6.6 10*3/uL (ref 4.0–10.3)

## 2018-03-02 LAB — BASIC METABOLIC PANEL - CANCER CENTER ONLY
Anion gap: 7 (ref 3–11)
BUN: 10 mg/dL (ref 7–26)
CHLORIDE: 108 mmol/L (ref 98–109)
CO2: 26 mmol/L (ref 22–29)
CREATININE: 1.19 mg/dL (ref 0.70–1.30)
Calcium: 9.2 mg/dL (ref 8.4–10.4)
GFR, Est AFR Am: >60 mL/min (ref >60)
GFR, Estimated: >60 mL/min (ref >60)
GLUCOSE: 87 mg/dL (ref 70–140)
POTASSIUM: 3.9 mmol/L (ref 3.5–5.1)
SODIUM: 141 mmol/L (ref 136–145)

## 2018-03-02 NOTE — Progress Notes (Signed)
Pt here for a follow-up visit for left lung cancer. Pt states that he is having arthritis pain that is a 6 out of 10. Pt takes hydrocodone for the pain. Pt states that he has mild fatigue and takes naps to help. Pt states that he is not coughing as much any more. Pt states that is cough is not productive. Pt denies having hemoptysis. Pt states that he has mild shortness of breath on exertion. Pt denies having any painful or difficulty swallowing. Pt denies having any skin irritation. Pt states that he is not on chemotherapy treatments.  BP (!) 153/72 (BP Location: Left Arm, Patient Position: Sitting, Cuff Size: Normal)   Pulse 66   Temp 98.1 F (36.7 C) (Oral)   Resp 20   Ht 5\' 10"  (1.778 m)   Wt 148 lb 9.6 oz (67.4 kg)   SpO2 95%   BMI 21.32 kg/m   BP (!) 153/72 (BP Location: Left Arm, Patient Position: Sitting, Cuff Size: Normal)   Pulse 66   Temp 98.1 F (36.7 C) (Oral)   Resp 20   Ht 5\' 10"  (1.778 m)   Wt 148 lb 9.6 oz (67.4 kg)   SpO2 95%   BMI 21.32 kg/m

## 2018-03-02 NOTE — Progress Notes (Signed)
Radiation Oncology         [phone] ________________________________  Name: Kenneth [name] MRN: [id number]  Date: 03/02/2018  DOB: 18-Jul-1946  Follow-Up Visit Note  CC: [name], MD  Melrose Nakayama, *    ICD-10-CM   1. 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  Narrative:  Pt here for a follow-up visit for left lung cancer. Pt states that he is having arthritis pain that is a 6 out of 10. Pt takes hydrocodone for the pain. Pt states that he has mild fatigue and takes naps to help. Pt states that he is not coughing as much any more. Pt states that is cough is not productive. Pt denies having hemoptysis. Pt states that he has mild shortness of breath on exertion. Pt denies having any painful or difficulty swallowing. Pt denies having any skin irritation. Pt states that he is not on chemotherapy treatments. Patient reported that he has stopped smoking since February 18th, 2018. Denies having any chest pain or coughing out blood.  ALLERGIES:  is allergic to other; aspirin; and naproxen sodium.  Meds: Current Outpatient Medications  Medication Sig Dispense Refill  . acetaminophen (TYLENOL) 325 MG tablet Take 325 mg by mouth every 4 (four) hours as needed for mild pain.     Marland Kitchen amLODipine (NORVASC) 5 MG tablet Take 1 tablet daily by mouth.  3  . apixaban (ELIQUIS) 2.5 MG TABS tablet Take 2.5 mg 2 (two) times daily by mouth.    . budesonide-formoterol (SYMBICORT) 80-4.5 MCG/ACT inhaler Inhale 2 puffs into the lungs 2 (two) times daily.    . cholecalciferol (VITAMIN D) 1000 UNITS tablet Take 1,000 Units by mouth daily.      . clonazePAM (KLONOPIN) 0.5 MG tablet Take 0.5 mg at bedtime by mouth.     . Dextromethorphan-Guaifenesin (TUSSIN DM PO) Take 1 tablet by  mouth every 6 (six) hours as needed (cough and cold).     . fluticasone (FLONASE) 50 MCG/ACT nasal spray Place 2 sprays into the nose daily.      . folic acid (FOLVITE) 1 MG tablet Take 1 mg by mouth daily.      Marland Kitchen guaiFENesin (ROBITUSSIN) 100 MG/5ML SOLN Take 5 mLs by mouth every 4 (four) hours as needed for cough or to loosen phlegm.    Marland Kitchen HYDROcodone-acetaminophen (NORCO/VICODIN) 5-325 MG tablet Take 1 tablet every 6 (six) hours as needed by mouth for moderate pain.    Marland Kitchen levofloxacin (LEVAQUIN) 500 MG tablet TK 1 T PO QD FOR 7 DAYS  0  . methotrexate (RHEUMATREX) 2.5 MG tablet Take 7.5 mg by mouth 2 (two) times a week. Take 4 tabs every Friday 4 tabs on Sunday    . nystatin (MYCOSTATIN) 100000 UNIT/ML suspension Take 5 mLs (500,000 Units total) by mouth 4 (four) times daily. 160 mL 0  . pantoprazole (PROTONIX) 40 MG tablet Take 1 tablet (40 mg total) by mouth daily. 30 tablet 0  . predniSONE (DELTASONE) 20 MG tablet TK 2 TS PO QD WITH FOOD FOR 10 DAYS  0  . rosuvastatin (CRESTOR) 20 MG tablet Take 10 mg every evening by mouth.     . Tiotropium Bromide Monohydrate (SPIRIVA RESPIMAT) 1.25 MCG/ACT AERS Inhale 2 puffs into the  lungs daily.    . valACYclovir (VALTREX) 500 MG tablet Take 500 mg 2 (two) times daily by mouth.    . zoledronic acid (RECLAST) 5 MG/100ML SOLN Inject 5 mg into the vein once. Every 2 years      No current facility-administered medications for this encounter.     Physical Findings: The patient is in no acute distress. Patient is alert and oriented.  height is 5\' 10"  (1.778 m) and weight is 148 lb 9.6 oz (67.4 kg). His oral temperature is 98.1 F (36.7 C). His blood pressure is 153/72 (abnormal) and his pulse is 66. His respiration is 20 and oxygen saturation is 95%. .  No significant changes. Lungs are clear to auscultation bilaterally. Heart has regular rate and rhythm. No palpable cervical, supraclavicular, or axillary adenopathy. Abdomen soft, non-tender, normal bowel  sounds.   Patient has bibasal crackles on lung exam.  Lab Findings: Lab Results  Component Value Date   WBC 4.8 11/24/2017   HGB 15.7 11/24/2017   HCT 45.1 11/24/2017   MCV 111.4 (H) 11/24/2017   PLT 79 (L) 11/24/2017    Radiographic Findings: No results found.  Impression:  No evidence of recurrence on exam today.  Plan: Patient will be scheduled for a Chest CT scan. Patient will return for a routine follow up in 6 months.  -----------------------------------  Blair Promise, PhD, MD  This document serves as a record of services personally performed by Gery Pray MD. It was created on his behalf by Delton Coombes, a trained medical scribe. The creation of this record is based on the scribe's personal observations and the provider's statements to them.

## 2018-03-02 NOTE — Telephone Encounter (Signed)
CALLED PATIENT TO INFORM OF LAB APPT. FOR 03-03-18 @ 2:30 PM, SPOKE WITH PATIENT 'S WIFE- MARY AND SHE IS AWARE OF THIS APPT.

## 2018-03-03 ENCOUNTER — Ambulatory Visit
Admission: RE | Admit: 2018-03-03 | Discharge: 2018-03-03 | Disposition: A | Discharge status: Home / Self Care | Payer: Medicare Other | Source: Ambulatory Visit | Attending: Radiation Oncology | Admitting: Radiation Oncology

## 2018-03-03 DIAGNOSIS — C3432 Malignant neoplasm of lower lobe, left bronchus or lung: Secondary | ICD-10-CM | POA: Diagnosis not present

## 2018-03-03 DIAGNOSIS — D529 Folate deficiency anemia, unspecified: Secondary | ICD-10-CM

## 2018-03-03 LAB — FOLATE: Folate: 66 ng/mL (ref >5.9)

## 2018-03-03 LAB — VITAMIN B12: Vitamin B-12: 329 pg/mL (ref 180–914)

## 2018-03-06 ENCOUNTER — Telehealth: Payer: Self-pay | Admitting: *Deleted

## 2018-03-06 NOTE — Telephone Encounter (Signed)
Called patient to inform of Ct for 03-09-18 - arrival time- 10:15 am @ [facility], pt. To be NPO- 4 hrs. prior to test, spoke with patient's wife - [name] and she is aware of this test

## 2018-03-09 ENCOUNTER — Ambulatory Visit (HOSPITAL_COMMUNITY)
Admission: RE | Admit: 2018-03-09 | Discharge: 2018-03-09 | Disposition: A | Discharge status: Home / Self Care | Payer: Medicare Other | Source: Ambulatory Visit | Attending: Radiation Oncology | Admitting: Radiation Oncology

## 2018-03-09 DIAGNOSIS — C3432 Malignant neoplasm of lower lobe, left bronchus or lung: Secondary | ICD-10-CM

## 2018-03-09 DIAGNOSIS — I700 Atherosclerosis of aorta: Secondary | ICD-10-CM | POA: Insufficient documentation

## 2018-03-09 DIAGNOSIS — I2510 Atherosclerotic heart disease of native coronary artery without angina pectoris: Secondary | ICD-10-CM | POA: Insufficient documentation

## 2018-03-09 DIAGNOSIS — J439 Emphysema, unspecified: Secondary | ICD-10-CM | POA: Insufficient documentation

## 2018-03-09 MED ORDER — IOPAMIDOL (ISOVUE-300) INJECTION 61%
100.0000 mL | Freq: Once | INTRAVENOUS | Status: AC | PRN
  Administered 2018-03-09: 100 mL via INTRAVENOUS

## 2018-03-09 MED ORDER — IOPAMIDOL (ISOVUE-300) INJECTION 61%
INTRAVENOUS | Status: AC
  Filled 2018-03-09: qty 100

## 2018-03-14 ENCOUNTER — Telehealth: Payer: Self-pay

## 2018-03-14 NOTE — Telephone Encounter (Addendum)
Called and spoke to pt's wife (pt was sleeping) regarding recent lab work from 03/03/18 and CT scan on 03/09/18. Per Dr. Sondra Come informed her of good results on both encounters. Told her and the pt to call back should they have any further questions/concerns. Wife verbalized understanding and agreement.

## 2018-03-20 DIAGNOSIS — M5030 Other cervical disc degeneration, unspecified cervical region: Secondary | ICD-10-CM | POA: Diagnosis not present

## 2018-03-20 DIAGNOSIS — M2550 Pain in unspecified joint: Secondary | ICD-10-CM | POA: Diagnosis not present

## 2018-03-20 DIAGNOSIS — B019 Varicella without complication: Secondary | ICD-10-CM | POA: Diagnosis not present

## 2018-03-20 DIAGNOSIS — Z6822 Body mass index (BMI) 22.0-22.9, adult: Secondary | ICD-10-CM | POA: Diagnosis not present

## 2018-03-20 DIAGNOSIS — M0579 Rheumatoid arthritis with rheumatoid factor of multiple sites without organ or systems involvement: Secondary | ICD-10-CM | POA: Diagnosis not present

## 2018-03-20 DIAGNOSIS — M150 Primary generalized (osteo)arthritis: Secondary | ICD-10-CM | POA: Diagnosis not present

## 2018-03-20 DIAGNOSIS — R5382 Chronic fatigue, unspecified: Secondary | ICD-10-CM | POA: Diagnosis not present

## 2018-04-05 DIAGNOSIS — D692 Other nonthrombocytopenic purpura: Secondary | ICD-10-CM | POA: Diagnosis not present

## 2018-04-05 DIAGNOSIS — L905 Scar conditions and fibrosis of skin: Secondary | ICD-10-CM | POA: Diagnosis not present

## 2018-04-05 DIAGNOSIS — L821 Other seborrheic keratosis: Secondary | ICD-10-CM | POA: Diagnosis not present

## 2018-05-17 DIAGNOSIS — M0579 Rheumatoid arthritis with rheumatoid factor of multiple sites without organ or systems involvement: Secondary | ICD-10-CM | POA: Diagnosis not present

## 2018-05-17 DIAGNOSIS — B019 Varicella without complication: Secondary | ICD-10-CM | POA: Diagnosis not present

## 2018-05-17 DIAGNOSIS — M5030 Other cervical disc degeneration, unspecified cervical region: Secondary | ICD-10-CM | POA: Diagnosis not present

## 2018-05-17 DIAGNOSIS — Z6821 Body mass index (BMI) 21.0-21.9, adult: Secondary | ICD-10-CM | POA: Diagnosis not present

## 2018-05-17 DIAGNOSIS — R5382 Chronic fatigue, unspecified: Secondary | ICD-10-CM | POA: Diagnosis not present

## 2018-05-17 DIAGNOSIS — M2550 Pain in unspecified joint: Secondary | ICD-10-CM | POA: Diagnosis not present

## 2018-05-17 DIAGNOSIS — M150 Primary generalized (osteo)arthritis: Secondary | ICD-10-CM | POA: Diagnosis not present

## 2018-06-09 ENCOUNTER — Inpatient Hospital Stay (HOSPITAL_COMMUNITY): Payer: Medicare Other

## 2018-06-09 ENCOUNTER — Other Ambulatory Visit: Payer: Self-pay

## 2018-06-09 ENCOUNTER — Inpatient Hospital Stay (HOSPITAL_COMMUNITY)
Admission: EM | Admit: 2018-06-09 | Discharge: 2018-06-15 | DRG: 809 | Disposition: A | Discharge status: Home / Self Care | Payer: Medicare Other | Attending: Internal Medicine | Admitting: Internal Medicine

## 2018-06-09 ENCOUNTER — Other Ambulatory Visit (HOSPITAL_COMMUNITY): Payer: Self-pay | Admitting: Registered Nurse

## 2018-06-09 ENCOUNTER — Ambulatory Visit (INDEPENDENT_AMBULATORY_CARE_PROVIDER_SITE_OTHER)
Admission: RE | Admit: 2018-06-09 | Discharge: 2018-06-09 | Disposition: A | Discharge status: Home / Self Care | Payer: Medicare Other | Source: Ambulatory Visit | Attending: Vascular Surgery | Admitting: Vascular Surgery

## 2018-06-09 ENCOUNTER — Encounter (HOSPITAL_COMMUNITY): Payer: Self-pay | Admitting: *Deleted

## 2018-06-09 DIAGNOSIS — F17210 Nicotine dependence, cigarettes, uncomplicated: Secondary | ICD-10-CM | POA: Diagnosis present

## 2018-06-09 DIAGNOSIS — M7989 Other specified soft tissue disorders: Secondary | ICD-10-CM

## 2018-06-09 DIAGNOSIS — E860 Dehydration: Secondary | ICD-10-CM | POA: Diagnosis present

## 2018-06-09 DIAGNOSIS — C3492 Malignant neoplasm of unspecified part of left bronchus or lung: Secondary | ICD-10-CM | POA: Diagnosis not present

## 2018-06-09 DIAGNOSIS — Z6826 Body mass index (BMI) 26.0-26.9, adult: Secondary | ICD-10-CM

## 2018-06-09 DIAGNOSIS — R748 Abnormal levels of other serum enzymes: Secondary | ICD-10-CM

## 2018-06-09 DIAGNOSIS — L0390 Cellulitis, unspecified: Secondary | ICD-10-CM | POA: Diagnosis not present

## 2018-06-09 DIAGNOSIS — J8410 Pulmonary fibrosis, unspecified: Secondary | ICD-10-CM | POA: Diagnosis present

## 2018-06-09 DIAGNOSIS — R634 Abnormal weight loss: Secondary | ICD-10-CM

## 2018-06-09 DIAGNOSIS — E538 Deficiency of other specified B group vitamins: Secondary | ICD-10-CM | POA: Diagnosis not present

## 2018-06-09 DIAGNOSIS — Z888 Allergy status to other drugs, medicaments and biological substances status: Secondary | ICD-10-CM

## 2018-06-09 DIAGNOSIS — M79604 Pain in right leg: Secondary | ICD-10-CM | POA: Diagnosis not present

## 2018-06-09 DIAGNOSIS — D509 Iron deficiency anemia, unspecified: Secondary | ICD-10-CM | POA: Diagnosis present

## 2018-06-09 DIAGNOSIS — Z66 Do not resuscitate: Secondary | ICD-10-CM | POA: Diagnosis present

## 2018-06-09 DIAGNOSIS — E43 Unspecified severe protein-calorie malnutrition: Secondary | ICD-10-CM | POA: Diagnosis present

## 2018-06-09 DIAGNOSIS — R609 Edema, unspecified: Secondary | ICD-10-CM

## 2018-06-09 DIAGNOSIS — M109 Gout, unspecified: Secondary | ICD-10-CM | POA: Diagnosis present

## 2018-06-09 DIAGNOSIS — J449 Chronic obstructive pulmonary disease, unspecified: Secondary | ICD-10-CM | POA: Diagnosis present

## 2018-06-09 DIAGNOSIS — H9190 Unspecified hearing loss, unspecified ear: Secondary | ICD-10-CM | POA: Diagnosis present

## 2018-06-09 DIAGNOSIS — M069 Rheumatoid arthritis, unspecified: Secondary | ICD-10-CM | POA: Diagnosis present

## 2018-06-09 DIAGNOSIS — R1310 Dysphagia, unspecified: Secondary | ICD-10-CM | POA: Diagnosis present

## 2018-06-09 DIAGNOSIS — F329 Major depressive disorder, single episode, unspecified: Secondary | ICD-10-CM | POA: Diagnosis present

## 2018-06-09 DIAGNOSIS — Z7951 Long term (current) use of inhaled steroids: Secondary | ICD-10-CM

## 2018-06-09 DIAGNOSIS — Z86718 Personal history of other venous thrombosis and embolism: Secondary | ICD-10-CM

## 2018-06-09 DIAGNOSIS — J439 Emphysema, unspecified: Secondary | ICD-10-CM

## 2018-06-09 DIAGNOSIS — Z886 Allergy status to analgesic agent status: Secondary | ICD-10-CM

## 2018-06-09 DIAGNOSIS — Z7952 Long term (current) use of systemic steroids: Secondary | ICD-10-CM | POA: Diagnosis not present

## 2018-06-09 DIAGNOSIS — Z6820 Body mass index (BMI) 20.0-20.9, adult: Secondary | ICD-10-CM | POA: Diagnosis not present

## 2018-06-09 DIAGNOSIS — I10 Essential (primary) hypertension: Secondary | ICD-10-CM | POA: Diagnosis present

## 2018-06-09 DIAGNOSIS — C3432 Malignant neoplasm of lower lobe, left bronchus or lung: Secondary | ICD-10-CM | POA: Diagnosis not present

## 2018-06-09 DIAGNOSIS — E440 Moderate protein-calorie malnutrition: Secondary | ICD-10-CM | POA: Diagnosis present

## 2018-06-09 DIAGNOSIS — Z923 Personal history of irradiation: Secondary | ICD-10-CM

## 2018-06-09 DIAGNOSIS — D7589 Other specified diseases of blood and blood-forming organs: Secondary | ICD-10-CM | POA: Diagnosis not present

## 2018-06-09 DIAGNOSIS — Z85118 Personal history of other malignant neoplasm of bronchus and lung: Secondary | ICD-10-CM

## 2018-06-09 DIAGNOSIS — E785 Hyperlipidemia, unspecified: Secondary | ICD-10-CM | POA: Diagnosis present

## 2018-06-09 DIAGNOSIS — D696 Thrombocytopenia, unspecified: Secondary | ICD-10-CM

## 2018-06-09 DIAGNOSIS — Z716 Tobacco abuse counseling: Secondary | ICD-10-CM

## 2018-06-09 DIAGNOSIS — R627 Adult failure to thrive: Secondary | ICD-10-CM | POA: Diagnosis not present

## 2018-06-09 DIAGNOSIS — Z79899 Other long term (current) drug therapy: Secondary | ICD-10-CM | POA: Diagnosis not present

## 2018-06-09 DIAGNOSIS — D539 Nutritional anemia, unspecified: Secondary | ICD-10-CM | POA: Diagnosis present

## 2018-06-09 DIAGNOSIS — R630 Anorexia: Secondary | ICD-10-CM

## 2018-06-09 DIAGNOSIS — Z801 Family history of malignant neoplasm of trachea, bronchus and lung: Secondary | ICD-10-CM

## 2018-06-09 DIAGNOSIS — M6281 Muscle weakness (generalized): Secondary | ICD-10-CM | POA: Diagnosis not present

## 2018-06-09 DIAGNOSIS — M549 Dorsalgia, unspecified: Secondary | ICD-10-CM | POA: Diagnosis not present

## 2018-06-09 DIAGNOSIS — F419 Anxiety disorder, unspecified: Secondary | ICD-10-CM | POA: Diagnosis present

## 2018-06-09 DIAGNOSIS — L03115 Cellulitis of right lower limb: Secondary | ICD-10-CM | POA: Diagnosis present

## 2018-06-09 DIAGNOSIS — F418 Other specified anxiety disorders: Secondary | ICD-10-CM

## 2018-06-09 DIAGNOSIS — Z7901 Long term (current) use of anticoagulants: Secondary | ICD-10-CM

## 2018-06-09 DIAGNOSIS — D61818 Other pancytopenia: Secondary | ICD-10-CM | POA: Diagnosis not present

## 2018-06-09 DIAGNOSIS — M545 Low back pain: Secondary | ICD-10-CM | POA: Diagnosis not present

## 2018-06-09 DIAGNOSIS — R110 Nausea: Secondary | ICD-10-CM | POA: Diagnosis not present

## 2018-06-09 LAB — BASIC METABOLIC PANEL
ANION GAP: 9 (ref 5–15)
BUN: 23 mg/dL (ref 8–23)
CALCIUM: 8.9 mg/dL (ref 8.9–10.3)
CO2: 27 mmol/L (ref 22–32)
CREATININE: 1.25 mg/dL — AB (ref 0.61–1.24)
Chloride: 104 mmol/L (ref 98–111)
GFR calc Af Amer: >60 mL/min (ref >60)
GFR calc non Af Amer: 56 mL/min — ABNORMAL LOW (ref >60)
Glucose, Bld: 120 mg/dL — ABNORMAL HIGH (ref 70–99)
Potassium: 4.2 mmol/L (ref 3.5–5.1)
SODIUM: 140 mmol/L (ref 135–145)

## 2018-06-09 LAB — CBC
HCT: 30.3 % — ABNORMAL LOW (ref 39.0–52.0)
HEMOGLOBIN: 9.6 g/dL — AB (ref 13.0–17.0)
MCH: 34.2 pg — ABNORMAL HIGH (ref 26.0–34.0)
MCHC: 31.7 g/dL (ref 30.0–36.0)
MCV: 107.8 fL — ABNORMAL HIGH (ref 78.0–100.0)
PLATELETS: 10 10*3/uL — AB (ref 150–400)
RBC: 2.81 MIL/uL — AB (ref 4.22–5.81)
RDW: 13.5 % (ref 11.5–15.5)
WBC: 1.6 10*3/uL — AB (ref 4.0–10.5)

## 2018-06-09 LAB — HEPATIC FUNCTION PANEL
ALK PHOS: 79 U/L (ref 38–126)
ALT: 19 U/L (ref 0–44)
AST: 26 U/L (ref 15–41)
Albumin: 3.3 g/dL — ABNORMAL LOW (ref 3.5–5.0)
BILIRUBIN DIRECT: 0.2 mg/dL (ref 0.0–0.2)
BILIRUBIN INDIRECT: 0.4 mg/dL (ref 0.3–0.9)
BILIRUBIN TOTAL: 0.6 mg/dL (ref 0.3–1.2)
Total Protein: 7.3 g/dL (ref 6.5–8.1)

## 2018-06-09 LAB — DIFFERENTIAL
BASOS PCT: 0 %
Basophils Absolute: 0 10*3/uL (ref 0.0–0.1)
EOS PCT: 10 %
Eosinophils Absolute: 0.2 10*3/uL (ref 0.0–0.7)
LYMPHS ABS: 0.6 10*3/uL — AB (ref 0.7–4.0)
Lymphocytes Relative: 35 %
MONO ABS: 0 10*3/uL — AB (ref 0.1–1.0)
Monocytes Relative: 1 %
NEUTROS ABS: 0.8 10*3/uL — AB (ref 1.7–7.7)
Neutrophils Relative %: 54 %

## 2018-06-09 LAB — IRON AND TIBC
IRON: 98 ug/dL (ref 45–182)
Saturation Ratios: 41 % — ABNORMAL HIGH (ref 17.9–39.5)
TIBC: 237 ug/dL — ABNORMAL LOW (ref 250–450)
UIBC: 139 ug/dL

## 2018-06-09 LAB — PROTIME-INR
INR: 1.13
PROTHROMBIN TIME: 14.4 s (ref 11.4–15.2)

## 2018-06-09 LAB — PHOSPHORUS: PHOSPHORUS: 2.5 mg/dL (ref 2.5–4.6)

## 2018-06-09 LAB — FIBRINOGEN: FIBRINOGEN: 755 mg/dL — AB (ref 210–475)

## 2018-06-09 LAB — LACTATE DEHYDROGENASE: LDH: 200 U/L — AB (ref 98–192)

## 2018-06-09 LAB — DIRECT ANTIGLOBULIN TEST (NOT AT ARMC)
DAT, COMPLEMENT: NEGATIVE
DAT, IGG: NEGATIVE

## 2018-06-09 LAB — RETICULOCYTES: RBC.: 2.8 MIL/uL — AB (ref 4.22–5.81)

## 2018-06-09 LAB — APTT: aPTT: 39 seconds — ABNORMAL HIGH (ref 24–36)

## 2018-06-09 LAB — FOLATE: Folate: 4.5 ng/mL — ABNORMAL LOW (ref >5.9)

## 2018-06-09 LAB — VITAMIN B12: Vitamin B-12: 357 pg/mL (ref 180–914)

## 2018-06-09 LAB — FERRITIN: Ferritin: 709 ng/mL — ABNORMAL HIGH (ref 24–336)

## 2018-06-09 LAB — MAGNESIUM: MAGNESIUM: 2.1 mg/dL (ref 1.7–2.4)

## 2018-06-09 LAB — LACTIC ACID, PLASMA: LACTIC ACID, VENOUS: 1.4 mmol/L (ref 0.5–1.9)

## 2018-06-09 MED ORDER — SODIUM CHLORIDE 0.9 % IV SOLN
2.0000 g | INTRAVENOUS | Status: DC
  Administered 2018-06-10 – 2018-06-12 (×4): 2 g via INTRAVENOUS
  Filled 2018-06-09 (×4): qty 2
  Filled 2018-06-09: qty 20

## 2018-06-09 MED ORDER — HYDROCODONE-ACETAMINOPHEN 5-325 MG PO TABS
1.0000 | ORAL_TABLET | Freq: Once | ORAL | Status: AC
  Administered 2018-06-09: 1 via ORAL
  Filled 2018-06-09: qty 1

## 2018-06-09 MED ORDER — POLYETHYLENE GLYCOL 3350 17 G PO PACK: 17.0000 g | PACK | Freq: Every day | ORAL | Status: DC | PRN

## 2018-06-09 MED ORDER — ACETAMINOPHEN 325 MG PO TABS: 650.0000 mg | ORAL_TABLET | Freq: Four times a day (QID) | ORAL | Status: DC | PRN

## 2018-06-09 MED ORDER — HYDROCODONE-ACETAMINOPHEN 5-325 MG PO TABS
1.0000 | ORAL_TABLET | ORAL | Status: DC | PRN
  Administered 2018-06-09 – 2018-06-11 (×7): 1 via ORAL
  Administered 2018-06-11 (×4): 2 via ORAL
  Administered 2018-06-12 (×2): 1 via ORAL
  Administered 2018-06-12 – 2018-06-15 (×15): 2 via ORAL
  Filled 2018-06-09 (×4): qty 2
  Filled 2018-06-09: qty 1
  Filled 2018-06-09: qty 2
  Filled 2018-06-09 (×2): qty 1
  Filled 2018-06-09: qty 2
  Filled 2018-06-09 (×2): qty 1
  Filled 2018-06-09 (×4): qty 2
  Filled 2018-06-09 (×2): qty 1
  Filled 2018-06-09: qty 2
  Filled 2018-06-09 (×4): qty 1
  Filled 2018-06-09 (×6): qty 2
  Filled 2018-06-09: qty 1
  Filled 2018-06-09 (×2): qty 2

## 2018-06-09 MED ORDER — FOLIC ACID 1 MG PO TABS
1.0000 mg | ORAL_TABLET | Freq: Every day | ORAL | Status: DC
  Administered 2018-06-10: 1 mg via ORAL
  Filled 2018-06-09: qty 1

## 2018-06-09 MED ORDER — VALACYCLOVIR HCL 500 MG PO TABS
500.0000 mg | ORAL_TABLET | Freq: Two times a day (BID) | ORAL | Status: DC
  Administered 2018-06-09 – 2018-06-15 (×12): 500 mg via ORAL
  Filled 2018-06-09 (×14): qty 1

## 2018-06-09 MED ORDER — ONDANSETRON HCL 4 MG/2ML IJ SOLN: 4.0000 mg | Freq: Four times a day (QID) | INTRAMUSCULAR | Status: DC | PRN

## 2018-06-09 MED ORDER — SODIUM CHLORIDE 0.9% IV SOLUTION
Freq: Once | INTRAVENOUS | Status: AC
  Administered 2018-06-09: 23:00:00 via INTRAVENOUS

## 2018-06-09 MED ORDER — SENNA 8.6 MG PO TABS
1.0000 | ORAL_TABLET | Freq: Two times a day (BID) | ORAL | Status: DC
  Administered 2018-06-09 – 2018-06-15 (×12): 8.6 mg via ORAL
  Filled 2018-06-09 (×12): qty 1

## 2018-06-09 MED ORDER — ONDANSETRON HCL 4 MG PO TABS
4.0000 mg | ORAL_TABLET | Freq: Four times a day (QID) | ORAL | Status: DC | PRN
  Filled 2018-06-09: qty 1

## 2018-06-09 MED ORDER — ROSUVASTATIN CALCIUM 10 MG PO TABS
10.0000 mg | ORAL_TABLET | Freq: Every evening | ORAL | Status: DC
  Administered 2018-06-09 – 2018-06-14 (×6): 10 mg via ORAL
  Filled 2018-06-09 (×5): qty 1

## 2018-06-09 MED ORDER — ACETAMINOPHEN 650 MG RE SUPP: 650.0000 mg | Freq: Four times a day (QID) | RECTAL | Status: DC | PRN

## 2018-06-09 MED ORDER — MOMETASONE FURO-FORMOTEROL FUM 100-5 MCG/ACT IN AERO
2.0000 | INHALATION_SPRAY | Freq: Two times a day (BID) | RESPIRATORY_TRACT | Status: DC
  Administered 2018-06-10 – 2018-06-15 (×11): 2 via RESPIRATORY_TRACT
  Filled 2018-06-09: qty 8.8

## 2018-06-09 MED ORDER — TIOTROPIUM BROMIDE MONOHYDRATE 18 MCG IN CAPS
1.0000 | ORAL_CAPSULE | Freq: Every day | RESPIRATORY_TRACT | Status: DC
  Administered 2018-06-10 – 2018-06-15 (×6): 18 ug via RESPIRATORY_TRACT
  Filled 2018-06-09 (×2): qty 5

## 2018-06-09 MED ORDER — PREDNISONE 5 MG PO TABS
2.5000 mg | ORAL_TABLET | Freq: Every day | ORAL | Status: DC
  Administered 2018-06-10 – 2018-06-15 (×6): 2.5 mg via ORAL
  Filled 2018-06-09 (×6): qty 1

## 2018-06-09 MED ORDER — SODIUM CHLORIDE 0.9 % IV SOLN
INTRAVENOUS | Status: AC
  Administered 2018-06-10: via INTRAVENOUS

## 2018-06-09 MED ORDER — CLONAZEPAM 0.5 MG PO TABS
0.5000 mg | ORAL_TABLET | Freq: Two times a day (BID) | ORAL | Status: DC | PRN
  Administered 2018-06-15: 0.5 mg via ORAL
  Filled 2018-06-09: qty 1

## 2018-06-09 NOTE — ED Provider Notes (Signed)
[facility] DEPT Provider Note   CSN: [id number] Arrival date & time: 06/09/18  1510     History   Chief Complaint Chief Complaint  Patient presents with  . Abnormal Lab    low platelets    HPI Kenneth [name] is a 72 y.o. male.  72 year old male with prior medical history as detailed below presents for evaluation of possible thrombocytopenia.  Patient reports that he went to his primary care provider at [facility] today.  He had been experiencing some discomfort in his right leg.  Ultrasounds were obtained today of both lower extremities that showed no DVT.  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  Blood work was obtained today which revealed a white count of 2.4, hemoglobin of 10.1, a hematocrit of 34, and a platelet count of 12. He was sent to the ED for further evaluation.   Patient denies prior history of thrombocytopenia.  Patient denies any significant recent bleeding.  He does wear report a nosebleed approximately 3 weeks ago.  He specifically denies blood in the stool, bleeding from his gums, or other significant bleeding events.  The history is provided by the patient and medical records.  Illness  This is a new problem. The current episode started 12 to 24 hours ago. The problem occurs rarely. The problem has not changed since onset.Pertinent negatives include no chest pain, no abdominal pain, no headaches and no shortness of breath. Nothing aggravates the symptoms. Nothing relieves the symptoms.    Past Medical History:  Diagnosis Date  . Abnormal LFTs   . Anxiety   . COPD (chronic obstructive pulmonary disease) (HCC)    emphysema.  . CTS (carpal tunnel syndrome)    Left  . DDD (degenerative disc disease)   . Depression    mild  . Elevated MCV   . Emphysema of lung ([place])   . Gout   . Gynecomastia   . Hearing loss   . History of radiation therapy 10/17/17-10/24/17   left lung treated to 54  Gy in 3 fractions  . Hyperlipidemia   . Microhematuria   . Nephrolithiasis   . Ocular migraine   . Panic attack   . Rheumatoid arthritis(714.0) dx 2000  . Situational hypertension   . Tobacco abuse     Patient Active Problem List   Diagnosis Date Noted  . Anemia due to folic acid deficiency 13/05/6577  . Non-small cell cancer of left lung ([place]) 09/05/2017  . Lung nodule 08/01/2017  . Protein-calorie malnutrition, severe 04/30/2016  . Esophagitis 04/29/2016  . Dysphagia 04/29/2016  . Odynophagia   . Abnormal loss of weight   . Emphysema 09/28/2011  . Interstitial fibrosis 09/28/2011    Past Surgical History:  Procedure Laterality Date  . APPENDECTOMY    . COLONOSCOPY    . CYSTOSTOMY W/ BLADDER BIOPSY  2006  . ESOPHAGOGASTRODUODENOSCOPY N/A 04/30/2016   Procedure: ESOPHAGOGASTRODUODENOSCOPY (EGD);  Surgeon: Doran Stabler, MD;  Location: [facility];  Service: Endoscopy;  Laterality: N/A;        Home Medications    Prior to Admission medications   Medication Sig Start Date End Date Taking? Authorizing Provider  acetaminophen (TYLENOL) 325 MG tablet Take 325 mg by mouth every 4 (four) hours as needed for mild pain.     [provider]  amLODipine (NORVASC) 5 MG tablet Take 1 tablet daily by mouth. 08/06/17   [provider]  apixaban Arne Cleveland) 2.5  MG TABS tablet Take 2.5 mg 2 (two) times daily by mouth.    [provider]  budesonide-formoterol (SYMBICORT) 80-4.5 MCG/ACT inhaler Inhale 2 puffs into the lungs 2 (two) times daily.    [provider]  cholecalciferol (VITAMIN D) 1000 UNITS tablet Take 1,000 Units by mouth daily.      [provider]  clonazePAM (KLONOPIN) 0.5 MG tablet Take 0.5 mg at bedtime by mouth.     [provider]  Dextromethorphan-Guaifenesin (TUSSIN DM PO) Take 1 tablet by mouth every 6 (six) hours as needed (cough and cold).     [provider]  fluticasone (FLONASE) 50 MCG/ACT nasal  spray Place 2 sprays into the nose daily.      [provider]  folic acid (FOLVITE) 1 MG tablet Take 1 mg by mouth daily.      [provider]  guaiFENesin (ROBITUSSIN) 100 MG/5ML SOLN Take 5 mLs by mouth every 4 (four) hours as needed for cough or to loosen phlegm.    [provider]  HYDROcodone-acetaminophen (NORCO/VICODIN) 5-325 MG tablet Take 1 tablet every 6 (six) hours as needed by mouth for moderate pain.    [provider]  levofloxacin (LEVAQUIN) 500 MG tablet TK 1 T PO QD FOR 7 DAYS 11/18/17   [provider]  methotrexate (RHEUMATREX) 2.5 MG tablet Take 7.5 mg by mouth 2 (two) times a week. Take 4 tabs every Friday 4 tabs on Sunday    [provider]  nystatin (MYCOSTATIN) 100000 UNIT/ML suspension Take 5 mLs (500,000 Units total) by mouth 4 (four) times daily. 05/01/16   Hollice Gong, Mir Mohammed, MD  pantoprazole (PROTONIX) 40 MG tablet Take 1 tablet (40 mg total) by mouth daily. 05/01/16   Hollice Gong, Mir Mohammed, MD  predniSONE (DELTASONE) 20 MG tablet TK 2 TS PO QD WITH FOOD FOR 10 DAYS 11/18/17   [provider]  rosuvastatin (CRESTOR) 20 MG tablet Take 10 mg every evening by mouth.     [provider]  Tiotropium Bromide Monohydrate (SPIRIVA RESPIMAT) 1.25 MCG/ACT AERS Inhale 2 puffs into the lungs daily.    [provider]  valACYclovir (VALTREX) 500 MG tablet Take 500 mg 2 (two) times daily by mouth.    [provider]  zoledronic acid (RECLAST) 5 MG/100ML SOLN Inject 5 mg into the vein once. Every 2 years     [provider]    Family History Family History  Problem Relation Age of Onset  . Lung cancer Father   . Asthma Paternal Grandmother   . Lung cancer Paternal Aunt   . Breast cancer Maternal Aunt   . Breast cancer Maternal Aunt   . Stomach cancer Maternal Uncle   . Colon cancer Neg Hx     Social History Social History   Tobacco Use  . Smoking status: Current Every  Day Smoker    Packs/day: 0.50    Years: 44.00    Pack years: 22.00    Types: Cigarettes  . Smokeless tobacco: Never Used  Substance Use Topics  . Alcohol use: No  . Drug use: No     Allergies   Other; Aspirin; and Naproxen sodium   Review of Systems Review of Systems  Respiratory: Negative for shortness of breath.   Cardiovascular: Negative for chest pain.  Gastrointestinal: Negative for abdominal pain.  Neurological: Negative for headaches.  All other systems reviewed and are negative.    Physical Exam Updated Vital Signs BP (!) 179/84  Pulse 74   Temp 99.4 F (37.4 C) (Oral)   Resp (!) 29   Ht 5\' 3"  (1.6 m)   Wt 67.4 kg   SpO2 99%   BMI 26.32 kg/m   Physical Exam  Constitutional: He is oriented to person, place, and time. He appears well-developed and well-nourished. No distress.  HENT:  Head: Normocephalic and atraumatic.  Mouth/Throat: Oropharynx is clear and moist.  Eyes: Pupils are equal, round, and reactive to light. Conjunctivae and EOM are normal.  Neck: Normal range of motion. Neck supple.  Cardiovascular: Normal rate, regular rhythm and normal heart sounds.  Pulmonary/Chest: Effort normal and breath sounds normal. No respiratory distress.  Abdominal: Soft. He exhibits no distension. There is no tenderness.  Musculoskeletal: Normal range of motion. He exhibits no edema or deformity.  Neurological: He is alert and oriented to person, place, and time.  Skin: Skin is warm and dry.  Psychiatric: He has a normal mood and affect.  Nursing note and vitals reviewed.    ED Treatments / Results  Labs (all labs ordered are listed, but only abnormal results are displayed) Labs Reviewed  BASIC METABOLIC PANEL - Abnormal; Notable for the following components:      Result Value   Glucose, Bld 120 (*)    Creatinine, Ser 1.25 (*)    GFR calc non Af Amer 56 (*)    All other components within normal limits  CBC - Abnormal; Notable for the following  components:   WBC 1.6 (*)    RBC 2.81 (*)    Hemoglobin 9.6 (*)    HCT 30.3 (*)    MCV 107.8 (*)    MCH 34.2 (*)    All other components within normal limits  PROTIME-INR  TYPE AND SCREEN    EKG None  Radiology No results found.  Procedures Procedures (including critical care time)  Medications Ordered in ED Medications  HYDROcodone-acetaminophen (NORCO/VICODIN) 5-325 MG per tablet 1 tablet (1 tablet Oral Given 06/09/18 1645)     Initial Impression / Assessment and Plan / ED Course  I have reviewed the triage vital signs and the nursing notes.  Pertinent labs & imaging results that were available during my care of the patient were reviewed by me and considered in my medical decision making (see chart for details).      [facility] aware of case - Dr. Alen Blew - who is seeing the patient in the ED. Patient to be admitted to the Hospitalist service for further workup.    MDM  Screen complete  Patient presenting for evaluation of thrombocytopenia. Patient without evidence of acute bleeding.   Hematology aware of case and requests inpatient workup.  Hospitalist service [facility]) aware of case and will evaluate for admission.   Final Clinical Impressions(s) / ED Diagnoses   Final diagnoses:  Thrombocytopenia [facility])    ED Discharge Orders    None       Valarie Merino, MD 06/09/18 [phone]

## 2018-06-09 NOTE — ED Triage Notes (Signed)
Pt sent from PCP for low platelets. Pt had platelet count of 12. Pt states he feels weak, had a nose bleed a few weeks ago. PCP wants repeat CBC and to rule our TTP/ITP/acute leukemia. Pt is on Eliquis.

## 2018-06-09 NOTE — ED Notes (Signed)
ED TO INPATIENT HANDOFF REPORT  Name/Age/Gender Kenneth [name] 72 y.o. male  Code Status Code Status History    Date Active Date Inactive Code Status Order ID Comments User Context   04/29/2016 2019 05/01/2016 2208 Full Code [id number]  Florencia Reasons, MD Inpatient    Advance Directive Documentation     Most Recent Value  Type of Advance Directive  Living will, Healthcare Power of Attorney  Pre-existing out of facility DNR order (yellow form or pink MOST form)  -  "MOST" Form in Place?  -      Home/SNF/Other Home  Chief Complaint Low platelet count  Level of Care/Admitting Diagnosis ED Disposition    ED Disposition Condition [place]: [facility] [100102]  Level of Care: Med-Surg [16]  Diagnosis: Pancytopenia ([place]) [282060]  Admitting Physician: Toy Baker [[id number]]  Attending Physician: Toy Baker [[id number]]  Estimated length of stay: 3 - 4 days  Certification:: I certify this patient will need inpatient services for at least 2 midnights  PT Class (Do Not Modify): Inpatient [101]  PT Acc Code (Do Not Modify): Private [1]       Medical History Past Medical History:  Diagnosis Date  . Abnormal LFTs   . Anxiety   . COPD (chronic obstructive pulmonary disease) (HCC)    emphysema.  . CTS (carpal tunnel syndrome)    Left  . DDD (degenerative disc disease)   . Depression    mild  . Elevated MCV   . Emphysema of lung ([place])   . Gout   . Gynecomastia   . Hearing loss   . History of radiation therapy 10/17/17-10/24/17   left lung treated to 54 Gy in 3 fractions  . Hyperlipidemia   . Microhematuria   . Nephrolithiasis   . Ocular migraine   . Panic attack   . Rheumatoid arthritis(714.0) dx 2000  . Situational hypertension   . Tobacco abuse     Allergies Allergies  Allergen Reactions  . Other     "CILLIN" Family  Has patient had a PCN reaction causing immediate rash, facial/tongue/throat swelling, SOB or  lightheadedness with hypotension: no Has patient had a PCN reaction causing severe rash involving mucus membranes or skin necrosis: yes Has patient had a PCN reaction that required hospitalization: no Has patient had a PCN reaction occurring within the last 10 years: no If all of the above answers are "NO", then may proceed with Cephalosporin use.   . Aspirin Other (See Comments)    Gi upset  . Naproxen Sodium Other (See Comments)    Whelps in large doses **ALEVE**    IV Location/Drains/Wounds Patient Lines/Drains/Airways Status   Active Line/Drains/Airways    Name:   Placement date:   Placement time:   Site:   Days:   Peripheral IV 06/09/18 Right Antecubital   06/09/18    1625    Antecubital   less than 1          Labs/Imaging Results for orders placed or performed during the hospital encounter of 06/09/18 (from the past 48 hour(s))  Lactate dehydrogenase     Status: Abnormal   Collection Time: 06/09/18  4:17 PM  Result Value Ref Range   LDH 200 (H) 98 - 192 U/L    Comment: Performed at [facility], [place] [address] [place], [place] [address]  APTT     Status: Abnormal   Collection Time: 06/09/18  4:17 PM  Result Value Ref  Range   aPTT 39 (H) 24 - 36 seconds    Comment:        IF BASELINE aPTT IS ELEVATED, SUGGEST PATIENT RISK ASSESSMENT BE USED TO DETERMINE APPROPRIATE ANTICOAGULANT THERAPY. Performed at [facility], [place] [address], [place], [place] [address]   Hepatic function panel     Status: Abnormal   Collection Time: 06/09/18  4:17 PM  Result Value Ref Range   Total Protein 7.3 6.5 - 8.1 g/dL   Albumin 3.3 (L) 3.5 - 5.0 g/dL   AST 26 15 - 41 U/L   ALT 19 0 - 44 U/L   Alkaline Phosphatase 79 38 - 126 U/L   Total Bilirubin 0.6 0.3 - 1.2 mg/dL   Bilirubin, Direct 0.2 0.0 - 0.2 mg/dL   Indirect Bilirubin 0.4 0.3 - 0.9 mg/dL    Comment: Performed at [facility], [place] [address] [place], [place] [address]   Magnesium     Status: None   Collection Time: 06/09/18  4:17 PM  Result Value Ref Range   Magnesium 2.1 1.7 - 2.4 mg/dL    Comment: Performed at [facility], [place] [address] [place], [place] [address]  Phosphorus     Status: None   Collection Time: 06/09/18  4:17 PM  Result Value Ref Range   Phosphorus 2.5 2.5 - 4.6 mg/dL    Comment: Performed at [facility], [place] [address], [place], [place] [address]  Basic metabolic panel     Status: Abnormal   Collection Time: 06/09/18  4:18 PM  Result Value Ref Range   Sodium 140 135 - 145 mmol/L   Potassium 4.2 3.5 - 5.1 mmol/L   Chloride 104 98 - 111 mmol/L   CO2 27 22 - 32 mmol/L   Glucose, Bld 120 (H) 70 - 99 mg/dL   BUN 23 8 - 23 mg/dL   Creatinine, Ser 1.25 (H) 0.61 - 1.24 mg/dL   Calcium 8.9 8.9 - 10.3 mg/dL   GFR calc non Af Amer 56 (L) >60 mL/min   GFR calc Af Amer >60 >60 mL/min    Comment: (NOTE) The eGFR has been calculated using the CKD EPI equation. This calculation has not been validated in all clinical situations. eGFR's persistently <60 mL/min signify possible Chronic Kidney Disease.    Anion gap 9 5 - 15    Comment: Performed at [facility], [place] [address], [place], [place] [address]  CBC     Status: Abnormal   Collection Time: 06/09/18  4:18 PM  Result Value Ref Range   WBC 1.6 (L) 4.0 - 10.5 K/uL   RBC 2.81 (L) 4.22 - 5.81 MIL/uL   Hemoglobin 9.6 (L) 13.0 - 17.0 g/dL   HCT 30.3 (L) 39.0 - 52.0 %   MCV 107.8 (H) 78.0 - 100.0 fL   MCH 34.2 (H) 26.0 - 34.0 pg   MCHC 31.7 30.0 - 36.0 g/dL   RDW 13.5 11.5 - 15.5 %   Platelets 10 (LL) 150 - 400 K/uL    Comment: REPEATED TO VERIFY SPECIMEN CHECKED FOR CLOTS PLATELET COUNT CONFIRMED BY SMEAR CRITICAL RESULT CALLED TO, READ BACK BY AND VERIFIED WITH: S.WEST AT 1739 ON 06/09/18 BY N.THOMPSON Performed at [facility], [place] [address] [place],  [address]   Type and screen [place]     Status: None   Collection Time: 06/09/18  4:18 PM  Result Value Ref Range  ABO/RH(D) AB POS    Antibody Screen NEG    Sample Expiration      06/12/2018 Performed at [facility], [place] [address], [place], [place] [address]   Protime-INR     Status: None   Collection Time: 06/09/18  4:18 PM  Result Value Ref Range   Prothrombin Time 14.4 11.4 - 15.2 seconds   INR 1.13     Comment: Performed at [facility], [place] [address] [place], [place] [address]  Differential     Status: None (Preliminary result)   Collection Time: 06/09/18  4:18 PM  Result Value Ref Range   Neutrophils Relative % PENDING %   Neutro Abs PENDING 1.7 - 7.7 K/uL   Band Neutrophils PENDING %   Lymphocytes Relative PENDING %   Lymphs Abs PENDING 0.7 - 4.0 K/uL   Monocytes Relative PENDING %   Monocytes Absolute PENDING 0.1 - 1.0 K/uL   Eosinophils Relative PENDING %   Eosinophils Absolute PENDING 0.0 - 0.7 K/uL   Basophils Relative PENDING %   Basophils Absolute PENDING 0.0 - 0.1 K/uL   WBC Morphology PENDING    RBC Morphology PENDING    Smear Review PENDING    nRBC PENDING 0 /100 WBC   Metamyelocytes Relative PENDING %   Myelocytes PENDING %   Promyelocytes Relative PENDING %   Blasts PENDING %   No results found.  Pending Labs Unresulted Labs (From admission, onward)    Start     Ordered   06/09/18 2012  Fibrinogen  Add-on,   R     06/09/18 2011   06/09/18 2012  Haptoglobin  Add-on,   R     06/09/18 2011   06/09/18 2011  Sodium, urine, random  Once,   R     06/09/18 2010   06/09/18 2011  Creatinine, urine, random  Once,   R     06/09/18 2010   06/09/18 2011  Lactic acid, plasma  Once,   STAT     06/09/18 2010   06/09/18 2010  Culture, blood (Routine X 2) w Reflex to ID Panel  BLOOD CULTURE X 2,   R     06/09/18 2009   06/09/18 2005  MRSA PCR Screening  Once,   R    Question:  Patient immune status  Answer:  Immunocompromised    06/09/18 2004   06/09/18 1940  Urinalysis, Routine w reflex microscopic  Once,   R     06/09/18 1939   06/09/18 1931  Protein electrophoresis, serum  Once,   R     06/09/18 1930   06/09/18 1928  Direct antiglobulin test (not at [facility])  Once,   R     06/09/18 1927   06/09/18 1927  Vitamin B12  (Anemia Panel (PNL))  Once,   R     06/09/18 1927   06/09/18 1927  Folate  (Anemia Panel (PNL))  Once,   R     06/09/18 1927   06/09/18 1927  Iron and TIBC  (Anemia Panel (PNL))  Once,   R     06/09/18 1927   06/09/18 1927  Ferritin  (Anemia Panel (PNL))  Once,   R     06/09/18 1927   06/09/18 1927  Reticulocytes  (Anemia Panel (PNL))  Once,   R     06/09/18 1927   06/09/18 1744  CBC with Differential  Add-on,   STAT    Comments:  Please perform  differential    06/09/18 1743   06/09/18 1618  ABO/Rh  Once,   R     06/09/18 1618   Signed and Held  Prepare Pheresed Platelets  (Adult Blood Administration - Platelets (Pheresed))  Once,   R    Question Answer Comment  Number of Apheresis Units 1 unit (6-10 packs)   Transfusion Indications PLT Count </=10,000/mm   If emergent release call blood bank Not emergent release      Signed and Held   Signed and Held  Magnesium  Tomorrow morning,   R    Comments:  Call MD if <1.5    Signed and Held   Signed and Held  Phosphorus  Tomorrow morning,   R     Signed and Held   Signed and Held  TSH  Once,   R    Comments:  Cancel if already done within 1 month and notify MD    Signed and Held   Signed and Held  Comprehensive metabolic panel  Once,   R    Comments:  Cal MD for K<3.5 or >5.0    Signed and Held   Signed and Held  CBC  Once,   R    Comments:  Call for hg <8.0    Signed and Held          Vitals/Pain Today's Vitals   06/09/18 1800 06/09/18 1830 06/09/18 1900 06/09/18 1930  BP: (!) 158/86 (!) 150/76 (!) 159/74 (!) 143/72  Pulse: 80 73 73 71  Resp: (!) 23 (!) 29 (!) 22 (!) 24  Temp:      TempSrc:      SpO2: 97% 97% 98% 96%   Weight:      Height:      PainSc:        Isolation Precautions No active isolations  Medications Medications  HYDROcodone-acetaminophen (NORCO/VICODIN) 5-325 MG per tablet 1 tablet (1 tablet Oral Given 06/09/18 1645)    Mobility walks

## 2018-06-09 NOTE — Consult Note (Signed)
Reason for the request:    Pancytopenia  HPI: I was asked by Dr. Francia Greaves to evaluate Kenneth [name] for new onset of pancytopenia.  He is a pleasant 72 year old man with history of rheumatoid arthritis a long term use of steroids and methotrexate.  He has also history of stage Ia non-small cell cancer diagnosed in 2018 and received definitive radiation therapy under the care of Dr. Sondra Come concluded in January 2019.  For the last few months he has been reporting symptoms of excessive fatigue, tiredness and dyspnea on exertion.  He has also reported symptoms of right lower extremity swelling and was seen by Dr. Haynes Kerns his primary care physician on 06/09/2018.  At that time as CBC showed a white cell count of 2.6, hemoglobin of 10 and a platelet count of 12,000.  Based on these findings he was urgently referred to the emergency department for evaluation.  In the emergency department, CBC was obtained which showed a hemoglobin of 9.6, white cell count of 1.6 and a platelet count of 10.  His PT and INR is normal and a vascular ultrasound of his lower extremity did not show any deep vein thrombosis.  His creatinine is 1.25 with normal electrolytes.  He denies any active bleeding including hemoptysis or hematemesis.  He denies any easy bruising or petechiae.  He did report increase in the dose of methotrexate in the last 3 months.  He is currently taking total of 25 mg weekly.  He has been prescribed vitamin B12 and folate in the past although has not been taking it regularly.  Last vitamin L89 and folic acid levels in May 2019 were normal.  He does report poor appetite and weight loss.  He does not report any headaches, blurry vision, syncope or seizures. Does not report any fevers, chills or sweats.  Does not report any cough, wheezing or hemoptysis.  Does not report any chest pain, palpitation, orthopnea.  Does not report any nausea, vomiting or abdominal pain.  Does not report any constipation or diarrhea.  Does not  report any skeletal complaints.    Does not report frequency, urgency or hematuria.  Does not report any skin rashes or lesions. Does not report any heat or cold intolerance.  Does not report any lymphadenopathy or petechiae.  Does not report any anxiety or depression.  Remaining review of systems is negative.    Past Medical History:  Diagnosis Date  . Abnormal LFTs   . Anxiety   . COPD (chronic obstructive pulmonary disease) (HCC)    emphysema.  . CTS (carpal tunnel syndrome)    Left  . DDD (degenerative disc disease)   . Depression    mild  . Elevated MCV   . Emphysema of lung ([place])   . Gout   . Gynecomastia   . Hearing loss   . History of radiation therapy 10/17/17-10/24/17   left lung treated to 54 Gy in 3 fractions  . Hyperlipidemia   . Microhematuria   . Nephrolithiasis   . Ocular migraine   . Panic attack   . Rheumatoid arthritis(714.0) dx 2000  . Situational hypertension   . Tobacco abuse   :  Past Surgical History:  Procedure Laterality Date  . APPENDECTOMY    . COLONOSCOPY    . CYSTOSTOMY W/ BLADDER BIOPSY  2006  . ESOPHAGOGASTRODUODENOSCOPY N/A 04/30/2016   Procedure: ESOPHAGOGASTRODUODENOSCOPY (EGD);  Surgeon: Doran Stabler, MD;  Location: [facility];  Service: Endoscopy;  Laterality: N/A;  :  No  current facility-administered medications for this encounter.   Current Outpatient Medications:  .  acetaminophen (TYLENOL) 325 MG tablet, Take 325 mg by mouth every 4 (four) hours as needed for mild pain. , Disp: , Rfl:  .  amLODipine (NORVASC) 5 MG tablet, Take 1 tablet daily by mouth., Disp: , Rfl: 3 .  apixaban (ELIQUIS) 2.5 MG TABS tablet, Take 2.5 mg by mouth daily. , Disp: , Rfl:  .  budesonide-formoterol (SYMBICORT) 80-4.5 MCG/ACT inhaler, Inhale 2 puffs into the lungs 2 (two) times daily., Disp: , Rfl:  .  cholecalciferol (VITAMIN D) 1000 UNITS tablet, Take 1,000 Units by mouth daily.  , Disp: , Rfl:  .  clonazePAM (KLONOPIN) 0.5 MG tablet, Take 0.5 mg  by mouth 2 (two) times daily as needed for anxiety. , Disp: , Rfl:  .  Dextromethorphan-Guaifenesin (TUSSIN DM PO), Take 1 tablet by mouth every 6 (six) hours as needed (cough and cold). , Disp: , Rfl:  .  fluticasone (FLONASE) 50 MCG/ACT nasal spray, Place 2 sprays into the nose daily.  , Disp: , Rfl:  .  folic acid (FOLVITE) 1 MG tablet, Take 1 mg by mouth daily.  , Disp: , Rfl:  .  HYDROcodone-acetaminophen (NORCO/VICODIN) 5-325 MG tablet, Take 1 tablet every 6 (six) hours as needed by mouth for moderate pain., Disp: , Rfl:  .  methotrexate (RHEUMATREX) 2.5 MG tablet, Take 12.5 mg by mouth 2 (two) times a week. Take 5 tabs every Friday 5 tabs on Sunday, Disp: , Rfl:  .  nystatin (MYCOSTATIN) 100000 UNIT/ML suspension, Take 5 mLs (500,000 Units total) by mouth 4 (four) times daily., Disp: 160 mL, Rfl: 0 .  predniSONE (DELTASONE) 1 MG tablet, Take 2.5 mg by mouth daily., Disp: , Rfl: 2 .  rosuvastatin (CRESTOR) 20 MG tablet, Take 10 mg every evening by mouth. , Disp: , Rfl:  .  Tiotropium Bromide Monohydrate (SPIRIVA RESPIMAT) 1.25 MCG/ACT AERS, Inhale 2 puffs into the lungs daily., Disp: , Rfl:  .  valACYclovir (VALTREX) 500 MG tablet, Take 500 mg 2 (two) times daily by mouth., Disp: , Rfl:  .  zoledronic acid (RECLAST) 5 MG/100ML SOLN, Inject 5 mg into the vein once. Every 2 years , Disp: , Rfl:  .  levofloxacin (LEVAQUIN) 500 MG tablet, TK 1 T PO QD FOR 7 DAYS, Disp: , Rfl: 0 .  pantoprazole (PROTONIX) 40 MG tablet, Take 1 tablet (40 mg total) by mouth daily. (Patient not taking: Reported on 06/09/2018), Disp: 30 tablet, Rfl: 0:  Allergies  Allergen Reactions  . Other     "CILLIN" Family  Has patient had a PCN reaction causing immediate rash, facial/tongue/throat swelling, SOB or lightheadedness with hypotension: no Has patient had a PCN reaction causing severe rash involving mucus membranes or skin necrosis: yes Has patient had a PCN reaction that required hospitalization: no Has patient  had a PCN reaction occurring within the last 10 years: no If all of the above answers are "NO", then may proceed with Cephalosporin use.   . Aspirin Other (See Comments)    Gi upset  . Naproxen Sodium Other (See Comments)    Whelps in large doses **ALEVE**  :  Family History  Problem Relation Age of Onset  . Lung cancer Father   . Asthma Paternal Grandmother   . Lung cancer Paternal Aunt   . Breast cancer Maternal Aunt   . Breast cancer Maternal Aunt   . Stomach cancer Maternal Uncle   . Colon cancer  Neg Hx   :  Social History   Socioeconomic History  . Marital status: Married    Spouse name: Mary  . Number of children: 1  . Years of education: Not on file  . Highest education level: Not on file  Occupational History  . Occupation: Civil engineer, contracting: UNEMPLOYED  Social Needs  . Financial resource strain: Not on file  . Food insecurity:    Worry: Not on file    Inability: Not on file  . Transportation needs:    Medical: Not on file    Non-medical: Not on file  Tobacco Use  . Smoking status: Current Every Day Smoker    Packs/day: 0.50    Years: 44.00    Pack years: 22.00    Types: Cigarettes  . Smokeless tobacco: Never Used  Substance and Sexual Activity  . Alcohol use: No  . Drug use: No  . Sexual activity: Not on file  Lifestyle  . Physical activity:    Days per week: Not on file    Minutes per session: Not on file  . Stress: Not on file  Relationships  . Social connections:    Talks on phone: Not on file    Gets together: Not on file    Attends religious service: Not on file    Active member of club or organization: Not on file    Attends meetings of clubs or organizations: Not on file    Relationship status: Not on file  . Intimate partner violence:    Fear of current or ex partner: Not on file    Emotionally abused: Not on file    Physically abused: Not on file    Forced sexual activity: Not on file  Other Topics Concern  . Not  on file  Social History Narrative   Married since 1974   2 years at [facility] [employer]  :  Pertinent items are noted in HPI.  Exam: Blood pressure (!) 159/80, pulse 86, temperature 99.4 F (37.4 C), temperature source Oral, resp. rate (!) 23, height '5\' 3"'  (1.6 m), weight 148 lb 9.4 oz (67.4 kg), SpO2 97 %.  ECOG 1 General appearance: alert and cooperative appeared chronically ill appearing. Head: atraumatic without any abnormalities. Eyes: conjunctivae/corneas clear. PERRL.  Sclera anicteric. Throat: lips, mucosa, and tongue normal; without oral thrush or ulcers. Resp: clear to auscultation bilaterally without rhonchi, wheezes or dullness to percussion. Cardio: regular rate and rhythm, S1, S2 normal, no murmur, click, rub or gallop GI: soft, non-tender; bowel sounds normal; no masses,  no organomegaly Skin: Skin color, texture, turgor normal. No rashes or lesions Lymph nodes: Cervical, supraclavicular, and axillary nodes normal. Neurologic: Grossly normal without any motor, sensory or deep tendon reflexes. Musculoskeletal: No joint deformity or effusion.  Recent Labs    06/09/18 1618  WBC 1.6*  HGB 9.6*  HCT 30.3*  PLT 10*   Recent Labs    06/09/18 1618  NA 140  K 4.2  CL 104  CO2 27  GLUCOSE 120*  BUN 23  CREATININE 1.25*  CALCIUM 8.9     Blood smear review: Peripheral smear was personally reviewed today and showed macrocytosis but no evidence of schistocytes or red cell fragments.  Platelets are decreased.  Neutrophils were noted somewhat dysplastic and hypersegmented neutrophils were noted.   Assessment and Plan:   72 year old man with the following:  1.  Pancytopenia detected acutely with normal CBC in May 2019.  He has long-standing history of  microcytosis dating back to 2017 with fluctuating thrombocytopenia noted as low as 66 back in 2017.  This is noting in the setting of history of rheumatoid arthritis and chronic use of methotrexate.  The differential diagnosis was  discussed today with the patient and his son.  This would include severe vitamin Z89 and folic acid deficiency, methotrexate toxicity, myelodysplastic syndrome or acute leukemia.  Conditions such as TTP, ITP, HUS or DIC are unlikely.  To work this up completely, I recommend obtaining full anemia work-up including iron studies, N73, folic acid, reticulocyte count, LDH, serum protein electrophoresis, Coombs test, PTT level, methotrexate level.    If his work-up is unrevealing based on these findings, he will require a bone marrow biopsy to evaluate for acute leukemia.  This was discussed in detail with the patient and his family.  2.  Thrombocytopenia: This is related to the acute hematological issue he is experiencing.  Given his risk of bleeding, I recommend 1 unit of platelet transfusion with 1 hour post transfusion CBC to ensure adequate response to platelet transfusion.  3.  History of DVT: I recommend holding his anticoagulation given his increased risk of bleeding.    We will continue to follow him while he is hospitalized and will give further recommendation pending the results of his work-up.  80  minutes was on the floor as well as face-to-face with the patient today.  More than 50% of time was dedicated to reviewing his medical records, laboratory data for the last 2 years, discussing differential diagnosis and coordinating his plan of care.

## 2018-06-09 NOTE — H&P (Addendum)
KNIGHT OELKERS MIW:[id number] DOB: 28-Aug-1946 DOA: 06/09/2018     PCP: [name], MD  [place] today. [facility]     Rad On Dr. Sondra Come Patient arrived to ER on 06/09/18 at 1510  Patient coming from:  home Lives   With family    Chief Complaint:  Chief Complaint  Patient presents with  . Abnormal Lab    low platelets    HPI: Kenneth [name] is a 73 y.o. male with medical history significant of Lung CA, hx of DVT, depression HLD, RA, tobacco abuse, anemia due to folic acid deficiency, B12 deficiency COPD    Presented with   Abnormal labs from PCP office today was told has plt of 12.  Presented today to PCP view of leg pain and right leg swelling and pain with palpation for the past 2 days. Noted his temperature was 99 states that its high for him. Patient took Tylenol. Today he presented to PCP office. Patient had Dopplers done showed no evidence of DVT routine labs were done showing WBC 2.4 Hg 10.1 plt 12  2 weeks ago had a nose bleed.  He does endorse some weight loss. he os on Eliquis for hx of prior DVT On methotrexate history of rheumatoid arthritis and has increased in methotrexate dose in the past 3 months has history of B12 deficiency stem check in May was within normal limits Reports loosing 6 lb over past 3 weeks. Food does not taste good. Reports dyspnea with exertion but no chest pain no fever, diffuse fatigue today had to use a wheelchair at baseline able to walk on his own.  Otherwise no easy bruising no petechia no hemoptysis no blood per rectum no seizures or syncope.  No nausea vomiting or diarrhea    Regarding hx of Lung Can followed by Dr. Julien Nordmann radiation therapy finished in January.  While in ER: Hematology oncology came down to evaluate the patient. Dr. Alen Blew is aware commend admitted to [facility] for further work-up  The following Work up has been ordered so far:  Orders Placed This Encounter  Procedures  . Basic metabolic  panel  . CBC  . Protime-INR  . CBC with Differential  . Differential  . Vitamin B12  . Folate  . Iron and TIBC  . Ferritin  . Reticulocytes  . Lactate dehydrogenase  . APTT  . Protein electrophoresis, serum  . Hepatic function panel  . Magnesium  . Phosphorus  . Consult to hematology  . Consult to hospitalist  . Type and screen [facility]  . ABO/Rh  . Direct antiglobulin test (not at [facility])  . Saline lock IV    Following Medications were ordered in ER: Medications  HYDROcodone-acetaminophen (NORCO/VICODIN) 5-325 MG per tablet 1 tablet (1 tablet Oral Given 06/09/18 1645)    Significant initial  Findings: Abnormal Labs Reviewed  BASIC METABOLIC PANEL - Abnormal; Notable for the following components:      Result Value   Glucose, Bld 120 (*)    Creatinine, Ser 1.25 (*)    GFR calc non Af Amer 56 (*)    All other components within normal limits  CBC - Abnormal; Notable for the following components:   WBC 1.6 (*)    RBC 2.81 (*)    Hemoglobin 9.6 (*)    HCT 30.3 (*)    MCV 107.8 (*)    MCH 34.2 (*)    Platelets 10 (*)    All other components  within normal limits     Na 140 K 4.2  Cr   stable,    Lab Results  Component Value Date   CREATININE 1.25 (H) 06/09/2018   CREATININE 1.19 03/02/2018   CREATININE 1.37 (H) 11/24/2017      WBC  1.6  HG/HCT   Down from baseline see below    Component Value Date/Time   HGB 9.6 (L) 06/09/2018 1618   HGB 13.2 03/02/2018 1206   HGB 13.7 08/08/2017 1247   HCT 30.3 (L) 06/09/2018 1618   HCT 40.9 08/08/2017 1247     Lactic Acid, Venous    Component Value Date/Time   LATICACIDVEN 1.72 04/29/2016 1310   INR 1.13   UA  ordered   ECG:  Not obtained   ED Triage Vitals  Enc Vitals Group     BP 06/09/18 1540 (!) 144/81     Pulse Rate 06/09/18 1540 99     Resp 06/09/18 1540 18     Temp 06/09/18 1540 99.4 F (37.4 C)     Temp Source 06/09/18 1540 Oral     SpO2 06/09/18 1540 95 %     Weight  06/09/18 1648 148 lb 9.4 oz (67.4 kg)     Height 06/09/18 1648 '5\' 3"'  (1.6 m)     Head Circumference --      Peak Flow --      Pain Score 06/09/18 1540 0     Pain Loc --      Pain Edu? --      Excl. in [place]? --   TMAX(24)@       Latest  Blood pressure (!) 159/80, pulse 86, temperature 99.4 F (37.4 C), temperature source Oral, resp. rate (!) 23, height '5\' 3"'  (1.6 m), weight 67.4 kg, SpO2 97 %.    ER Provider Called:   Oncology Dr. Alen Blew patient in the ER reviewed smear at this point no evidence of blast crisis or schistocytes They Recommend admit to [facility] for further work-up no need to transfer to [facility] at this point  Recommend obtain: anemia work-up including iron studies, N05, folic acid, reticulocyte count, LDH, serum protein electrophoresis, Coombs test, PTT level, methotrexate level.   If above-mentioned work-up and is unremarkable may need bone marrow biopsy on Monday.  For now transfuse platelets.  We will continue to follow  Hospitalist was called for admission for pancytopenia   Review of Systems:    Pertinent positives include:  fatigue, weight loss dyspnea on exertion,   Constitutional:  No weight loss, night sweats, Fevers, chills, HEENT:  No headaches, Difficulty swallowing,Tooth/dental problems,Sore throat,  No sneezing, itching, ear ache, nasal congestion, post nasal drip,  Cardio-vascular:  No chest pain, Orthopnea, PND, anasarca, dizziness, palpitations.no Bilateral lower extremity swelling  GI:  No heartburn, indigestion, abdominal pain, nausea, vomiting, diarrhea, change in bowel habits, loss of appetite, melena, blood in stool, hematemesis Resp:  no shortness of breath at rest. No No excess mucus, no productive cough, No non-productive cough, No coughing up of blood.No change in color of mucus.No wheezing. Skin:  no rash or lesions. No jaundice GU:  no dysuria, change in color of urine, no urgency or frequency. No straining to urinate.  No  flank pain.  Musculoskeletal:  No joint pain or no joint swelling. No decreased range of motion. No back pain.  Psych:  No change in mood or affect. No depression or anxiety. No memory loss.  Neuro: no localizing neurological complaints, no tingling, no weakness,  no double vision, no gait abnormality, no slurred speech, no confusion  All systems reviewed and apart from [place] all are negative  Past Medical History:   Past Medical History:  Diagnosis Date  . Abnormal LFTs   . Anxiety   . COPD (chronic obstructive pulmonary disease) (HCC)    emphysema.  . CTS (carpal tunnel syndrome)    Left  . DDD (degenerative disc disease)   . Depression    mild  . Elevated MCV   . Emphysema of lung ([place])   . Gout   . Gynecomastia   . Hearing loss   . History of radiation therapy 10/17/17-10/24/17   left lung treated to 54 Gy in 3 fractions  . Hyperlipidemia   . Microhematuria   . Nephrolithiasis   . Ocular migraine   . Panic attack   . Rheumatoid arthritis(714.0) dx 2000  . Situational hypertension   . Tobacco abuse       Past Surgical History:  Procedure Laterality Date  . APPENDECTOMY    . COLONOSCOPY    . CYSTOSTOMY W/ BLADDER BIOPSY  2006  . ESOPHAGOGASTRODUODENOSCOPY N/A 04/30/2016   Procedure: ESOPHAGOGASTRODUODENOSCOPY (EGD);  Surgeon: Doran Stabler, MD;  Location: [facility];  Service: Endoscopy;  Laterality: N/A;    Social History:  Ambulatory   independently       reports that he has been smoking cigarettes. He has a 22.00 pack-year smoking history. He has never used smokeless tobacco. He reports that he does not drink alcohol or use drugs.     Family History:   Family History  Problem Relation Age of Onset  . Lung cancer Father   . Asthma Paternal Grandmother   . Lung cancer Paternal Aunt   . Breast cancer Maternal Aunt   . Breast cancer Maternal Aunt   . Stomach cancer Maternal Uncle   . Colon cancer Neg Hx     Allergies: Allergies  Allergen  Reactions  . Other     "CILLIN" Family  Has patient had a PCN reaction causing immediate rash, facial/tongue/throat swelling, SOB or lightheadedness with hypotension: no Has patient had a PCN reaction causing severe rash involving mucus membranes or skin necrosis: yes Has patient had a PCN reaction that required hospitalization: no Has patient had a PCN reaction occurring within the last 10 years: no If all of the above answers are "NO", then may proceed with Cephalosporin use.   . Aspirin Other (See Comments)    Gi upset  . Naproxen Sodium Other (See Comments)    Whelps in large doses **ALEVE**     Prior to Admission medications   Medication Sig Start Date End Date Taking? Authorizing Provider  acetaminophen (TYLENOL) 325 MG tablet Take 325 mg by mouth every 4 (four) hours as needed for mild pain.    Yes [provider]  amLODipine (NORVASC) 5 MG tablet Take 1 tablet daily by mouth. 08/06/17  Yes [provider]  apixaban (ELIQUIS) 2.5 MG TABS tablet Take 2.5 mg by mouth daily.    Yes [provider]  budesonide-formoterol (SYMBICORT) 80-4.5 MCG/ACT inhaler Inhale 2 puffs into the lungs 2 (two) times daily.   Yes [provider]  cholecalciferol (VITAMIN D) 1000 UNITS tablet Take 1,000 Units by mouth daily.     Yes [provider]  clonazePAM (KLONOPIN) 0.5 MG tablet Take 0.5 mg by mouth 2 (two) times daily as needed for anxiety.    Yes [provider]  Dextromethorphan-Guaifenesin (TUSSIN DM  PO) Take 1 tablet by mouth every 6 (six) hours as needed (cough and cold).    Yes [provider]  fluticasone (FLONASE) 50 MCG/ACT nasal spray Place 2 sprays into the nose daily.     Yes [provider]  folic acid (FOLVITE) 1 MG tablet Take 1 mg by mouth daily.     Yes [provider]  HYDROcodone-acetaminophen (NORCO/VICODIN) 5-325 MG tablet Take 1 tablet every 6 (six) hours as needed by mouth for moderate pain.    Yes [provider]  methotrexate (RHEUMATREX) 2.5 MG tablet Take 12.5 mg by mouth 2 (two) times a week. Take 5 tabs every Friday 5 tabs on Sunday   Yes [provider]  nystatin (MYCOSTATIN) 100000 UNIT/ML suspension Take 5 mLs (500,000 Units total) by mouth 4 (four) times daily. 05/01/16  Yes Ikramullah, Mir Mohammed, MD  predniSONE (DELTASONE) 1 MG tablet Take 2.5 mg by mouth daily. 05/18/18  Yes [provider]  rosuvastatin (CRESTOR) 20 MG tablet Take 10 mg every evening by mouth.    Yes [provider]  Tiotropium Bromide Monohydrate (SPIRIVA RESPIMAT) 1.25 MCG/ACT AERS Inhale 2 puffs into the lungs daily.   Yes [provider]  valACYclovir (VALTREX) 500 MG tablet Take 500 mg 2 (two) times daily by mouth.   Yes [provider]  zoledronic acid (RECLAST) 5 MG/100ML SOLN Inject 5 mg into the vein once. Every 2 years    Yes [provider]  levofloxacin (LEVAQUIN) 500 MG tablet TK 1 T PO QD FOR 7 DAYS 11/18/17   [provider]  pantoprazole (PROTONIX) 40 MG tablet Take 1 tablet (40 mg total) by mouth daily. Patient not taking: Reported on 06/09/2018 05/01/16   Tomma Rakers, MD   Physical Exam: Blood pressure (!) 159/80, pulse 86, temperature 99.4 F (37.4 C), temperature source Oral, resp. rate (!) 23, height '5\' 3"'  (1.6 m), weight 67.4 kg, SpO2 97 %. 1. General:  in No Acute distress   Chronically ill cachectic  -appearing 2. Psychological: Alert and   Oriented 3. Head/ENT:    Dry Mucous Membranes                          Head Non traumatic, neck supple                           Poor Dentition 4. SKIN:   decreased Skin turgor,  Skin clean Dry and intact rednessand warmth over right leg noted    5. Heart: Regular rate and rhythm no  Murmur, no Rub or gallop 6. Lungs:  Clear to auscultation bilaterally, no wheezes or crackles   7. Abdomen: Soft,  non-tender, Non distended bowel sounds present 8. Lower  extremities: no clubbing, cyanosis, or  edema 9. Neurologically Grossly intact, moving all 4 extremities equally   10. MSK: Normal range of motion   LABS:     Recent Labs  Lab 06/09/18 1618  WBC 1.6*  HGB 9.6*  HCT 30.3*  MCV 107.8*  PLT 10*   Basic Metabolic Panel: Recent Labs  Lab 06/09/18 1618  NA 140  K 4.2  CL 104  CO2 27  GLUCOSE 120*  BUN 23  CREATININE 1.25*  CALCIUM 8.9      No results for input(s): AST, ALT, ALKPHOS, BILITOT, PROT, ALBUMIN in the last 168 hours. No results for input(s): LIPASE, AMYLASE in the last 168 hours.  No results for input(s): AMMONIA in the last 168 hours.    HbA1C: No results for input(s): HGBA1C in the last 72 hours. CBG: No results for input(s): GLUCAP in the last 168 hours.    Urine analysis:    Component Value Date/Time   COLORURINE YELLOW 04/29/2016 1308   APPEARANCEUR CLOUDY (A) 04/29/2016 1308   LABSPEC 1.025 04/29/2016 1308   PHURINE 6.0 04/29/2016 1308   GLUCOSEU NEGATIVE 04/29/2016 1308   HGBUR LARGE (A) 04/29/2016 1308   BILIRUBINUR NEGATIVE 04/29/2016 1308   KETONESUR NEGATIVE 04/29/2016 1308   PROTEINUR >300 (A) 04/29/2016 1308   NITRITE NEGATIVE 04/29/2016 1308   LEUKOCYTESUR NEGATIVE 04/29/2016 1308       Cultures:    Component Value Date/Time   SDES THROAT 04/29/2016 1311   SPECREQUEST NONE Reflexed from [id number] 04/29/2016 1311   CULT  04/29/2016 1311    NO GROUP A STREP (S.PYOGENES) ISOLATED Performed at [facility] 05/02/2016 FINAL 04/29/2016 1311     Radiological Exams on Admission: No results found.  Chart has been reviewed    Assessment/Plan  72 y.o. male with medical history significant of Lung CA, hx of DVT, depression HLD, RA, tobacco abuse, anemia due to folic acid deficiency, B12 deficiency COPD Admitted for pancytopenia of unclear etiology Present on Admission: . Pancytopenia (HCC)-will undergo hematological work-up  -LDH 200   elevated  -Anemia panel  so far pending but does show low folic acid patient already on supplementation but will likely need higher dose Await results of B12 results -Iron studies  -Serum electrophoresis  -Haptoglobin  -Coombs test negative -Blood smear done by oncology showing no blasts  or schistocytes to suggest TTP or blast crisis  -Methotrexate level ordered  -Bilirubin within normal limits no evidence of hemolysis Appreciate oncology consult if hematological malignancy still suspected may need bone marrow biopsy on Monday otherwise we will treat underlying cause No recent viral symptoms to suggest viral induced pancytopenia  Thrombocytopenia-  we will transfuse 1 unit and recheck CBC make sure patient responded appropriately . Non-small cell cancer of left lung [facility]) oncology is following status post treatment for radiation therapy . Protein-calorie malnutrition, severe decreased p.o. intake and weight loss order nutritional consult . HLD (hyperlipidemia) stable continue medications . Rheumatoid arthritis (HCC) hold methotrexate check methotrexate level . Cellulitis of right leg mild will cover with ceftriaxone for now obtain MRSA and plain imaging given her immunocompromise state order blood cultures given the low-grade fever at home Dehydration with poor p.o. intake will order IV fluids nutritional consult to help with nutritional management History of DVT hold Eliquis given significant thrombocytopenia and risk of bleeding Other plan as per orders.  DVT prophylaxis:  SCD      Code Status:    DNR/DNI  as per patient  I had personally discussed CODE STATUS with patient    Family Communication:   Family  at  Bedside  plan of care was discussed with   Son,   Disposition Plan:    To home once workup is complete and patient is stable                     Would benefit from PT/OT eval prior to DC  Ordered                                       Nutrition  consulted                                   Consults called: Oncology    Admission status:    inpatient   given complexity and severity of multiple medical problems patient is at risk of death given the event acute leukemia is on differential.  Requires inpatient work-up with frequent monitoring blood products transfusion IV antibiotics for underlying infection secondary to possibility of bacteremia with poor p.o. intake   Level of care       medical floor      Toy Baker 06/09/2018, 8:59 PM   [facility]  Pager [phone]   after 2 AM please page floor coverage PA If 7AM-7PM, please contact the day team taking care of the patient  [email or web address]  Password [email or web address]

## 2018-06-09 NOTE — ED Notes (Signed)
RN notified of abnormal lab 

## 2018-06-10 DIAGNOSIS — R1310 Dysphagia, unspecified: Secondary | ICD-10-CM

## 2018-06-10 DIAGNOSIS — R110 Nausea: Secondary | ICD-10-CM

## 2018-06-10 LAB — COMPREHENSIVE METABOLIC PANEL
ALBUMIN: 2.7 g/dL — AB (ref 3.5–5.0)
ALT: 18 U/L (ref 0–44)
AST: 20 U/L (ref 15–41)
Alkaline Phosphatase: 65 U/L (ref 38–126)
Anion gap: 8 (ref 5–15)
BUN: 22 mg/dL (ref 8–23)
CHLORIDE: 106 mmol/L (ref 98–111)
CO2: 26 mmol/L (ref 22–32)
Calcium: 8.4 mg/dL — ABNORMAL LOW (ref 8.9–10.3)
Creatinine, Ser: 1.11 mg/dL (ref 0.61–1.24)
GFR calc Af Amer: >60 mL/min (ref >60)
Glucose, Bld: 93 mg/dL (ref 70–99)
POTASSIUM: 4.1 mmol/L (ref 3.5–5.1)
SODIUM: 140 mmol/L (ref 135–145)
Total Bilirubin: 0.5 mg/dL (ref 0.3–1.2)
Total Protein: 6 g/dL — ABNORMAL LOW (ref 6.5–8.1)

## 2018-06-10 LAB — PHOSPHORUS: PHOSPHORUS: 3.7 mg/dL (ref 2.5–4.6)

## 2018-06-10 LAB — ABO/RH: ABO/RH(D): AB POS

## 2018-06-10 LAB — URINALYSIS, ROUTINE W REFLEX MICROSCOPIC
BACTERIA UA: NONE SEEN
Bilirubin Urine: NEGATIVE
Glucose, UA: NEGATIVE mg/dL
Ketones, ur: NEGATIVE mg/dL
Leukocytes, UA: NEGATIVE
NITRITE: NEGATIVE
PROTEIN: NEGATIVE mg/dL
SPECIFIC GRAVITY, URINE: 1.024 (ref 1.005–1.030)
pH: 5 (ref 5.0–8.0)

## 2018-06-10 LAB — CBC
HEMATOCRIT: 23.7 % — AB (ref 39.0–52.0)
Hemoglobin: 7.8 g/dL — ABNORMAL LOW (ref 13.0–17.0)
MCH: 35.5 pg — AB (ref 26.0–34.0)
MCHC: 32.9 g/dL (ref 30.0–36.0)
MCV: 107.7 fL — AB (ref 78.0–100.0)
Platelets: 28 10*3/uL — CL (ref 150–400)
RBC: 2.2 MIL/uL — ABNORMAL LOW (ref 4.22–5.81)
RDW: 13.5 % (ref 11.5–15.5)
WBC: 2 10*3/uL — AB (ref 4.0–10.5)

## 2018-06-10 LAB — PREPARE RBC (CROSSMATCH)

## 2018-06-10 LAB — MAGNESIUM: Magnesium: 2.1 mg/dL (ref 1.7–2.4)

## 2018-06-10 LAB — TSH: TSH: 0.522 u[IU]/mL (ref 0.350–4.500)

## 2018-06-10 LAB — HEMOGLOBIN AND HEMATOCRIT, BLOOD
HCT: 19 % — ABNORMAL LOW (ref 39.0–52.0)
HEMATOCRIT: 29.2 % — AB (ref 39.0–52.0)
HEMOGLOBIN: 9.3 g/dL — AB (ref 13.0–17.0)
Hemoglobin: 6.6 g/dL — CL (ref 13.0–17.0)

## 2018-06-10 LAB — SODIUM, URINE, RANDOM: SODIUM UR: 94 mmol/L

## 2018-06-10 LAB — MRSA PCR SCREENING: MRSA BY PCR: NEGATIVE

## 2018-06-10 LAB — CREATININE, URINE, RANDOM: Creatinine, Urine: 160.93 mg/dL

## 2018-06-10 MED ORDER — VITAMIN B-12 1000 MCG PO TABS
1000.0000 ug | ORAL_TABLET | Freq: Every day | ORAL | Status: DC
  Administered 2018-06-10 – 2018-06-15 (×6): 1000 ug via ORAL
  Filled 2018-06-10 (×6): qty 1

## 2018-06-10 MED ORDER — FOLIC ACID 1 MG PO TABS
5.0000 mg | ORAL_TABLET | Freq: Every day | ORAL | Status: DC
  Administered 2018-06-11 – 2018-06-15 (×5): 5 mg via ORAL
  Filled 2018-06-10 (×7): qty 5

## 2018-06-10 MED ORDER — SODIUM CHLORIDE 0.9% IV SOLUTION: Freq: Once | INTRAVENOUS | Status: DC

## 2018-06-10 MED ORDER — FOLIC ACID 1 MG PO TABS
4.0000 mg | ORAL_TABLET | Freq: Once | ORAL | Status: AC
  Administered 2018-06-10: 4 mg via ORAL
  Filled 2018-06-10: qty 4

## 2018-06-10 MED ORDER — SODIUM CHLORIDE 0.9 % IV SOLN
INTRAVENOUS | Status: DC
  Administered 2018-06-10 (×2): via INTRAVENOUS

## 2018-06-10 NOTE — Progress Notes (Signed)
Hemoglobin 7.8 notified X. Blount as per MD order if hemoglobin is less than 8.

## 2018-06-10 NOTE — Evaluation (Signed)
Occupational Therapy Evaluation Patient Details Name: Kenneth [name] MRN: [id number] DOB: [birth year] Today's Date: 06/10/2018    History of Present Illness Pt was admitted with pantocyopenia from MD office.  He has had increased fatique and DOE.  PMH:  NSCLC, CAD, RA, and COPD   Clinical Impression   This 72 year old man was admitted for the above. At baseline, he is mod I with adls. He has had increased fatique and has done the best he can. He finds meal prep difficult, sponge bathes as bed/bath are upstairs and RA interferes with his donning compression hose. He reports that his wife has Leukemia.  Will follow in acute setting with mod I level goals.  He needs mostly set up to min guard assist at this time for basic ADLs    Follow Up Recommendations  (HHOT vs no follow up)    Equipment Recommendations  None recommended by OT    Recommendations for Other Services       Precautions / Restrictions Precautions Precautions: Fall Restrictions Weight Bearing Restrictions: No      Mobility Bed Mobility Overal bed mobility: Modified Independent             General bed mobility comments: HOB raised  Transfers Overall transfer level: Needs assistance Equipment used: None Transfers: Sit to/from Stand Sit to Stand: Supervision              Balance Overall balance assessment: Mild deficits observed, not formally tested                                         ADL either performed or assessed with clinical judgement   ADL Overall ADL's : Needs assistance/impaired                         Toilet Transfer: Min guard;Stand-pivot(to chair)             General ADL Comments: set up/supervison for adl. Pt is able to cross legs. Energy conservation education begun     Vision         Perception     Praxis      Pertinent Vitals/Pain Pain Assessment: No/denies pain(has typically had low back and hips)     Hand Dominance      Extremity/Trunk Assessment Upper Extremity Assessment Upper Extremity Assessment: Overall WFL for tasks assessed(bil RA changes)           Communication Communication Communication: No difficulties   Cognition Arousal/Alertness: Awake/alert Behavior During Therapy: WFL for tasks assessed/performed Overall Cognitive Status: Within Functional Limits for tasks assessed                                     General Comments       Exercises     Shoulder Instructions      Home Living Family/patient expects to be discharged to:: Private residence Living Arrangements: Spouse/significant other   Type of Home: House Home Access: Stairs to enter [employer] of Steps: 2   Home Layout: Able to live on main level with bedroom/bathroom         Bathroom Toilet: Standard         Additional Comments: pt's wife has Leukemia. She has a shower with seat but is the only one  to use it.  Pt sponge bathes at sink in his great room (converted garage) and sleeps on the couch there. His bed/bath (tub) is upstairs      Prior Functioning/Environment Level of Independence: Independent        Comments: mod I. Does the best he can.  Can no longer get compression hose on        OT Problem List: Decreased strength;Decreased activity tolerance;Impaired balance (sitting and/or standing);Pain      OT Treatment/Interventions: Self-care/ADL training;DME and/or AE instruction;Energy conservation;Patient/family education;Balance training    OT Goals(Current goals can be found in the care plan section) Acute Rehab OT Goals Patient Stated Goal: none stated; agreeable to OT evaluation OT Goal Formulation: With patient Time For Goal Achievement: 06/24/18 Potential to Achieve Goals: Good ADL Goals Pt Will Transfer to Toilet: with modified independence;regular height toilet;ambulating Additional ADL Goal #1: pt will complete adl at mod I level, extra time Additional ADL  Goal #2: pt will verbalize 3 energy conservation strategies  OT Frequency: Min 2X/week   Barriers to D/C:            Co-evaluation              AM-PAC PT "6 Clicks" Daily Activity     Outcome Measure Help from another person eating meals?: None Help from another person taking care of personal grooming?: A Little Help from another person toileting, which includes using toliet, bedpan, or urinal?: A Little Help from another person bathing (including washing, rinsing, drying)?: A Little Help from another person to put on and taking off regular upper body clothing?: A Little Help from another person to put on and taking off regular lower body clothing?: A Little 6 Click Score: 19   End of Session Nurse Communication: Mobility status(Pt going to try to sit up 30 minutes)  Activity Tolerance: Patient limited by fatigue Patient left: in chair;with call bell/phone within reach;with chair alarm set  OT Visit Diagnosis: Muscle weakness (generalized) (M62.81)                Time: 0623-7628 OT Time Calculation (min): 22 min Charges:  OT General Charges $OT Visit: 1 Visit OT Evaluation $OT Eval Low Complexity: 1 Low  [place], OTR/L [phone] 06/10/2018  Kenneth [name] 06/10/2018, 4:02 PM

## 2018-06-10 NOTE — Progress Notes (Signed)
IP PROGRESS NOTE  Subjective:   Kenneth [name] reports no major complaints overnight.  He still overall weak but denies any bleeding, cough or shortness of breath.  He he reports some nausea and difficulty swallowing at times.  He has not been taking folic acid because of it.  He denies any hematochezia or melena.  He denies any fevers or chills.  He does not report any headaches, blurry vision, syncope or seizures. Does not report any fevers, chills or sweats.  Does not report any cough, wheezing or hemoptysis.  Does not report any chest pain, palpitation, orthopnea or leg edema.  Does not report any constipation or diarrhea.  Does not report any skeletal complaints.    Does not report frequency, urgency or hematuria.  Does not report any skin rashes or lesions. Does not report any lymphadenopathy or petechiae.  Does not report any anxiety or depression.  Remaining review of systems is negative.    Objective:  Vital signs in last 24 hours: Temp:  [97.8 F (36.6 C)-99.4 F (37.4 C)] 98.5 F (36.9 C) (08/24 0528) Pulse Rate:  [66-108] 72 (08/24 0528) Resp:  [15-36] 15 (08/24 0528) BP: (118-179)/(62-101) 118/67 (08/24 0528) SpO2:  [94 %-99 %] 96 % (08/24 0528) Weight:  [148 lb 9.4 oz (67.4 kg)] 148 lb 9.4 oz (67.4 kg) (08/23 1648) Weight change:     Intake/Output from previous day: 08/23 0701 - 08/24 0700 In: 1051.5 [I.V.:396.2; Blood:542; IV Piggyback:113.4] Out: -  General: Alert, awake without distress. Head: Normocephalic atraumatic. Mouth: mucous membranes moist, pharynx normal without lesions Eyes: No scleral icterus.  Pupils are equal and round reactive to light. Resp: clear to auscultation bilaterally without rhonchi or wheezes or dullness to percussion. Cardio: regular rate and rhythm, S1, S2 normal, no murmur, click, rub or gallop GI: soft, non-tender; bowel sounds normal; no masses,  no organomegaly Musculoskeletal: No joint deformity or effusion. Neurological: No motor,  sensory deficits.  Intact deep tendon reflexes. Skin: Ecchymosis noted on his arms.   Lab Results: Recent Labs    06/09/18 1618 06/10/18 0406 06/10/18 0555  WBC 1.6* 2.0*  --   HGB 9.6* 7.8* 6.6*  HCT 30.3* 23.7* 19.0*  PLT 10* 28*  --     BMET Recent Labs    06/09/18 1618 06/10/18 0406  NA 140 140  K 4.2 4.1  CL 104 106  CO2 27 26  GLUCOSE 120* 93  BUN 23 22  CREATININE 1.25* 1.11  CALCIUM 8.9 8.4*  Results for Kenneth [name], Kenneth [name] (MRN [id number]) as of 06/10/2018 08:05  Ref. Range 06/09/2018 16:17  Iron Latest Ref Range: 45 - 182 ug/dL 98  UIBC Latest Units: ug/dL 139  TIBC Latest Ref Range: 250 - 450 ug/dL 237 (L)  Saturation Ratios Latest Ref Range: 17.9 - 39.5 % 41 (H)  Ferritin Latest Ref Range: 24 - 336 ng/mL 709 (H)  Folate Latest Ref Range: >5.9 ng/mL 4.5 (L)   Results for Kenneth [name], Kenneth [name] (MRN [id number]) as of 06/10/2018 08:05  Ref. Range 06/09/2018 16:17 06/09/2018 16:18 06/09/2018 20:30  Fibrinogen Latest Ref Range: 210 - 475 mg/dL   755 (H)  Prothrombin Time Latest Ref Range: 11.4 - 15.2 seconds  14.4   INR Unknown  1.13   APTT Latest Ref Range: 24 - 36 seconds 39 (H)      Studies/Results: Dg Tibia/fibula Right  Result Date: 06/09/2018 CLINICAL DATA:  72 y/o  M; anterior leg pain. EXAM: RIGHT TIBIA AND FIBULA - 2 VIEW  COMPARISON:  None. FINDINGS: There is no evidence of fracture or other focal bone lesions. Vascular calcifications noted. IMPRESSION: Negative. Electronically Signed   By: Kristine Garbe M.D.   On: 06/09/2018 21:41    Medications: I have reviewed the patient's current medications.  Assessment/Plan:  1.  Pancytopenia: Detected acutely in August 2019 after presenting with white cell count of 1.6, platelet count of 10 and hemoglobin have drifted down to 6.6.  His work-up is currently ongoing but does document folic acid deficiency.  He has been on methotrexate with increasing doses as of late although he has not been taking folic acid  regularly.  Deficiency of folic acid and I37 can result in a similar picture including pancytopenia, hypersegmented neutrophils and microcytosis.  Other etiologies are not completely ruled out which include methotrexate toxicity as well as bone marrow process such as acute leukemia.  I recommend starting folic acid oral replacement at 5 mg daily with vitamin B12 injections as well.  We will await the results of his methotrexate level to ensure nontoxic levels.  I have asked him to continue to hold methotrexate for the time being.  I anticipate he would likely require a bone marrow biopsy in any case next week.   2.  Anemia: Microcytic in nature with hypersegmented neutrophils in the peripheral smear.  I agree with 2 units of packed red cell transfusion at this time.  The etiology of his anemia is related to the same process resulting in pancytopenia.   3.  Thrombocytopenia: He is post transfusion platelet count responded adequately.  His DIC panel is negative and his peripheral smear did not show any schistocytes to support TTP, DIC or HUS.   We will continue to follow with you during his hospitalization.  30  minutes was spent on the floor as well as face-to-face with the patient today.  More than 50% of time was dedicated to discussing his laboratory data, reviewing differential diagnosis and outlining treatment plan.       LOS: 1 day   Zola Button 06/10/2018, 8:02 AM

## 2018-06-10 NOTE — Progress Notes (Signed)
PROGRESS NOTE  Kenneth [name] HQP:[id number] DOB: 01-22-1946 DOA: 06/09/2018 PCP: [name], MD  HPI/Recap of past 40 hours: 72 year old male with medical history significant for long CAD, history of DVT, rheumatoid arthritis, hyperlipidemia, tobacco abuse, COPD, presents to the ER after being instructed by his PCP due to pancytopenia.  Patient reported excessive fatigue, dyspnea on exertion for the last few months.  Patient also reported right lower extremity swelling.  Patient presented to his PCP on 06/09/2018 with CBC showing a white count of 2.6, hemoglobin of 10 and platelet count of 12,000.  Patient was referred to the ER.  In the ER, repeat CBC showed a white count of 1.6, platelet count of 10,000, hemoglobin of 9.6. Patient reported for the last 3 months, he was instructed to increase his dose of methotrexate as well as folic acid.  Patient took increased dose of methotrexate but stopped taking his folic acid attributing that to nausea.  Hematology/oncology consulted.  Patient admitted for further management.   Today, patient reported feeling somewhat better.  Denies any worsening of his symptoms.  Denies any active bleeding, headaches, blurry vision, fever/chills, chest pain, abdominal pain, nausea/vomiting, diarrhea/constipation.  Assessment/Plan: Active Problems:   Protein-calorie malnutrition, severe   Non-small cell cancer of left lung (HCC)   Pancytopenia (HCC)   HLD (hyperlipidemia)   Rheumatoid arthritis (HCC)   Cellulitis of right leg  Pancytopenia Recently detected this month at PCP office, presenting with generalized fatigue Likely due to deficiency of folic acid and Z99  Vs methotrexate toxicity Vs leukemia Folate levels 4.5, vitamin B12 level 357 Heme-onc on board: Recommend folic acid and vitamin B12 supplementation.  Patient may require bone marrow biopsy Methotrexate level pending, continue to hold methotrexate Start folic acid 5 mg daily, vitamin B12  injections Daily CBC, monitor closely  Thrombocytopenia Status post platelet transfusion on 06/10/2018 DIC panel is negative Peripheral smear did not show any schistocytes to support TTP/HUS/DIC Continue to hold Eliquis Daily CBC  Macrocytic anemia Likely due to vitamin B12, folate deficiency Falsely low hemoglobin 6.6, likely due to lab error.  Repeat showed 9.3 without any RBC transfusion Continue vitamin B12 and folate supplementation Daily CBC  ?Cellulitis of right leg Afebrile, with leukopenia LA WNL BC X 2 pending Continue IV Rocephin for now  COPD Stable Continue home inhalers, Symbicort, Spiriva  History of DVT Hold Eliquis for now  Non-small cell cancer of left lung Diagnosed in 2018, status post radiation therapy concluded in Jan 2019 Outpt follow up  Rheumatoid arthritis Follows with outside rheumatologist Continue to hold home methotrexate, continue prednisone  Tobacco abuse Advised to quit Nicotine patch     Code Status: DNR  Family Communication: None at bedside  Disposition Plan: Once work-up is complete   Consultants:  Heme-onc  Procedures:  None  Antimicrobials:  IV ceftriaxone  DVT prophylaxis: SCDs   Objective: Vitals:   06/09/18 2223 06/09/18 2358 06/10/18 0528 06/10/18 0808  BP: 128/62 128/64 118/67   Pulse: 66 76 72   Resp: '16 16 15   ' Temp: 97.8 F (36.6 C) 99.3 F (37.4 C) 98.5 F (36.9 C)   TempSrc: Oral Axillary Oral   SpO2: 96% 94% 96% 92%  Weight:      Height:        Intake/Output Summary (Last 24 hours) at 06/10/2018 1218 Last data filed at 06/10/2018 0600 Gross per 24 hour  Intake 1051.54 ml  Output -  Net 1051.54 ml   [employer]   06/09/18  1648  Weight: 67.4 kg    Exam:   General: NAD  Cardiovascular: S1, S2 present  Respiratory: Diminished breath sounds bilaterally  Abdomen: Soft, nontender, nondistended, bowel sounds present  Musculoskeletal: Right lower extremity with some  erythema around the shin, no pedal edema noted  Skin: Mild RLE erythema around the shin  Psychiatry: Normal mood   Data Reviewed: CBC: Recent Labs  Lab 06/09/18 1618 06/10/18 0406 06/10/18 0555 06/10/18 0822  WBC 1.6* 2.0*  --   --   NEUTROABS 0.8*  --   --   --   HGB 9.6* 7.8* 6.6* 9.3*  HCT 30.3* 23.7* 19.0* 29.2*  MCV 107.8* 107.7*  --   --   PLT 10* 28*  --   --    Basic Metabolic Panel: Recent Labs  Lab 06/09/18 1617 06/09/18 1618 06/10/18 0406  NA  --  140 140  K  --  4.2 4.1  CL  --  104 106  CO2  --  27 26  GLUCOSE  --  120* 93  BUN  --  23 22  CREATININE  --  1.25* 1.11  CALCIUM  --  8.9 8.4*  MG 2.1  --  2.1  PHOS 2.5  --  3.7   GFR: Estimated Creatinine Clearance: 49.1 mL/min (by C-G formula based on SCr of 1.11 mg/dL). Liver Function Tests: Recent Labs  Lab 06/09/18 1617 06/10/18 0406  AST 26 20  ALT 19 18  ALKPHOS 79 65  BILITOT 0.6 0.5  PROT 7.3 6.0*  ALBUMIN 3.3* 2.7*   No results for input(s): LIPASE, AMYLASE in the last 168 hours. No results for input(s): AMMONIA in the last 168 hours. Coagulation Profile: Recent Labs  Lab 06/09/18 1618  INR 1.13   Cardiac Enzymes: No results for input(s): CKTOTAL, CKMB, CKMBINDEX, TROPONINI in the last 168 hours. BNP (last 3 results) No results for input(s): PROBNP in the last 8760 hours. HbA1C: No results for input(s): HGBA1C in the last 72 hours. CBG: No results for input(s): GLUCAP in the last 168 hours. Lipid Profile: No results for input(s): CHOL, HDL, LDLCALC, TRIG, CHOLHDL, LDLDIRECT in the last 72 hours. Thyroid Function Tests: Recent Labs    06/10/18 0406  TSH 0.522   Anemia Panel: Recent Labs    06/09/18 1617  VITAMINB12 357  FOLATE 4.5*  FERRITIN 709*  TIBC 237*  IRON 98  RETICCTPCT <0.4*   Urine analysis:    Component Value Date/Time   COLORURINE AMBER (A) 06/10/2018 0250   APPEARANCEUR CLEAR 06/10/2018 0250   LABSPEC 1.024 06/10/2018 0250   PHURINE 5.0  06/10/2018 0250   GLUCOSEU NEGATIVE 06/10/2018 0250   HGBUR SMALL (A) 06/10/2018 0250   BILIRUBINUR NEGATIVE 06/10/2018 0250   KETONESUR NEGATIVE 06/10/2018 0250   PROTEINUR NEGATIVE 06/10/2018 0250   NITRITE NEGATIVE 06/10/2018 0250   LEUKOCYTESUR NEGATIVE 06/10/2018 0250   Sepsis Labs: '@LABRCNTIP' (procalcitonin:4,lacticidven:4)  ) Recent Results (from the past 240 hour(s))  Culture, blood (Routine X 2) w Reflex to ID Panel     Status: None (Preliminary result)   Collection Time: 06/09/18  9:15 PM  Result Value Ref Range Status   Specimen Description   Final    BLOOD BLOOD LEFT ARM Performed at [facility], [place] [address], [place], [place] [address]    Special Requests   Final    BOTTLES DRAWN AEROBIC AND ANAEROBIC Blood Culture adequate volume Performed at [place] [address] [place],  [address]  Culture   Final    NO GROWTH < 12 HOURS Performed at [facility], [place] [address] [place], [place] [address]    Report Status PENDING  Incomplete  Culture, blood (Routine X 2) w Reflex to ID Panel     Status: None (Preliminary result)   Collection Time: 06/09/18  9:16 PM  Result Value Ref Range Status   Specimen Description   Final    BLOOD BLOOD LEFT ARM Performed at [place] [address], [place], [place] [address]    Special Requests   Final    BOTTLES DRAWN AEROBIC AND ANAEROBIC Blood Culture adequate volume Performed at [facility] [address] [place], [place] [address]    Culture   Final    NO GROWTH < 12 HOURS Performed at [facility] [address] [place], [place] [address]    Report Status PENDING  Incomplete  MRSA PCR Screening     Status: None   Collection Time: 06/10/18  2:50 AM  Result Value Ref Range Status   MRSA by PCR NEGATIVE NEGATIVE Final    Comment:        The GeneXpert MRSA Assay (FDA approved for NASAL specimens only), is  one component of a comprehensive MRSA colonization surveillance program. It is not intended to diagnose MRSA infection nor to guide or monitor treatment for MRSA infections. Performed at [facility], [place] [address], [place], [place] [address]       Studies: Dg Tibia/fibula Right  Result Date: 06/09/2018 CLINICAL DATA:  72 y/o  M; anterior leg pain. EXAM: RIGHT TIBIA AND FIBULA - 2 VIEW COMPARISON:  None. FINDINGS: There is no evidence of fracture or other focal bone lesions. Vascular calcifications noted. IMPRESSION: Negative. Electronically Signed   By: Kristine Garbe M.D.   On: 06/09/2018 21:41    Scheduled Meds: . sodium chloride   Intravenous Once  . folic acid  1 mg Oral Daily  . mometasone-formoterol  2 puff Inhalation BID  . predniSONE  2.5 mg Oral Daily  . rosuvastatin  10 mg Oral QPM  . senna  1 tablet Oral BID  . tiotropium  1 capsule Inhalation Daily  . valACYclovir  500 mg Oral BID    Continuous Infusions: . sodium chloride 50 mL/hr at 06/10/18 0904  . cefTRIAXone (ROCEPHIN)  IV Stopped (06/10/18 0039)     LOS: 1 day     Alma [place], MD [facility]   If 7PM-7AM, please contact night-coverage [email or web address] Password [facility] 06/10/2018, 12:18 PM

## 2018-06-10 NOTE — Progress Notes (Signed)
OT Cancellation Note  Patient Details Name: Kenneth [name] MRN: [id number] DOB: 05/03/1946   Cancelled Treatment:    Reason Eval/Treat Not Completed: Other (comment).  Pt to receive 1 unit of blood today. Will check back later today or tomorrow.  [name] 06/10/2018, 7:45 AM  Lesle Chris, OTR/L [phone] 06/10/2018

## 2018-06-10 NOTE — Progress Notes (Signed)
PT Cancellation Note  Patient Details Name: SAMIK BALKCOM MRN: [id number] DOB: [birth year]   Cancelled Treatment:    Reason Eval/Treat Not Completed: Medical issues which prohibited therapy,   Claretha Cooper 06/10/2018, 10:53 AM Tresa Endo PT [phone]

## 2018-06-11 DIAGNOSIS — M549 Dorsalgia, unspecified: Secondary | ICD-10-CM

## 2018-06-11 DIAGNOSIS — E440 Moderate protein-calorie malnutrition: Secondary | ICD-10-CM

## 2018-06-11 LAB — CBC WITH DIFFERENTIAL/PLATELET
Basophils Absolute: 0 10*3/uL (ref 0.0–0.1)
Basophils Relative: 0 %
EOS ABS: 0.5 10*3/uL (ref 0.0–0.7)
Eosinophils Relative: 22 %
HEMATOCRIT: 26 % — AB (ref 39.0–52.0)
HEMOGLOBIN: 8.4 g/dL — AB (ref 13.0–17.0)
LYMPHS PCT: 32 %
Lymphs Abs: 0.7 10*3/uL (ref 0.7–4.0)
MCH: 34.7 pg — AB (ref 26.0–34.0)
MCHC: 32.3 g/dL (ref 30.0–36.0)
MCV: 107.4 fL — ABNORMAL HIGH (ref 78.0–100.0)
MONOS PCT: 2 %
Monocytes Absolute: 0 10*3/uL — ABNORMAL LOW (ref 0.1–1.0)
NEUTROS PCT: 44 %
Neutro Abs: 1.1 10*3/uL — ABNORMAL LOW (ref 1.7–7.7)
Platelets: 19 10*3/uL — CL (ref 150–400)
RBC: 2.42 MIL/uL — ABNORMAL LOW (ref 4.22–5.81)
RDW: 13.3 % (ref 11.5–15.5)
WBC: 2.3 10*3/uL — ABNORMAL LOW (ref 4.0–10.5)

## 2018-06-11 LAB — BASIC METABOLIC PANEL
Anion gap: 6 (ref 5–15)
BUN: 17 mg/dL (ref 8–23)
CHLORIDE: 108 mmol/L (ref 98–111)
CO2: 24 mmol/L (ref 22–32)
Calcium: 8.6 mg/dL — ABNORMAL LOW (ref 8.9–10.3)
Creatinine, Ser: 0.96 mg/dL (ref 0.61–1.24)
GFR calc non Af Amer: >60 mL/min (ref >60)
Glucose, Bld: 108 mg/dL — ABNORMAL HIGH (ref 70–99)
POTASSIUM: 4.6 mmol/L (ref 3.5–5.1)
SODIUM: 138 mmol/L (ref 135–145)

## 2018-06-11 LAB — BPAM PLATELET PHERESIS
BLOOD PRODUCT EXPIRATION DATE: 201908232359
ISSUE DATE / TIME: 201908232222
UNIT TYPE AND RH: 6200

## 2018-06-11 LAB — HAPTOGLOBIN: Haptoglobin: 299 mg/dL — ABNORMAL HIGH (ref 34–200)

## 2018-06-11 LAB — PREPARE PLATELET PHERESIS: Unit division: 0

## 2018-06-11 MED ORDER — ENSURE ENLIVE PO LIQD
237.0000 mL | Freq: Two times a day (BID) | ORAL | Status: DC
  Administered 2018-06-11 – 2018-06-15 (×6): 237 mL via ORAL

## 2018-06-11 MED ORDER — SODIUM CHLORIDE 0.9 % IV SOLN
INTRAVENOUS | Status: DC | PRN
  Administered 2018-06-11 – 2018-06-12 (×2): 1000 mL via INTRAVENOUS

## 2018-06-11 NOTE — Progress Notes (Signed)
PROGRESS NOTE  Kenneth [name] CMK:[id number] DOB: [birth year] DOA: 06/09/2018 PCP: [name], MD  HPI/Recap of past 17 hours: 72 year old male with medical history significant for long CAD, history of DVT, rheumatoid arthritis, hyperlipidemia, tobacco abuse, COPD, presents to the ER after being instructed by his PCP due to pancytopenia.  Patient reported excessive fatigue, dyspnea on exertion for the last few months.  Patient also reported right lower extremity swelling.  Patient presented to his PCP on 06/09/2018 with CBC showing a white count of 2.6, hemoglobin of 10 and platelet count of 12,000.  Patient was referred to the ER.  In the ER, repeat CBC showed a white count of 1.6, platelet count of 10,000, hemoglobin of 9.6. Patient reported for the last 3 months, he was instructed to increase his dose of methotrexate as well as folic acid.  Patient took increased dose of methotrexate but stopped taking his folic acid attributing that to nausea.  Hematology/oncology consulted.  Patient admitted for further management.   Today, patient reported feeling better. Denies any active bleeding.  Assessment/Plan: Active Problems:   Protein-calorie malnutrition, severe   Non-small cell cancer of left lung (HCC)   Pancytopenia (HCC)   HLD (hyperlipidemia)   Rheumatoid arthritis (HCC)   Cellulitis of right leg   Malnutrition of moderate degree  Pancytopenia Recently detected this month at PCP office, presenting with generalized fatigue Likely due to deficiency of folic acid and V69  Vs methotrexate toxicity Vs leukemia Folate levels 4.5, vitamin B12 level 357 Heme-onc on board: Recommend folic acid and vitamin B12 supplementation.  Patient may require bone marrow biopsy on 06/12/18 by IR Methotrexate level pending, continue to hold methotrexate Continue folic acid 5 mg daily, vitamin B12 Daily CBC, monitor closely  Thrombocytopenia Status post platelet transfusion on 06/10/2018 DIC panel is  negative Peripheral smear did not show any schistocytes to support TTP/HUS/DIC Continue to hold Eliquis Daily CBC  Macrocytic anemia Likely due to vitamin B12, folate deficiency Falsely low hemoglobin 6.6, likely due to lab error.  Repeat showed 9.3 without any RBC transfusion Continue vitamin B12 and folate supplementation Daily CBC  ?Cellulitis of right leg Afebrile, with leukopenia LA WNL BC X 2 pending Continue IV Rocephin for now  COPD Stable Continue home inhalers, Symbicort, Spiriva  History of DVT Hold Eliquis for now  Non-small cell cancer of left lung Diagnosed in 2018, status post radiation therapy concluded in Jan 2019 Outpt follow up  Rheumatoid arthritis Follows with outside rheumatologist Continue to hold home methotrexate, continue prednisone  Tobacco abuse Advised to quit Nicotine patch     Code Status: DNR  Family Communication: None at bedside  Disposition Plan: Once work-up is complete   Consultants:  Heme-onc  Procedures:  None  Antimicrobials:  IV ceftriaxone  DVT prophylaxis: SCDs   Objective: Vitals:   06/10/18 2146 06/11/18 0507 06/11/18 0750 06/11/18 1431  BP: 128/69 (!) 144/75  136/76  Pulse: 83 81  78  Resp: _0 Temp: 99 F (37.2 C) 98.6 F (37 C)  98.1 F (36.7 C)  TempSrc: Oral Oral  Oral  SpO2: 96% 90% 91% 91%  Weight:      Height:        Intake/Output Summary (Last 24 hours) at 06/11/2018 1536 Last data filed at 06/11/2018 1116 Gross per 24 hour  Intake 1374.19 ml  Output -  Net 1374.19 ml   [employer]   06/09/18 1648  Weight: 67.4 kg    Exam:  General: NAD  Cardiovascular: S1, S2 present  Respiratory: Diminished breath sounds bilaterally  Abdomen: Soft, nontender, nondistended, bowel sounds present  Musculoskeletal: Right lower extremity with some erythema around the shin, no pedal edema noted  Skin: Mild RLE erythema around the shin  Psychiatry: Normal mood   Data  Reviewed: CBC: Recent Labs  Lab 06/09/18 1618 06/10/18 0406 06/10/18 0555 06/10/18 0822 06/11/18 0554  WBC 1.6* 2.0*  --   --  2.3*  NEUTROABS 0.8*  --   --   --  1.1*  HGB 9.6* 7.8* 6.6* 9.3* 8.4*  HCT 30.3* 23.7* 19.0* 29.2* 26.0*  MCV 107.8* 107.7*  --   --  107.4*  PLT 10* 28*  --   --  19*   Basic Metabolic Panel: Recent Labs  Lab 06/09/18 1617 06/09/18 1618 06/10/18 0406 06/11/18 0554  NA  --  140 140 138  K  --  4.2 4.1 4.6  CL  --  104 106 108  CO2  --  _0 GLUCOSE  --  120* 93 108*  BUN  --  _1 CREATININE  --  1.25* 1.11 0.96  CALCIUM  --  8.9 8.4* 8.6*  MG 2.1  --  2.1  --   PHOS 2.5  --  3.7  --    GFR: Estimated Creatinine Clearance: 56.8 mL/min (by C-G formula based on SCr of 0.96 mg/dL). Liver Function Tests: Recent Labs  Lab 06/09/18 1617 06/10/18 0406  AST 26 20  ALT 19 18  ALKPHOS 79 65  BILITOT 0.6 0.5  PROT 7.3 6.0*  ALBUMIN 3.3* 2.7*   No results for input(s): LIPASE, AMYLASE in the last 168 hours. No results for input(s): AMMONIA in the last 168 hours. Coagulation Profile: Recent Labs  Lab 06/09/18 1618  INR 1.13   Cardiac Enzymes: No results for input(s): CKTOTAL, CKMB, CKMBINDEX, TROPONINI in the last 168 hours. BNP (last 3 results) No results for input(s): PROBNP in the last 8760 hours. HbA1C: No results for input(s): HGBA1C in the last 72 hours. CBG: No results for input(s): GLUCAP in the last 168 hours. Lipid Profile: No results for input(s): CHOL, HDL, LDLCALC, TRIG, CHOLHDL, LDLDIRECT in the last 72 hours. Thyroid Function Tests: Recent Labs    06/10/18 0406  TSH 0.522   Anemia Panel: Recent Labs    06/09/18 1617  VITAMINB12 357  FOLATE 4.5*  FERRITIN 709*  TIBC 237*  IRON 98  RETICCTPCT <0.4*   Urine analysis:    Component Value Date/Time   COLORURINE AMBER (A) 06/10/2018 0250   APPEARANCEUR CLEAR 06/10/2018 0250   LABSPEC 1.024 06/10/2018 0250   PHURINE 5.0 06/10/2018 0250   GLUCOSEU  NEGATIVE 06/10/2018 0250   HGBUR SMALL (A) 06/10/2018 0250   BILIRUBINUR NEGATIVE 06/10/2018 0250   KETONESUR NEGATIVE 06/10/2018 0250   PROTEINUR NEGATIVE 06/10/2018 0250   NITRITE NEGATIVE 06/10/2018 0250   LEUKOCYTESUR NEGATIVE 06/10/2018 0250   Sepsis Labs: _2 (procalcitonin:4,lacticidven:4)  ) Recent Results (from the past 240 hour(s))  Culture, blood (Routine X 2) w Reflex to ID Panel     Status: None (Preliminary result)   Collection Time: 06/09/18  9:15 PM  Result Value Ref Range Status   Specimen Description   Final    BLOOD BLOOD LEFT ARM Performed at [facility], [place] [address] [place], [place] [address]    Special Requests   Final    BOTTLES DRAWN AEROBIC AND ANAEROBIC Blood Culture adequate volume  Performed at [facility], [place] [address], [place], [place] [address]    Culture   Final    NO GROWTH < 12 HOURS Performed at [facility] [address], [place], [place] [address]    Report Status PENDING  Incomplete  Culture, blood (Routine X 2) w Reflex to ID Panel     Status: None (Preliminary result)   Collection Time: 06/09/18  9:16 PM  Result Value Ref Range Status   Specimen Description   Final    BLOOD BLOOD LEFT ARM Performed at [facility] [address] [place], [place] [address]    Special Requests   Final    BOTTLES DRAWN AEROBIC AND ANAEROBIC Blood Culture adequate volume Performed at [facility] [address] [place], [place] [address]    Culture   Final    NO GROWTH < 12 HOURS Performed at [facility] [address] [place], [place] [address]    Report Status PENDING  Incomplete  MRSA PCR Screening     Status: None   Collection Time: 06/10/18  2:50 AM  Result Value Ref Range Status   MRSA by PCR NEGATIVE NEGATIVE Final    Comment:        The GeneXpert MRSA Assay (FDA approved for NASAL specimens only), is one component of  a comprehensive MRSA colonization surveillance program. It is not intended to diagnose MRSA infection nor to guide or monitor treatment for MRSA infections. Performed at [facility], [place] [address] [place], [place] [address]       Studies: No results found.  Scheduled Meds: . sodium chloride   Intravenous Once  . feeding supplement (ENSURE ENLIVE)  237 mL Oral BID BM  . folic acid  5 mg Oral Daily  . mometasone-formoterol  2 puff Inhalation BID  . predniSONE  2.5 mg Oral Daily  . rosuvastatin  10 mg Oral QPM  . senna  1 tablet Oral BID  . tiotropium  1 capsule Inhalation Daily  . valACYclovir  500 mg Oral BID  . vitamin B-12  1,000 mcg Oral Daily    Continuous Infusions: . cefTRIAXone (ROCEPHIN)  IV Stopped (06/10/18 2147)     LOS: 2 days     Alma [place], MD [facility]   If 7PM-7AM, please contact night-coverage [email or web address] Password [facility] 06/11/2018, 3:36 PM

## 2018-06-11 NOTE — Evaluation (Signed)
Physical Therapy Evaluation Patient Details Name: Kenneth [name] MRN: [id number] DOB: 17-May-1946 Today's Date: 06/11/2018   History of Present Illness  Pt was admitted with pantocyopenia from MD office.  He has had increased fatique and DOE.  PMH:  NSCLC, CAD, RA, and COPD.    Clinical Impression  Pt admitted with above diagnosis. Pt currently with functional limitations due to the deficits listed below (see PT Problem List). On eval, pt required min guard assist transfers and min guard assist ambulation 100 feet without AD. Pt will benefit from skilled PT to increase their independence and safety with mobility to allow discharge to the venue listed below.  Pt's brother present during session. He reports he will be staying with pt at d/c in the evenings and on weekends and will provide any mobility assist needed.     Follow Up Recommendations No PT follow up;Supervision for mobility/OOB    Equipment Recommendations  None recommended by PT    Recommendations for Other Services       Precautions / Restrictions Precautions Precautions: Fall      Mobility  Bed Mobility Overal bed mobility: Modified Independent             General bed mobility comments: HOB raised, +rail, increased time and effort  Transfers Overall transfer level: Needs assistance Equipment used: None Transfers: Sit to/from Stand Sit to Stand: Min guard         General transfer comment: increased time and effort  Ambulation/Gait Ambulation/Gait assistance: Min guard Gait Distance (Feet): 100 Feet Assistive device: None Gait Pattern/deviations: Step-through pattern;Decreased stride length Gait velocity: decreased Gait velocity interpretation: 1.31 - 2.62 ft/sec, indicative of limited community ambulator General Gait Details: short, shuffle steps  Stairs            Wheelchair Mobility    Modified Rankin (Stroke Patients Only)       Balance Overall balance assessment: Mild deficits  observed, not formally tested                                           Pertinent Vitals/Pain Pain Assessment: Faces Faces Pain Scale: Hurts little more Pain Location: low back Pain Descriptors / Indicators: Sore Pain Intervention(s): Limited activity within patient's tolerance;Monitored during session;Premedicated before session    Home Living Family/patient expects to be discharged to:: Private residence Living Arrangements: Spouse/significant other Available Help at Discharge: Family;Available PRN/intermittently Type of Home: House Home Access: Stairs to enter   Entrance Stairs-Number of Steps: 1 Home Layout: Able to live on main level with bedroom/bathroom Home Equipment: Cane - single point Additional Comments: pt's wife has Leukemia. She has a shower with seat but is the only one to use it.  Pt sponge bathes at sink in his great room (converted garage) and sleeps on the couch there. His bed/bath (tub) is upstairs    Prior Function Level of Independence: Independent         Comments: mod I, moves slowly, needs increased time     Hand Dominance        Extremity/Trunk Assessment   Upper Extremity Assessment Upper Extremity Assessment: Overall WFL for tasks assessed    Lower Extremity Assessment Lower Extremity Assessment: Overall WFL for tasks assessed    Cervical / Trunk Assessment Cervical / Trunk Assessment: Normal  Communication   Communication: No difficulties  Cognition Arousal/Alertness: Awake/alert Behavior During Therapy: [name]  for tasks assessed/performed Overall Cognitive Status: Within Functional Limits for tasks assessed                                        General Comments      Exercises     Assessment/Plan    PT Assessment Patient needs continued PT services  PT Problem List Decreased mobility;Decreased activity tolerance;Decreased balance;Pain       PT Treatment Interventions Therapeutic  activities;Gait training;Therapeutic exercise;Patient/family education;Balance training;Stair training;Functional mobility training    PT Goals (Current goals can be found in the Care Plan section)  Acute Rehab PT Goals Patient Stated Goal: home PT Goal Formulation: With patient/family Time For Goal Achievement: 06/25/18 Potential to Achieve Goals: Fair    Frequency Min 3X/week   Barriers to discharge        Co-evaluation               AM-PAC PT "6 Clicks" Daily Activity  Outcome Measure Difficulty turning over in bed (including adjusting bedclothes, sheets and blankets)?: A Little Difficulty moving from lying on back to sitting on the side of the bed? : A Lot Difficulty sitting down on and standing up from a chair with arms (e.g., wheelchair, bedside commode, etc,.)?: A Little Help needed moving to and from a bed to chair (including a wheelchair)?: A Little Help needed walking in hospital room?: A Little Help needed climbing 3-5 steps with a railing? : A Little 6 Click Score: 17    End of Session Equipment Utilized During Treatment: Gait belt Activity Tolerance: Patient limited by pain Patient left: in bed;with call bell/phone within reach;with bed alarm set;with family/visitor present Nurse Communication: Mobility status PT Visit Diagnosis: Difficulty in walking, not elsewhere classified (R26.2)    Time: 7628-3151 PT Time Calculation (min) (ACUTE ONLY): 25 min   Charges:   PT Evaluation $PT Eval Low Complexity: 1 Low PT Treatments $Gait Training: 8-22 mins        Lorrin Goodell, PT  Office # [phone] Pager [phone]   Lorriane Shire 06/11/2018, 1:41 PM

## 2018-06-11 NOTE — Progress Notes (Signed)
IP PROGRESS NOTE  Subjective:   Kenneth [name] reports increased back pain overnight.  He sat in the chair for 45 minutes which have attributed to his back pain.  He had denies any fevers or chills.  He denies any recent bleeding.  He denies any ecchymosis or hemoptysis.  His appetite has been reasonable.  Objective:  Vital signs in last 24 hours: Temp:  [98.5 F (36.9 C)-99 F (37.2 C)] 98.6 F (37 C) (08/25 0507) Pulse Rate:  [81-90] 81 (08/25 0507) Resp:  [16-17] 16 (08/25 0507) BP: (128-144)/(64-75) 144/75 (08/25 0507) SpO2:  [90 %-96 %] 91 % (08/25 0750) Weight change:     Intake/Output from previous day: 08/24 0701 - 08/25 0700 In: 1302.5 [P.O.:240; I.V.:962.5; IV Piggyback:100] Out: -  General: Comfortable without distress. Head: Normocephalic without abnormalities. Mouth: No bleeding noted. Eyes:  Pupils are equal and round reactive to light. Resp: clear in all lung fields without any wheezes. Cardio: regular rate and rhythm, without any murmurs or gallops. GI: soft, without any rebound or guarding. Musculoskeletal: No clubbing or cyanosis. Neurological: No motor or sensory deficits. Skin: Ecchymosis noted around his phlebotomy sites.    Lab Results: Recent Labs    06/10/18 0406  06/10/18 0822 06/11/18 0554  WBC 2.0*  --   --  2.3*  HGB 7.8*   < > 9.3* 8.4*  HCT 23.7*   < > 29.2* 26.0*  PLT 28*  --   --  19*   < > = values in this interval not displayed.    BMET Recent Labs    06/10/18 0406 06/11/18 0554  NA 140 138  K 4.1 4.6  CL 106 108  CO2 26 24  GLUCOSE 93 108*  BUN 22 17  CREATININE 1.11 0.96  CALCIUM 8.4* 8.6*       Medications: I have reviewed the patient's current medications.  Assessment/Plan:  1.  Pancytopenia that has developed in the last 3 months after normal CBC in May 2019.  This acute finding is very concerning for either vitamin deficiency versus acute bone marrow process.  His folic acid has been low and replacement has  started.  We are still dealing with high possibility of acute leukemic process given the presentation.  The risks and benefits of proceeding of a bone marrow biopsy was reviewed again.  Complications include bleeding, infection and pain were reviewed.  He is very familiar with this process as his wife was diagnosed with leukemia last year.  After discussion he is agreeable to proceed and I will ask interventional radiology to kindly evaluate him for possible bone marrow biopsy on 06/12/2018.  We have discussed about the possibility of this being acute leukemic process and the ramification of this diagnosis.  He understands what the diagnosis means and the implication on his life expectancy as well as treatment process.  He is considering these options in case we are dealing with acute leukemia.  His methotrexate level is still pending.   2.  Anemia: With macrocytosis.  This could be related to his folic acid deficiency as well as possibility of a acute bone marrow process.  His hemoglobin is 8.4 and does not require any transfusion at this time.   3.  Thrombocytopenia: He received platelet transfusion on 06/09/2018 and currently blood count and 19,000 without any active bleeding.  No transfusion is needed unless he is actively bleeding or platelet count of 10,000 or less.  4.  CODE STATUS: He is DNR which has  been documented and confirmed with him.  I certainly agree with his decision.  We will continue to follow with you and update recommendation as more information is available to him.  25  minutes was  face-to-face with the patient today.  More than 50% of time was dedicated to discussing the differential diagnosis of his laboratory findings, the ramification of these diagnoses, treatment options as well as diagnostic procedures that is needed to address his condition.     LOS: 2 days   Kenneth [name] 06/11/2018, 8:34 AM

## 2018-06-11 NOTE — Progress Notes (Signed)
Initial Nutrition Assessment  DOCUMENTATION CODES:   Non-severe (moderate) malnutrition in context of chronic illness  INTERVENTION:   Provide Ensure Enlive po BID, each supplement provides 350 kcal and 20 grams of protein  NUTRITION DIAGNOSIS:   Moderate Malnutrition related to chronic illness, cancer and cancer related treatments as evidenced by moderate fat depletion, moderate muscle depletion.  GOAL:   Patient will meet greater than or equal to 90% of their needs  MONITOR:   PO intake, Supplement acceptance, Weight trends, Labs, I & O's, Skin  REASON FOR ASSESSMENT:   Consult Assessment of nutrition requirement/status  ASSESSMENT:   72 y.o. male with medical history significant of Lung CA, hx of DVT, depression HLD, RA, tobacco abuse, anemia due to folic acid deficiency, B12 deficiency COPD   Patient reports poor appetite and taste changes PTA. Pt now eating 100% of meals, however. Per oncology note, pt with possible acute leukemia and anemia from B-12 and folate deficiency. Will order Ensure supplements for additional kcal and protein.  Per weight records, weight is stable.   Labs reviewed. Medications: Folic acid tablet daily, Senokot tablet BID, Vitamin B-12 tablet daily  NUTRITION - FOCUSED PHYSICAL EXAM:    Most Recent Value  Orbital Region  No depletion  Upper Arm Region  Moderate depletion  Thoracic and Lumbar Region  Unable to assess  Buccal Region  Mild depletion  Temple Region  Mild depletion  Clavicle Bone Region  Moderate depletion  Clavicle and Acromion Bone Region  Moderate depletion  Scapular Bone Region  Unable to assess  Dorsal Hand  No depletion  Patellar Region  Mild depletion  Anterior Thigh Region  No depletion  Posterior Calf Region  Mild depletion  Edema (RD Assessment)  None       Diet Order:   Diet Order            Diet regular Room service appropriate? Yes; Fluid consistency: Thin  Diet effective now               EDUCATION NEEDS:   No education needs have been identified at this time  Skin:  Skin Assessment: Reviewed RN Assessment  Last BM:  PTA  Height:   Ht Readings from Last 1 Encounters:  06/09/18 5\' 3"  (1.6 m)    Weight:   Wt Readings from Last 1 Encounters:  06/09/18 67.4 kg    Ideal Body Weight:  52.3 kg  BMI:  Body mass index is 26.32 kg/m.  Estimated Nutritional Needs:   Kcal:  1700-1900  Protein:  80-90g  Fluid:  1.9L/day   Clayton Bibles, MS, RD, LDN [place] [facility] Pager: [phone] After Hours Pager: [phone]

## 2018-06-12 LAB — PROTEIN ELECTROPHORESIS, SERUM
A/G RATIO SPE: 0.9 (ref 0.7–1.7)
ALBUMIN ELP: 2.8 g/dL — AB (ref 2.9–4.4)
Alpha-1-Globulin: 0.3 g/dL (ref 0.0–0.4)
Alpha-2-Globulin: 0.9 g/dL (ref 0.4–1.0)
BETA GLOBULIN: 1 g/dL (ref 0.7–1.3)
GLOBULIN, TOTAL: 3.2 g/dL (ref 2.2–3.9)
Gamma Globulin: 1 g/dL (ref 0.4–1.8)
M-Spike, %: 0.2 g/dL — ABNORMAL HIGH
Total Protein ELP: 6 g/dL (ref 6.0–8.5)

## 2018-06-12 LAB — CBC WITH DIFFERENTIAL/PLATELET
Basophils Absolute: 0 10*3/uL (ref 0.0–0.1)
Basophils Relative: 0 %
Eosinophils Absolute: 0.5 10*3/uL (ref 0.0–0.7)
Eosinophils Relative: 21 %
HCT: 24.1 % — ABNORMAL LOW (ref 39.0–52.0)
HEMOGLOBIN: 7.6 g/dL — AB (ref 13.0–17.0)
LYMPHS ABS: 0.8 10*3/uL (ref 0.7–4.0)
Lymphocytes Relative: 31 %
MCH: 33.5 pg (ref 26.0–34.0)
MCHC: 31.5 g/dL (ref 30.0–36.0)
MCV: 106.2 fL — AB (ref 78.0–100.0)
MONO ABS: 0.1 10*3/uL (ref 0.1–1.0)
MONOS PCT: 3 %
NEUTROS PCT: 45 %
Neutro Abs: 1.1 10*3/uL — ABNORMAL LOW (ref 1.7–7.7)
Platelets: 21 10*3/uL — CL (ref 150–400)
RBC: 2.27 MIL/uL — ABNORMAL LOW (ref 4.22–5.81)
RDW: 13.5 % (ref 11.5–15.5)
WBC: 2.5 10*3/uL — ABNORMAL LOW (ref 4.0–10.5)

## 2018-06-12 LAB — PREPARE RBC (CROSSMATCH)

## 2018-06-12 MED ORDER — SODIUM CHLORIDE 0.9% IV SOLUTION
Freq: Once | INTRAVENOUS | Status: AC
  Administered 2018-06-12: 18:00:00 via INTRAVENOUS

## 2018-06-12 NOTE — Consult Note (Signed)
Chief Complaint: Patient was seen in consultation today for bone marrow biopsy  Referring Physician(s): Dr. Zola Button  Supervising Physician: Sandi Mariscal  Patient Status: [name] - [facility]  History of Present Illness: Kenneth [name] is a 72 y.o. male with a past medical history of COPD, lung CA s/p radiation treatment (last January 2019), history of DVT, rheumatoid arthritis, anemia and B12 deficiency. Patient presented to [facility] ED on 8/23 from PCP's office due to abnormal blood work - H/H 10.1/34, plt 12, WBC 2.4. He did endorse one episode of epistaxis about 3 weeks prior to that day, but denies any other appreciable episodes of bleeding. He is currently on methrotrexate for RA with recent dose increase in the past 3 months. He reports worsening fatigue so extreme that he required a wheelchair recently, dyspnea on exertion and poor appetite. He was admitted for further evaluation and monitoring - he received a platelet infusion on 8/24 and is to receive 1 units PRBC today. Request to IR for bone marrow biopsy in setting of pancytopenia.  Patient reports poor PO intake because nothing tastes good anymore, he also states that he is very tired and just does not feel like eating. He reports he initially presented to PCP's office for leg swelling that he was concerned was another DVT however [place] were negative. He denies any abnormal bleeding, melena, hematochezia, hemoptysis or increased bruising.   Past Medical History:  Diagnosis Date  . Abnormal LFTs   . Anxiety   . COPD (chronic obstructive pulmonary disease) (HCC)    emphysema.  . CTS (carpal tunnel syndrome)    Left  . DDD (degenerative disc disease)   . Depression    mild  . Elevated MCV   . Emphysema of lung ([place])   . Gout   . Gynecomastia   . Hearing loss   . History of radiation therapy 10/17/17-10/24/17   left lung treated to 54 Gy in 3 fractions  . Hyperlipidemia   . Microhematuria   . Nephrolithiasis   . Ocular migraine   .  Panic attack   . Rheumatoid arthritis(714.0) dx 2000  . Situational hypertension   . Tobacco abuse     Past Surgical History:  Procedure Laterality Date  . APPENDECTOMY    . COLONOSCOPY    . CYSTOSTOMY W/ BLADDER BIOPSY  2006  . ESOPHAGOGASTRODUODENOSCOPY N/A 04/30/2016   Procedure: ESOPHAGOGASTRODUODENOSCOPY (EGD);  Surgeon: Doran Stabler, MD;  Location: [facility];  Service: Endoscopy;  Laterality: N/A;    Allergies: Other; Aspirin; and Naproxen sodium  Medications: Prior to Admission medications   Medication Sig Start Date End Date Taking? Authorizing Provider  acetaminophen (TYLENOL) 325 MG tablet Take 325 mg by mouth every 4 (four) hours as needed for mild pain.    Yes [provider]  amLODipine (NORVASC) 5 MG tablet Take 1 tablet daily by mouth. 08/06/17  Yes [provider]  apixaban (ELIQUIS) 2.5 MG TABS tablet Take 2.5 mg by mouth daily.    Yes [provider]  budesonide-formoterol (SYMBICORT) 80-4.5 MCG/ACT inhaler Inhale 2 puffs into the lungs 2 (two) times daily.   Yes [provider]  cholecalciferol (VITAMIN D) 1000 UNITS tablet Take 1,000 Units by mouth daily.     Yes [provider]  clonazePAM (KLONOPIN) 0.5 MG tablet Take 0.5 mg by mouth 2 (two) times daily as needed for anxiety.    Yes [provider]  Dextromethorphan-Guaifenesin (TUSSIN DM PO) Take 1 tablet by mouth every  6 (six) hours as needed (cough and cold).    Yes [provider]  fluticasone (FLONASE) 50 MCG/ACT nasal spray Place 2 sprays into the nose daily.     Yes [provider]  folic acid (FOLVITE) 1 MG tablet Take 1 mg by mouth daily.     Yes [provider]  HYDROcodone-acetaminophen (NORCO/VICODIN) 5-325 MG tablet Take 1 tablet every 6 (six) hours as needed by mouth for moderate pain.   Yes [provider]  methotrexate (RHEUMATREX) 2.5 MG tablet Take 12.5 mg by mouth 2 (two) times a week. Take 5 tabs  every Friday 5 tabs on Sunday   Yes [provider]  nystatin (MYCOSTATIN) 100000 UNIT/ML suspension Take 5 mLs (500,000 Units total) by mouth 4 (four) times daily. 05/01/16  Yes Ikramullah, Mir Mohammed, MD  predniSONE (DELTASONE) 1 MG tablet Take 2.5 mg by mouth daily. 05/18/18  Yes [provider]  rosuvastatin (CRESTOR) 20 MG tablet Take 10 mg every evening by mouth.    Yes [provider]  Tiotropium Bromide Monohydrate (SPIRIVA RESPIMAT) 1.25 MCG/ACT AERS Inhale 2 puffs into the lungs daily.   Yes [provider]  valACYclovir (VALTREX) 500 MG tablet Take 500 mg 2 (two) times daily by mouth.   Yes [provider]  zoledronic acid (RECLAST) 5 MG/100ML SOLN Inject 5 mg into the vein once. Every 2 years    Yes [provider]  levofloxacin (LEVAQUIN) 500 MG tablet TK 1 T PO QD FOR 7 DAYS 11/18/17   [provider]  pantoprazole (PROTONIX) 40 MG tablet Take 1 tablet (40 mg total) by mouth daily. Patient not taking: Reported on 06/09/2018 05/01/16   Tomma Rakers, MD     Family History  Problem Relation Age of Onset  . Lung cancer Father   . Asthma Paternal Grandmother   . Lung cancer Paternal Aunt   . Breast cancer Maternal Aunt   . Breast cancer Maternal Aunt   . Stomach cancer Maternal Uncle   . Colon cancer Neg Hx     Social History   Socioeconomic History  . Marital status: Married    Spouse name: Mary  . Number of children: 1  . Years of education: Not on file  . Highest education level: Not on file  Occupational History  . Occupation: Civil engineer, contracting: UNEMPLOYED  Social Needs  . Financial resource strain: Not on file  . Food insecurity:    Worry: Not on file    Inability: Not on file  . Transportation needs:    Medical: Not on file    Non-medical: Not on file  Tobacco Use  . Smoking status: Current Every Day Smoker    Packs/day: 0.50    Years: 44.00    Pack years: 22.00    Types:  Cigarettes  . Smokeless tobacco: Never Used  Substance and Sexual Activity  . Alcohol use: No  . Drug use: No  . Sexual activity: Not on file  Lifestyle  . Physical activity:    Days per week: Not on file    Minutes per session: Not on file  . Stress: Not on file  Relationships  . Social connections:    Talks on phone: Not on file    Gets together: Not on file    Attends religious service: Not on file    Active member of club or organization: Not on file    Attends meetings of clubs or organizations: Not  on file    Relationship status: Not on file  Other Topics Concern  . Not on file  Social History Narrative   Married since 1974   2 years at [place]: A 12 point ROS discussed and pertinent positives are indicated in the HPI above.  All other systems are negative.  Review of Systems  Constitutional: Positive for activity change, appetite change and fatigue. Negative for chills and fever.  HENT: Negative for nosebleeds.   Respiratory: Positive for shortness of breath (with exertion). Negative for cough.   Cardiovascular: Positive for leg swelling. Negative for chest pain.  Gastrointestinal: Negative for abdominal pain, blood in stool, diarrhea, nausea and vomiting.  Genitourinary: Negative for hematuria.  Skin: Negative for rash.  Neurological: Negative for syncope.  Hematological: Does not bruise/bleed easily.  Psychiatric/Behavioral: Negative for confusion.    Vital Signs: BP 134/63 (BP Location: Left Arm)   Pulse 79   Temp 99.4 F (37.4 C) (Oral)   Resp 16   Ht _0  (1.6 m)   Wt 148 lb 9.4 oz (67.4 kg)   SpO2 93%   BMI 26.32 kg/m   Physical Exam  Constitutional: He is oriented to person, place, and time. No distress.  HENT:  Head: Normocephalic.  Cardiovascular: Normal rate, regular rhythm and normal heart sounds.  Pulmonary/Chest: Effort normal and breath sounds normal.  Viola in place; no O2 on  Abdominal: Soft. Bowel sounds are normal.  He exhibits no distension. There is no tenderness.  Neurological: He is alert and oriented to person, place, and time.  Skin: Skin is warm and dry. He is not diaphoretic.  Ecchymoses bilateral upper extremitites  Psychiatric: He has a normal mood and affect. His behavior is normal. Judgment and thought content normal.  Nursing note and vitals reviewed.    MD Evaluation Airway: WNL Heart: WNL Abdomen: WNL Chest/ Lungs: WNL ASA  Classification: 3 Mallampati/Airway Score: One   Imaging: Dg Tibia/fibula Right  Result Date: 06/09/2018 CLINICAL DATA:  72 y/o  M; anterior leg pain. EXAM: RIGHT TIBIA AND FIBULA - 2 VIEW COMPARISON:  None. FINDINGS: There is no evidence of fracture or other focal bone lesions. Vascular calcifications noted. IMPRESSION: Negative. Electronically Signed   By: Kristine Garbe M.D.   On: 06/09/2018 21:41    Labs:  CBC: Recent Labs    06/09/18 1618 06/10/18 0406 06/10/18 0555 06/10/18 0822 06/11/18 0554 06/12/18 0501  WBC 1.6* 2.0*  --   --  2.3* 2.5*  HGB 9.6* 7.8* 6.6* 9.3* 8.4* 7.6*  HCT 30.3* 23.7* 19.0* 29.2* 26.0* 24.1*  PLT 10* 28*  --   --  19* 21*    COAGS: Recent Labs    09/01/17 0942 06/09/18 1617 06/09/18 1618  INR 1.00  --  1.13  APTT  --  39*  --     BMP: Recent Labs    03/02/18 1206 06/09/18 1618 06/10/18 0406 06/11/18 0554  NA 141 140 140 138  K 3.9 4.2 4.1 4.6  CL 108 104 106 108  CO2 _1 GLUCOSE 87 120* 93 108*  BUN _2 CALCIUM 9.2 8.9 8.4* 8.6*  CREATININE 1.19 1.25* 1.11 0.96  GFRNONAA >60 56* >60 >60  GFRAA >60 >60 >60 >60    LIVER FUNCTION TESTS: Recent Labs    08/08/17 1247 06/09/18 1617 06/10/18 0406  BILITOT 0.69 0.6 0.5  AST _3 ALT 17 19 18  ALKPHOS 84 79 65  PROT 6.9 7.3 6.0*  ALBUMIN 3.0* 3.3* 2.7*    TUMOR MARKERS: No results for input(s): AFPTM, CEA, CA199, CHROMGRNA in the last 8760 hours.  Assessment and Plan:  Pancytopenia - patient presented  to [facility] ED for same after visit with PCP for leg swelling and concern for DVT. Patient has history of DVT and is currently on Eliquis - last dose Thursday 8/22 per patient. [place] were negative for DVT, however blood work revealed pancytopenia. At time of presentation H/H 9.6/30.3, plt 10, WBC 1.6 - he received platelet infusion on 8/24, smear (-) for TTP/HUS/DIC; today H/H 7.6/24.1, plt 21, WBC 2.5 - plan for 1 unit PRBC today per heme/onc. Request to IR for bone marrow biopsy to evaluate pancytopenia - patient reports he is very familiar with bone marrow biopsy process as his wife has leukemia and she has had several; he is agreeable to proceed with biopsy tomorrow morning.  Risks and benefits discussed with the patient including, but not limited to bleeding, infection, damage to adjacent structures or low yield requiring additional tests.  All of the patient's questions were answered, patient is agreeable to proceed.  Consent signed and in chart.  Thank you for this interesting consult.  I greatly enjoyed meeting ANMOL FLECK and look forward to participating in their care.  A copy of this report was sent to the requesting provider on this date.  Electronically Signed: Joaquim Nam, PA-C 06/12/2018, 10:45 AM   I spent a total of 40 Minutes in face to face in clinical consultation, greater than 50% of which was counseling/coordinating care for bone marrow biopsy.

## 2018-06-12 NOTE — Care Management Important Message (Signed)
Important Message  Patient Details  Name: Kenneth [name] MRN: [id number] Date of Birth: [birth year]   Medicare Important Message Given:  Yes    Kerin Salen 06/12/2018, 10:13 AMImportant Message  Patient Details  Name: Kenneth [name] MRN: [id number] Date of Birth: 10/23/1945   Medicare Important Message Given:  Yes    Kerin Salen 06/12/2018, 10:13 AM

## 2018-06-12 NOTE — Progress Notes (Addendum)
Occupational Therapy Treatment Patient Details Name: KELVON GIANNINI MRN: [id number] DOB: 1946-02-16 Today's Date: 06/12/2018    History of present illness Pt was admitted with pantocyopenia from MD office.  He has had increased fatique and DOE.  PMH:  NSCLC, CAD, RA, and COPD.   OT comments  Pt progressing towards OT goals. Pt completing UB/LB ADLs seated EOB during session. Pt requiring setup assist for UB ADL, up to [place] for LB ADLs. Pt requiring minguard-minA for static balance during standing tasks, requires intermittent seated rest breaks due to increased fatigue with activity. Feel POC remains appropriate. Will continue to follow acutely to progress pt towards established OT goals.   Follow Up Recommendations  Home health OT;Supervision/Assistance - 24 hour    Equipment Recommendations  None recommended by OT          Precautions / Restrictions Precautions Precautions: Fall Restrictions Weight Bearing Restrictions: No       Mobility Bed Mobility Overal bed mobility: Modified Independent             General bed mobility comments: HOB raised, +rail, increased time and effort  Transfers Overall transfer level: Needs assistance Equipment used: None Transfers: Sit to/from Stand Sit to Stand: Min guard         General transfer comment: increased time and effort    Balance                                           ADL either performed or assessed with clinical judgement   ADL Overall ADL's : Needs assistance/impaired     Grooming: Set up;Sitting;Wash/dry face   Upper Body Bathing: Min guard;Sitting   Lower Body Bathing: Minimal assistance;Sit to/from stand Lower Body Bathing Details (indicate cue type and reason): intermittent minA standing balance  Upper Body Dressing : Set up;Sitting Upper Body Dressing Details (indicate cue type and reason): doffing/donning new gown  Lower Body Dressing: Moderate assistance Lower Body Dressing  Details (indicate cue type and reason): doffing/donning socks and pants, assist to thread LEs into pantlegs; minguard-minA standing balance              Functional mobility during ADLs: Min guard;Minimal assistance General ADL Comments: pt completed UB/LB bathing and dressing seated EOB, educated on UB/LB exercises to complete during down time while in bed and encouraged continued OOB to chair; son present and also engaged during session, assisting with certain parts of ADL     Vision       Perception     Praxis      Cognition Arousal/Alertness: Awake/alert Behavior During Therapy: WFL for tasks assessed/performed Overall Cognitive Status: Within Functional Limits for tasks assessed                                          Exercises     Shoulder Instructions       General Comments      Pertinent Vitals/ Pain       Pain Assessment: Faces Faces Pain Scale: Hurts little more Pain Location: generalized  Pain Descriptors / Indicators: Sore;Guarding Pain Intervention(s): Monitored during session;Patient requesting pain meds-RN notified  Home Living  Prior Functioning/Environment              Frequency  Min 2X/week        Progress Toward Goals  OT Goals(current goals can now be found in the care plan section)  Progress towards OT goals: Progressing toward goals  Acute Rehab OT Goals Patient Stated Goal: home OT Goal Formulation: With patient Time For Goal Achievement: 06/24/18 Potential to Achieve Goals: Good  Plan Discharge plan remains appropriate    Co-evaluation                 AM-PAC PT "6 Clicks" Daily Activity     Outcome Measure   Help from another person eating meals?: None Help from another person taking care of personal grooming?: A Little Help from another person toileting, which includes using toliet, bedpan, or urinal?: A Little Help from another  person bathing (including washing, rinsing, drying)?: A Little Help from another person to put on and taking off regular upper body clothing?: A Little Help from another person to put on and taking off regular lower body clothing?: A Little 6 Click Score: 19    End of Session    OT Visit Diagnosis: Muscle weakness (generalized) (M62.81)   Activity Tolerance Patient tolerated treatment well   Patient Left in bed;with call bell/phone within reach;with bed alarm set   Nurse Communication Mobility status;Patient requests pain meds        Time: 6701-4103 OT Time Calculation (min): 38 min  Charges: OT General Charges $OT Visit: 1 Visit OT Treatments $Self Care/Home Management : 23-37 mins  Lou Cal, [place] Pager [phone] 06/12/2018    Raymondo Band 06/12/2018, 4:37 PM

## 2018-06-12 NOTE — Progress Notes (Signed)
PROGRESS NOTE  Kenneth [name] RCV:[id number] DOB: Aug 25, 1946 DOA: 06/09/2018 PCP: [name], MD  HPI/Recap of past 60 hours: 72 year old male with medical history significant for long CAD, history of DVT, rheumatoid arthritis, hyperlipidemia, tobacco abuse, COPD, presents to the ER after being instructed by his PCP due to pancytopenia.  Patient reported excessive fatigue, dyspnea on exertion for the last few months.  Patient also reported right lower extremity swelling.  Patient presented to his PCP on 06/09/2018 with CBC showing a white count of 2.6, hemoglobin of 10 and platelet count of 12,000.  Patient was referred to the ER.  In the ER, repeat CBC showed a white count of 1.6, platelet count of 10,000, hemoglobin of 9.6. Patient reported for the last 3 months, he was instructed to increase his dose of methotrexate as well as folic acid.  Patient took increased dose of methotrexate but stopped taking his folic acid attributing that to nausea.  Hematology/oncology consulted.  Patient admitted for further management.   Today, patient denies any new complaints. No active bleeding.  Assessment/Plan: Active Problems:   Protein-calorie malnutrition, severe   Non-small cell cancer of left lung (HCC)   Pancytopenia (HCC)   HLD (hyperlipidemia)   Rheumatoid arthritis (HCC)   Cellulitis of right leg   Malnutrition of moderate degree  Pancytopenia Recently detected this month at PCP office, presenting with generalized fatigue Likely due to deficiency of folic acid and Z02  Vs methotrexate toxicity Vs leukemia Folate levels low at 4.5, vitamin B12 level 357 Heme-onc on board: Recommend folic acid and vitamin B12 supplementation. Plan for bone marrow biopsy on 06/13/18 by IR Methotrexate level pending, continue to hold methotrexate Continue folic acid 5 mg daily, vitamin B12 Daily CBC, monitor closely  Thrombocytopenia Status post platelet transfusion on 06/10/2018 DIC panel is  negative Peripheral smear did not show any schistocytes to support TTP/HUS/DIC Continue to hold Eliquis Daily CBC  Macrocytic anemia Likely due to vitamin B12, folate deficiency Hgb down to 7.6, will transfuse with 1PRBC Continue vitamin B12 and folate supplementation Daily CBC  ?Cellulitis of right leg Afebrile, with leukopenia LA WNL BC X 2 NGTD Continue IV Rocephin X 5 days  COPD Stable Continue home inhalers, Symbicort, Spiriva  History of DVT Hold Eliquis for now  Non-small cell cancer of left lung Diagnosed in 2018, status post radiation therapy concluded in Jan 2019 Outpt follow up  Rheumatoid arthritis Follows with outside rheumatologist Continue to hold home methotrexate, continue prednisone  Tobacco abuse Advised to quit Nicotine patch     Code Status: DNR  Family Communication: None at bedside  Disposition Plan: Once work-up is complete   Consultants:  Heme-onc  IR for bone marrow biopsy  Procedures:  None  Antimicrobials:  IV ceftriaxone  DVT prophylaxis: SCDs   Objective: Vitals:   06/12/18 0628 06/12/18 0713 06/12/18 0754 06/12/18 1353  BP: 134/63   123/66  Pulse: 79   70  Resp: 16   18  Temp: 98.5 F (36.9 C) 99.4 F (37.4 C)  98.1 F (36.7 C)  TempSrc: Oral Oral  Oral  SpO2: 97%  93% 99%  Weight:      Height:        Intake/Output Summary (Last 24 hours) at 06/12/2018 1449 Last data filed at 06/12/2018 1213 Gross per 24 hour  Intake 113.51 ml  Output -  Net 113.51 ml   Filed Weights   06/09/18 1648  Weight: 67.4 kg    Exam:   General:  NAD  Cardiovascular: S1, S2 present  Respiratory: Diminished breath sounds bilaterally  Abdomen: Soft, nontender, nondistended, bowel sounds present  Musculoskeletal: Right lower extremity with some erythema around the shin, no pedal edema noted  Skin: Mild RLE erythema around the shin  Psychiatry: Normal mood   Data Reviewed: CBC: Recent Labs  Lab  06/09/18 1618 06/10/18 0406 06/10/18 0555 06/10/18 0822 06/11/18 0554 06/12/18 0501  WBC 1.6* 2.0*  --   --  2.3* 2.5*  NEUTROABS 0.8*  --   --   --  1.1* 1.1*  HGB 9.6* 7.8* 6.6* 9.3* 8.4* 7.6*  HCT 30.3* 23.7* 19.0* 29.2* 26.0* 24.1*  MCV 107.8* 107.7*  --   --  107.4* 106.2*  PLT 10* 28*  --   --  19* 21*   Basic Metabolic Panel: Recent Labs  Lab 06/09/18 1617 06/09/18 1618 06/10/18 0406 06/11/18 0554  NA  --  140 140 138  K  --  4.2 4.1 4.6  CL  --  104 106 108  CO2  --  _0 GLUCOSE  --  120* 93 108*  BUN  --  _1 CREATININE  --  1.25* 1.11 0.96  CALCIUM  --  8.9 8.4* 8.6*  MG 2.1  --  2.1  --   PHOS 2.5  --  3.7  --    GFR: Estimated Creatinine Clearance: 56.8 mL/min (by C-G formula based on SCr of 0.96 mg/dL). Liver Function Tests: Recent Labs  Lab 06/09/18 1617 06/10/18 0406  AST 26 20  ALT 19 18  ALKPHOS 79 65  BILITOT 0.6 0.5  PROT 7.3 6.0*  ALBUMIN 3.3* 2.7*   No results for input(s): LIPASE, AMYLASE in the last 168 hours. No results for input(s): AMMONIA in the last 168 hours. Coagulation Profile: Recent Labs  Lab 06/09/18 1618  INR 1.13   Cardiac Enzymes: No results for input(s): CKTOTAL, CKMB, CKMBINDEX, TROPONINI in the last 168 hours. BNP (last 3 results) No results for input(s): PROBNP in the last 8760 hours. HbA1C: No results for input(s): HGBA1C in the last 72 hours. CBG: No results for input(s): GLUCAP in the last 168 hours. Lipid Profile: No results for input(s): CHOL, HDL, LDLCALC, TRIG, CHOLHDL, LDLDIRECT in the last 72 hours. Thyroid Function Tests: Recent Labs    06/10/18 0406  TSH 0.522   Anemia Panel: Recent Labs    06/09/18 1617  VITAMINB12 357  FOLATE 4.5*  FERRITIN 709*  TIBC 237*  IRON 98  RETICCTPCT <0.4*   Urine analysis:    Component Value Date/Time   COLORURINE AMBER (A) 06/10/2018 0250   APPEARANCEUR CLEAR 06/10/2018 0250   LABSPEC 1.024 06/10/2018 0250   PHURINE 5.0 06/10/2018 0250    GLUCOSEU NEGATIVE 06/10/2018 0250   HGBUR SMALL (A) 06/10/2018 0250   BILIRUBINUR NEGATIVE 06/10/2018 0250   KETONESUR NEGATIVE 06/10/2018 0250   PROTEINUR NEGATIVE 06/10/2018 0250   NITRITE NEGATIVE 06/10/2018 0250   LEUKOCYTESUR NEGATIVE 06/10/2018 0250   Sepsis Labs: _2 (procalcitonin:4,lacticidven:4)  ) Recent Results (from the past 240 hour(s))  Culture, blood (Routine X 2) w Reflex to ID Panel     Status: None (Preliminary result)   Collection Time: 06/09/18  9:15 PM  Result Value Ref Range Status   Specimen Description   Final    BLOOD BLOOD LEFT ARM Performed at [facility], [place] [address] [place], [place] [address]    Special Requests   Final    BOTTLES DRAWN  AEROBIC AND ANAEROBIC Blood Culture adequate volume Performed at [place] [address] [place], [place] [address]    Culture   Final    NO GROWTH 3 DAYS Performed at [facility], [place] [address] [place], [place] [address]    Report Status PENDING  Incomplete  Culture, blood (Routine X 2) w Reflex to ID Panel     Status: None (Preliminary result)   Collection Time: 06/09/18  9:16 PM  Result Value Ref Range Status   Specimen Description   Final    BLOOD BLOOD LEFT ARM Performed at [place] [address], [place], [place] [address]    Special Requests   Final    BOTTLES DRAWN AEROBIC AND ANAEROBIC Blood Culture adequate volume Performed at [place] [address] [place], [place] [address]    Culture   Final    NO GROWTH 3 DAYS Performed at [facility], [place] [address], [place], [place] [address]    Report Status PENDING  Incomplete  MRSA PCR Screening     Status: None   Collection Time: 06/10/18  2:50 AM  Result Value Ref Range Status   MRSA by PCR NEGATIVE NEGATIVE Final    Comment:        The GeneXpert MRSA Assay (FDA approved for NASAL specimens only), is one component of  a comprehensive MRSA colonization surveillance program. It is not intended to diagnose MRSA infection nor to guide or monitor treatment for MRSA infections. Performed at [facility], [place] [address], [place], [place] [address]       Studies: No results found.  Scheduled Meds: . sodium chloride   Intravenous Once  . sodium chloride   Intravenous Once  . feeding supplement (ENSURE ENLIVE)  237 mL Oral BID BM  . folic acid  5 mg Oral Daily  . mometasone-formoterol  2 puff Inhalation BID  . predniSONE  2.5 mg Oral Daily  . rosuvastatin  10 mg Oral QPM  . senna  1 tablet Oral BID  . tiotropium  1 capsule Inhalation Daily  . valACYclovir  500 mg Oral BID  . vitamin B-12  1,000 mcg Oral Daily    Continuous Infusions: . sodium chloride Stopped (06/12/18 0049)  . cefTRIAXone (ROCEPHIN)  IV Stopped (06/11/18 2329)     LOS: 3 days     Alma Friendly, MD [facility]   If 7PM-7AM, please contact night-coverage [email or web address] Password [email or web address] 06/12/2018, 2:49 PM

## 2018-06-12 NOTE — Progress Notes (Signed)
IP PROGRESS NOTE  Subjective:   Kenneth [name] reports no major changes in his health.  He denies any bleeding complications overnight.  He still quite debilitated and weak overall.  He denies any fevers or chills or sweats.  Objective:  Vital signs in last 24 hours: Temp:  [98.1 F (36.7 C)-99.4 F (37.4 C)] 99.4 F (37.4 C) (08/26 0713) Pulse Rate:  [78-85] 79 (08/26 0628) Resp:  [16-18] 16 (08/26 0628) BP: (134-139)/(63-76) 134/63 (08/26 0628) SpO2:  [87 %-97 %] 93 % (08/26 0754) Weight change:  Last BM Date: 06/09/18  Intake/Output from previous day: 08/25 0701 - 08/26 0700 In: 425.2 [I.V.:325.2; IV Piggyback:100] Out: -  General: Alert, awake without distress. Head: Normocephalic without abnormalities. Mouth: Mucous membranes are moist and pink. Eyes: Sclera anicteric. Resp: Clear without any wheezes or dullness to percussion. Cardio: regular rate and rhythm, S1-S2 without leg edema. GI: soft, there are, nondistended without rebound or guarding. Musculoskeletal: No clubbing or cyanosis. Neurological: No motor or sensory deficits. Skin: Ecchymosis noted on his arms.    Lab Results: Recent Labs    06/11/18 0554 06/12/18 0501  WBC 2.3* 2.5*  HGB 8.4* 7.6*  HCT 26.0* 24.1*  PLT 19* 21*    BMET Recent Labs    06/10/18 0406 06/11/18 0554  NA 140 138  K 4.1 4.6  CL 106 108  CO2 26 24  GLUCOSE 93 108*  BUN 22 17  CREATININE 1.11 0.96  CALCIUM 8.4* 8.6*       Medications: I have reviewed the patient's current medications.  Assessment/Plan:  1.  Pancytopenia: Differential diagnosis includes methotrexate toxicity, folate deficiency or acute bone marrow process.  Risks and benefits of proceeding with a bone marrow biopsy was reviewed again and is agreeable to proceed.  Once this completed, we will have a better idea of the etiology as well as the prognosis.   2.  Anemia: Related to his primary etiology of his pancytopenia.  His hemoglobin is 7.6 and I  recommend packed red cell transfusion to keep his hemoglobin above 7.   3.  Thrombocytopenia: No active bleeding noted I would transfuse to keep his platelet count above 10,000.  4.  Disposition: He is not ready for discharge given the fragility of his counts and his transfusion needs.  We will continue to follow with you.   25  minutes was  face-to-face with the patient today.  More than 50% of time was dedicated to discussing the natural course of these findings as well as future management plan..     LOS: 3 days   Zola Button 06/12/2018, 8:04 AMPatient ID: Kenneth [name], male   DOB: 1946/03/23, 72 y.o.   MRN: [id number]

## 2018-06-13 ENCOUNTER — Encounter (HOSPITAL_COMMUNITY): Payer: Self-pay | Admitting: Radiology

## 2018-06-13 ENCOUNTER — Inpatient Hospital Stay (HOSPITAL_COMMUNITY): Payer: Medicare Other

## 2018-06-13 LAB — CBC WITH DIFFERENTIAL/PLATELET
BASOS PCT: 0 %
Basophils Absolute: 0 10*3/uL (ref 0.0–0.1)
EOS PCT: 26 %
Eosinophils Absolute: 0.6 10*3/uL (ref 0.0–0.7)
HEMATOCRIT: 26 % — AB (ref 39.0–52.0)
Hemoglobin: 8.5 g/dL — ABNORMAL LOW (ref 13.0–17.0)
LYMPHS ABS: 0.9 10*3/uL (ref 0.7–4.0)
Lymphocytes Relative: 41 %
MCH: 33.2 pg (ref 26.0–34.0)
MCHC: 32.7 g/dL (ref 30.0–36.0)
MCV: 101.6 fL — AB (ref 78.0–100.0)
MONO ABS: 0 10*3/uL — AB (ref 0.1–1.0)
Monocytes Relative: 1 %
NEUTROS ABS: 0.7 10*3/uL — AB (ref 1.7–7.7)
Neutrophils Relative %: 32 %
PLATELETS: 16 10*3/uL — AB (ref 150–400)
RBC: 2.56 MIL/uL — ABNORMAL LOW (ref 4.22–5.81)
RDW: 17.5 % — AB (ref 11.5–15.5)
WBC: 2.2 10*3/uL — ABNORMAL LOW (ref 4.0–10.5)

## 2018-06-13 LAB — TYPE AND SCREEN
ABO/RH(D): AB POS
ANTIBODY SCREEN: NEGATIVE
UNIT DIVISION: 0

## 2018-06-13 LAB — BPAM RBC
Blood Product Expiration Date: 201909192359
ISSUE DATE / TIME: 201908261838
Unit Type and Rh: 6200

## 2018-06-13 LAB — HEMOGLOBIN AND HEMATOCRIT, BLOOD
HCT: 25.7 % — ABNORMAL LOW (ref 39.0–52.0)
Hemoglobin: 8.5 g/dL — ABNORMAL LOW (ref 13.0–17.0)

## 2018-06-13 LAB — METHOTREXATE

## 2018-06-13 MED ORDER — FENTANYL CITRATE (PF) 100 MCG/2ML IJ SOLN
INTRAMUSCULAR | Status: AC
  Filled 2018-06-13: qty 4

## 2018-06-13 MED ORDER — NALOXONE HCL 0.4 MG/ML IJ SOLN
INTRAMUSCULAR | Status: AC
  Filled 2018-06-13: qty 1

## 2018-06-13 MED ORDER — MIDAZOLAM HCL 2 MG/2ML IJ SOLN
INTRAMUSCULAR | Status: AC
  Filled 2018-06-13: qty 4

## 2018-06-13 MED ORDER — MIDAZOLAM HCL 2 MG/2ML IJ SOLN
INTRAMUSCULAR | Status: AC | PRN
  Administered 2018-06-13 (×2): 1 mg via INTRAVENOUS

## 2018-06-13 MED ORDER — FENTANYL CITRATE (PF) 100 MCG/2ML IJ SOLN
INTRAMUSCULAR | Status: AC | PRN
  Administered 2018-06-13 (×2): 50 ug via INTRAVENOUS

## 2018-06-13 MED ORDER — AMLODIPINE BESYLATE 5 MG PO TABS
5.0000 mg | ORAL_TABLET | Freq: Every day | ORAL | Status: DC
  Administered 2018-06-13 – 2018-06-15 (×3): 5 mg via ORAL
  Filled 2018-06-13 (×3): qty 1

## 2018-06-13 MED ORDER — FLUMAZENIL 0.5 MG/5ML IV SOLN
INTRAVENOUS | Status: AC
  Filled 2018-06-13: qty 5

## 2018-06-13 NOTE — Progress Notes (Signed)
Events noted last 24 hours.  Laboratory data were reviewed and not dramatically changed.  Bone marrow biopsy is tentatively scheduled for 06/14/2018.  His methotrexate level is very low which indicates less likely methotrexate toxicity.  I recommend continued supportive management and monitor his counts daily and transfuse for platelets less than 10,000 and hemoglobin of less than 7.   We will continue to follow with you and add further recommendations after his bone marrow biopsy. 

## 2018-06-13 NOTE — Progress Notes (Signed)
PROGRESS NOTE  Kenneth [name] HGD:[id number] DOB: 1945-12-29 DOA: 06/09/2018 PCP: [name], MD  HPI/Recap of past 11 hours: 72 year old male with medical history significant for long CAD, history of DVT, rheumatoid arthritis, hyperlipidemia, tobacco abuse, COPD, presents to the ER after being instructed by his PCP due to pancytopenia.  Patient reported excessive fatigue, dyspnea on exertion for the last few months.  Patient also reported right lower extremity swelling.  Patient presented to his PCP on 06/09/2018 with CBC showing a white count of 2.6, hemoglobin of 10 and platelet count of 12,000.  Patient was referred to the ER.  In the ER, repeat CBC showed a white count of 1.6, platelet count of 10,000, hemoglobin of 9.6. Patient reported for the last 3 months, he was instructed to increase his dose of methotrexate as well as folic acid.  Patient took increased dose of methotrexate but stopped taking his folic acid attributing that to nausea.  Hematology/oncology consulted.  Patient admitted for further management.   Today, patient denies any new complaints. No active bleeding. Had bone marrow biopsy done today.   Assessment/Plan: Active Problems:   Protein-calorie malnutrition, severe   Non-small cell cancer of left lung (HCC)   Pancytopenia (HCC)   HLD (hyperlipidemia)   Rheumatoid arthritis (HCC)   Cellulitis of right leg   Malnutrition of moderate degree  Pancytopenia Recently detected this month at PCP office, presenting with generalized fatigue Likely due to deficiency of folic acid and D62 Vs leukemia Folate levels low at 4.5, vitamin B12 level 357 Heme-onc on board: Recommend folic acid and vitamin B12 supplementation Had bone marrow biopsy on 06/13/18 by IR, awaiting result  Methotrexate level <0.04 (continue to hold methotrexate for now until hematologist is ok to restart) Continue folic acid 5 mg daily, vitamin B12 Daily CBC, monitor closely  Thrombocytopenia Status post  platelet transfusion on 06/10/2018 DIC panel is negative Peripheral smear did not show any schistocytes to support TTP/HUS/DIC Continue to hold Eliquis May transfuse PLT if <10,000 Daily CBC  Macrocytic anemia Likely due to vitamin B12, folate deficiency Hgb down to 7.6, s/p transfusion with 1PRBC on 06/12/18 Continue vitamin B12 and folate supplementation Daily CBC  Cellulitis of right leg Afebrile, with leukopenia LA WNL BC X 2 NGTD S/P IV Rocephin X 5 days  HTN Continue home amlodipine  COPD Stable Continue home inhalers, Symbicort, Spiriva  History of DVT Hold Eliquis for now  Non-small cell cancer of left lung Diagnosed in 2018, status post radiation therapy concluded in Jan 2019 Outpt follow up  Rheumatoid arthritis Follows with outside rheumatologist Continue to hold home methotrexate, continue prednisone  Tobacco abuse Advised to quit Nicotine patch     Code Status: DNR  Family Communication: None at bedside  Disposition Plan: Once work-up is complete   Consultants:  Heme-onc  IR for bone marrow biopsy  Procedures:  None  Antimicrobials:  S/P IV ceftriaxone  DVT prophylaxis: SCDs   Objective: Vitals:   06/13/18 0737 06/13/18 0854 06/13/18 0906 06/13/18 0910  BP:  (!) 175/90 (!) 163/72 (!) 150/71  Pulse:  94 91 86  Resp:  (!) 24 (!) 24 (!) 23  Temp:      TempSrc:      SpO2: 98% 95% 92% 92%  Weight:      Height:        Intake/Output Summary (Last 24 hours) at 06/13/2018 1430 Last data filed at 06/13/2018 1412 Gross per 24 hour  Intake 1100.1 ml  Output 1  ml  Net 1099.1 ml   Filed Weights   06/09/18 1648  Weight: 67.4 kg    Exam:   General: NAD  Cardiovascular: S1, S2 present  Respiratory: Diminished breath sounds bilaterally  Abdomen: Soft, nontender, nondistended, bowel sounds present  Musculoskeletal: Right lower extremity with resolved erythema around the shin, no pedal edema noted  Skin: Resolved RLE  erythema around the shin  Psychiatry: Normal mood   Data Reviewed: CBC: Recent Labs  Lab 06/09/18 1618 06/10/18 0406  06/10/18 8768 06/11/18 0554 06/12/18 0501 06/13/18 0020 06/13/18 0329  WBC 1.6* 2.0*  --   --  2.3* 2.5*  --  2.2*  NEUTROABS 0.8*  --   --   --  1.1* 1.1*  --  0.7*  HGB 9.6* 7.8*   < > 9.3* 8.4* 7.6* 8.5* 8.5*  HCT 30.3* 23.7*   < > 29.2* 26.0* 24.1* 25.7* 26.0*  MCV 107.8* 107.7*  --   --  107.4* 106.2*  --  101.6*  PLT 10* 28*  --   --  19* 21*  --  16*   < > = values in this interval not displayed.   Basic Metabolic Panel: Recent Labs  Lab 06/09/18 1617 06/09/18 1618 06/10/18 0406 06/11/18 0554  NA  --  140 140 138  K  --  4.2 4.1 4.6  CL  --  104 106 108  CO2  --  '27 26 24  ' GLUCOSE  --  120* 93 108*  BUN  --  '23 22 17  ' CREATININE  --  1.25* 1.11 0.96  CALCIUM  --  8.9 8.4* 8.6*  MG 2.1  --  2.1  --   PHOS 2.5  --  3.7  --    GFR: Estimated Creatinine Clearance: 56.8 mL/min (by C-G formula based on SCr of 0.96 mg/dL). Liver Function Tests: Recent Labs  Lab 06/09/18 1617 06/10/18 0406  AST 26 20  ALT 19 18  ALKPHOS 79 65  BILITOT 0.6 0.5  PROT 7.3 6.0*  ALBUMIN 3.3* 2.7*   No results for input(s): LIPASE, AMYLASE in the last 168 hours. No results for input(s): AMMONIA in the last 168 hours. Coagulation Profile: Recent Labs  Lab 06/09/18 1618  INR 1.13   Cardiac Enzymes: No results for input(s): CKTOTAL, CKMB, CKMBINDEX, TROPONINI in the last 168 hours. BNP (last 3 results) No results for input(s): PROBNP in the last 8760 hours. HbA1C: No results for input(s): HGBA1C in the last 72 hours. CBG: No results for input(s): GLUCAP in the last 168 hours. Lipid Profile: No results for input(s): CHOL, HDL, LDLCALC, TRIG, CHOLHDL, LDLDIRECT in the last 72 hours. Thyroid Function Tests: No results for input(s): TSH, T4TOTAL, FREET4, T3FREE, THYROIDAB in the last 72 hours. Anemia Panel: No results for input(s): VITAMINB12, FOLATE,  FERRITIN, TIBC, IRON, RETICCTPCT in the last 72 hours. Urine analysis:    Component Value Date/Time   COLORURINE AMBER (A) 06/10/2018 0250   APPEARANCEUR CLEAR 06/10/2018 0250   LABSPEC 1.024 06/10/2018 0250   PHURINE 5.0 06/10/2018 0250   GLUCOSEU NEGATIVE 06/10/2018 0250   HGBUR SMALL (A) 06/10/2018 0250   BILIRUBINUR NEGATIVE 06/10/2018 0250   KETONESUR NEGATIVE 06/10/2018 0250   PROTEINUR NEGATIVE 06/10/2018 0250   NITRITE NEGATIVE 06/10/2018 0250   LEUKOCYTESUR NEGATIVE 06/10/2018 0250   Sepsis Labs: '@LABRCNTIP' (procalcitonin:4,lacticidven:4)  ) Recent Results (from the past 240 hour(s))  Culture, blood (Routine X 2) w Reflex to ID Panel     Status: None (Preliminary result)  Collection Time: 06/09/18  9:15 PM  Result Value Ref Range Status   Specimen Description   Final    BLOOD BLOOD LEFT ARM Performed at [facility] [address], [place], [place] [address]    Special Requests   Final    BOTTLES DRAWN AEROBIC AND ANAEROBIC Blood Culture adequate volume Performed at [facility] [address] [place], [place] [address]    Culture   Final    NO GROWTH 4 DAYS Performed at [facility], [place] [address], [place], [place] [address]    Report Status PENDING  Incomplete  Culture, blood (Routine X 2) w Reflex to ID Panel     Status: None (Preliminary result)   Collection Time: 06/09/18  9:16 PM  Result Value Ref Range Status   Specimen Description   Final    BLOOD BLOOD LEFT ARM Performed at [facility] [address] [place], [place] [address]    Special Requests   Final    BOTTLES DRAWN AEROBIC AND ANAEROBIC Blood Culture adequate volume Performed at [facility] [address], [place], [place] [address]    Culture   Final    NO GROWTH 4 DAYS Performed at [facility], [place] [address] [place], [place] [address]    Report Status PENDING  Incomplete  MRSA PCR Screening      Status: None   Collection Time: 06/10/18  2:50 AM  Result Value Ref Range Status   MRSA by PCR NEGATIVE NEGATIVE Final    Comment:        The GeneXpert MRSA Assay (FDA approved for NASAL specimens only), is one component of a comprehensive MRSA colonization surveillance program. It is not intended to diagnose MRSA infection nor to guide or monitor treatment for MRSA infections. Performed at [facility], [place] [address] [place], [place] [address]       Studies: Ct Biopsy  Result Date: 06-30-18 CLINICAL DATA:  Pancytopenia EXAM: CT GUIDED DEEP ILIAC BONE ASPIRATION AND CORE BIOPSY TECHNIQUE: Patient was placed supine on the CT gantry and limited axial scans through the pelvis were obtained. Appropriate skin entry site was identified. Skin site was marked, prepped with chlorhexidine, draped in usual sterile fashion, and infiltrated locally with 1% lidocaine. Intravenous Fentanyl and Versed were administered as conscious sedation during continuous monitoring of the patient's level of consciousness and physiological / cardiorespiratory status by the radiology RN, with a total moderate sedation time of 10 minutes. Under CT fluoroscopic guidance an 11-gauge Cook trocar bone needle was advanced into the right iliac bone just lateral to the sacroiliac joint. Once needle tip position was confirmed, core and aspiration samples were obtained, submitted to pathology for approval. Post procedure scans show no hematoma or fracture. Patient tolerated procedure well. COMPLICATIONS: COMPLICATIONS none IMPRESSION: 1. Technically successful CT guided right iliac bone core and aspiration biopsy. Electronically Signed   By: Lucrezia Europe M.D.   On: 06/30/2018 12:32   Ct Bone Marrow Biopsy & Aspiration  Result Date: 2018-06-30 CLINICAL DATA:  Pancytopenia EXAM: CT GUIDED DEEP ILIAC BONE ASPIRATION AND CORE BIOPSY TECHNIQUE: Patient was placed supine on the CT gantry and limited axial scans  through the pelvis were obtained. Appropriate skin entry site was identified. Skin site was marked, prepped with chlorhexidine, draped in usual sterile fashion, and infiltrated locally with 1% lidocaine. Intravenous Fentanyl and Versed were administered as conscious sedation during continuous monitoring of the patient's  level of consciousness and physiological / cardiorespiratory status by the radiology RN, with a total moderate sedation time of 10 minutes. Under CT fluoroscopic guidance an 11-gauge Cook trocar bone needle was advanced into the right iliac bone just lateral to the sacroiliac joint. Once needle tip position was confirmed, core and aspiration samples were obtained, submitted to pathology for approval. Post procedure scans show no hematoma or fracture. Patient tolerated procedure well. COMPLICATIONS: COMPLICATIONS none IMPRESSION: 1. Technically successful CT guided right iliac bone core and aspiration biopsy. Electronically Signed   By: Lucrezia Europe M.D.   On: 06/13/2018 12:32    Scheduled Meds: . sodium chloride   Intravenous Once  . feeding supplement (ENSURE ENLIVE)  237 mL Oral BID BM  . fentaNYL      . folic acid  5 mg Oral Daily  . midazolam      . mometasone-formoterol  2 puff Inhalation BID  . predniSONE  2.5 mg Oral Daily  . rosuvastatin  10 mg Oral QPM  . senna  1 tablet Oral BID  . tiotropium  1 capsule Inhalation Daily  . valACYclovir  500 mg Oral BID  . vitamin B-12  1,000 mcg Oral Daily    Continuous Infusions: . sodium chloride Stopped (06/12/18 2255)  . cefTRIAXone (ROCEPHIN)  IV Stopped (06/12/18 2230)     LOS: 4 days     Alma Friendly, MD [facility]   If 7PM-7AM, please contact night-coverage [email or web address] Password [email or web address] 06/13/2018, 2:30 PM

## 2018-06-13 NOTE — Progress Notes (Signed)
Physical Therapy Treatment Patient Details Name: Kenneth [name] MRN: [id number] DOB: [birth year] Today's Date: 06/13/2018    History of Present Illness Pt was admitted with pantocyopenia from MD office.  He has had increased fatique and DOE.  PMH:  NSCLC, CAD, RA, and COPD.    PT Comments    Pt on 3L O2 via [place] when PT arrived to room, maintained O2 while ambulating. Pt ready to mobilize upon PT arrival to room. Pt ambulated 220 ft with min guard assist, requiring standing rest breaks due to O2sats dropping to 87% and dyspnea. Pt progressing mobility, will continue to progress as able. D/c plan remains appropriate. Will continue to follow acutely.  O2sats sitting EOB: 94% O2sats during ambulation: 87-91%  HR during session: 90s   Follow Up Recommendations  No PT follow up;Supervision for mobility/OOB     Equipment Recommendations  None recommended by PT    Recommendations for Other Services       Precautions / Restrictions Precautions Precautions: Fall Restrictions Weight Bearing Restrictions: No    Mobility  Bed Mobility Overal bed mobility: Modified Independent             General bed mobility comments: HOB raised, uses rail for rolling, increased time to perform   Transfers Overall transfer level: Needs assistance Equipment used: None Transfers: Sit to/from Stand Sit to Stand: Min guard         General transfer comment: increased time to complete, min guard for safety   Ambulation/Gait Ambulation/Gait assistance: Min guard(on 3L O2 via [place] ) Gait Distance (Feet): 220 Feet Assistive device: None Gait Pattern/deviations: Step-through pattern;Decreased stride length;Shuffle Gait velocity: decreased   General Gait Details: Min guard for safety. Required 2 standing rest breaks, once due to dyspnea and once due to O2sat drop to 87%.    Stairs             Wheelchair Mobility    Modified Rankin (Stroke Patients Only)       Balance Overall balance  assessment: Mild deficits observed, not formally tested                                          Cognition Arousal/Alertness: Awake/alert Behavior During Therapy: WFL for tasks assessed/performed Overall Cognitive Status: Within Functional Limits for tasks assessed                                        Exercises General Exercises - Lower Extremity Long Arc Quad: AROM;Both;10 reps;Seated Hip Flexion/Marching: AROM;Both;10 reps;Seated    General Comments        Pertinent Vitals/Pain Pain Assessment: 0-10 Pain Score: 7  Pain Location: Low and middle back  Pain Descriptors / Indicators: Sore;Aching Pain Intervention(s): Limited activity within patient's tolerance;Repositioned;Monitored during session    Home Living                      Prior Function            PT Goals (current goals can now be found in the care plan section) Acute Rehab PT Goals PT Goal Formulation: With patient Time For Goal Achievement: 06/25/18 Potential to Achieve Goals: Good Progress towards PT goals: Progressing toward goals    Frequency    Min 3X/week  PT Plan Current plan remains appropriate    Co-evaluation              AM-PAC PT "6 Clicks" Daily Activity  Outcome Measure  Difficulty turning over in bed (including adjusting bedclothes, sheets and blankets)?: A Little Difficulty moving from lying on back to sitting on the side of the bed? : A Little Difficulty sitting down on and standing up from a chair with arms (e.g., wheelchair, bedside commode, etc,.)?: Unable Help needed moving to and from a bed to chair (including a wheelchair)?: A Little Help needed walking in hospital room?: A Little Help needed climbing 3-5 steps with a railing? : A Little 6 Click Score: 16    End of Session Equipment Utilized During Treatment: Gait belt Activity Tolerance: Patient limited by fatigue Patient left: with call bell/phone within  reach;with family/visitor present;in chair;with chair alarm set Nurse Communication: Mobility status(spoke with the nurse tech, will relay information to RN) PT Visit Diagnosis: Difficulty in walking, not elsewhere classified (R26.2)     Time: 3754-3606 PT Time Calculation (min) (ACUTE ONLY): 23 min  Charges:  $Gait Training: 8-22 mins $Therapeutic Exercise: 8-22 mins                     Jesalyn Finazzo Conception Chancy, PT, DPT  Pager # [phone]     [place] 06/13/2018, 1:01 PM

## 2018-06-13 NOTE — Progress Notes (Signed)
Occupational Therapy Treatment Patient Details Name: Kenneth [name] MRN: [id number] DOB: 21-Jan-1946 Today's Date: 06/13/2018    History of present illness Pt was admitted with pantocyopenia from MD office.  He has had increased fatique and DOE.  PMH:  NSCLC, CAD, RA, and COPD.   OT comments  Pt progressing towards OT goals. Issued pt level1 theraband and reviewed UE HEP this session seated EOB for increasing strength/endurance. Pt completing HEP with min verbal cues throughout and with intermittent rest breaks as pt fatigued with increased activity. Feel POC remains appropriate at this time. Will continue to follow acutely to progress pt towards established OT goals.   Follow Up Recommendations  Home health OT;Supervision/Assistance - 24 hour    Equipment Recommendations  None recommended by OT          Precautions / Restrictions Precautions Precautions: Fall Restrictions Weight Bearing Restrictions: No       Mobility Bed Mobility Overal bed mobility: Modified Independent             General bed mobility comments: HOB raised, uses rail for rolling, increased time to perform   Transfers Overall transfer level: Needs assistance Equipment used: None Transfers: Sit to/from Stand Sit to Stand: Min guard         General transfer comment: increased time to complete, min guard for safety     Balance Overall balance assessment: Mild deficits observed, not formally tested                                         ADL either performed or assessed with clinical judgement   ADL Overall ADL's : Needs assistance/impaired                                       General ADL Comments: issued pt level 1 theraband and reviewed UB exercises seated EOB for continued strengthening/endurance     Vision       Perception     Praxis      Cognition Arousal/Alertness: Awake/alert Behavior During Therapy: WFL for tasks assessed/performed Overall  Cognitive Status: Impaired/Different from baseline Area of Impairment: Memory                     Memory: Decreased short-term memory         General Comments: pt slow to respond to some questions and with noted decreased STM, pt required cues to recall what he had done during PT session earlier today         Exercises General Exercises - Lower Extremity Long Arc Quad: AROM;Both;10 reps;Seated Hip Flexion/Marching: AROM;Both;10 reps;Seated Other Exercises Other Exercises: pt performed UB exercises across multiple planes using level 1 theraband for 5-10 reps each, alternating between L/RUE, seated EOB    Shoulder Instructions       General Comments      Pertinent Vitals/ Pain       Pain Assessment: Faces Pain Score: 7  Faces Pain Scale: Hurts a little bit Pain Location: Low and middle back  Pain Descriptors / Indicators: Sore;Aching Pain Intervention(s): Monitored during session  Home Living  Prior Functioning/Environment              Frequency  Min 2X/week        Progress Toward Goals  OT Goals(current goals can now be found in the care plan section)  Progress towards OT goals: Progressing toward goals  Acute Rehab OT Goals Patient Stated Goal: home OT Goal Formulation: With patient Time For Goal Achievement: 06/24/18 Potential to Achieve Goals: Good ADL Goals Pt/caregiver will Perform Home Exercise Program: Both right and left upper extremity;With theraband;With written HEP provided;With Supervision  Plan Discharge plan remains appropriate    Co-evaluation                 AM-PAC PT "6 Clicks" Daily Activity     Outcome Measure   Help from another person eating meals?: None Help from another person taking care of personal grooming?: A Little Help from another person toileting, which includes using toliet, bedpan, or urinal?: A Little Help from another person bathing  (including washing, rinsing, drying)?: A Little Help from another person to put on and taking off regular upper body clothing?: A Little Help from another person to put on and taking off regular lower body clothing?: A Little 6 Click Score: 19    End of Session Equipment Utilized During Treatment: Other (comment)(theraband )  OT Visit Diagnosis: Muscle weakness (generalized) (M62.81)   Activity Tolerance Patient tolerated treatment well   Patient Left in bed;with call bell/phone within reach;with bed alarm set   Nurse Communication Mobility status        Time: 1898-4210 OT Time Calculation (min): 31 min  Charges: OT General Charges $OT Visit: 1 Visit OT Treatments $Therapeutic Activity: 23-37 mins  Kenneth [name], Kenneth [name] Pager [phone] 06/13/2018   Kenneth [name] 06/13/2018, 4:05 PM

## 2018-06-13 NOTE — Procedures (Signed)
  Procedure: CT bone marrow biopsy R iliac EBL:   minimal Complications:  none immediate  See full dictation in [facility].  Dillard Cannon MD Main # [phone] Pager  [phone]

## 2018-06-14 DIAGNOSIS — L03115 Cellulitis of right lower limb: Secondary | ICD-10-CM

## 2018-06-14 DIAGNOSIS — C3492 Malignant neoplasm of unspecified part of left bronchus or lung: Secondary | ICD-10-CM

## 2018-06-14 LAB — CBC WITH DIFFERENTIAL/PLATELET
BASOS PCT: 0 %
Basophils Absolute: 0 10*3/uL (ref 0.0–0.1)
EOS ABS: 0.7 10*3/uL (ref 0.0–0.7)
Eosinophils Relative: 33 %
HCT: 26.6 % — ABNORMAL LOW (ref 39.0–52.0)
Hemoglobin: 8.7 g/dL — ABNORMAL LOW (ref 13.0–17.0)
Lymphocytes Relative: 38 %
Lymphs Abs: 0.8 10*3/uL (ref 0.7–4.0)
MCH: 33.6 pg (ref 26.0–34.0)
MCHC: 32.7 g/dL (ref 30.0–36.0)
MCV: 102.7 fL — AB (ref 78.0–100.0)
MONOS PCT: 9 %
Monocytes Absolute: 0.2 10*3/uL (ref 0.1–1.0)
NEUTROS ABS: 0.4 10*3/uL — AB (ref 1.7–7.7)
NEUTROS PCT: 20 %
PLATELETS: 30 10*3/uL — AB (ref 150–400)
RBC: 2.59 MIL/uL — ABNORMAL LOW (ref 4.22–5.81)
RDW: 16.5 % — ABNORMAL HIGH (ref 11.5–15.5)
WBC: 2.1 10*3/uL — ABNORMAL LOW (ref 4.0–10.5)

## 2018-06-14 LAB — CULTURE, BLOOD (ROUTINE X 2)
Culture: NO GROWTH
Culture: NO GROWTH
SPECIAL REQUESTS: ADEQUATE
Special Requests: ADEQUATE

## 2018-06-14 NOTE — Progress Notes (Signed)
PT Cancellation Note  Patient Details Name: Kenneth [name] MRN: [id number] DOB: Apr 23, 1946   Cancelled Treatment:    Reason Eval/Treat Not Completed: Patient declined, no reason specified - Pt with LBP at 7/10 with mobility, defers PT at this time. Pt asked to mobilize x2 with refusals both times. Will continue to follow acutely, and follow up as schedule permits.   Julien Girt, PT, DPT  Pager # [phone]     Lysette Lindenbaum D Mathis Cashman 06/14/2018, 2:01 PM

## 2018-06-14 NOTE — Progress Notes (Signed)
Patient ID: Kenneth [name], male   DOB: November 30, 1945, 72 y.o.   MRN: [id number]  PROGRESS NOTE    Kenneth [name]  SLH:[id number] DOB: 12-May-1946 DOA: 06/09/2018 PCP: [name], MD   Brief Narrative:  72 year old male with history of CAD, DVT, rheumatoid arthritis, hyperlipidemia, tobacco abuse, COPD presents to the ER after being instructed by his PCP due to pancytopenia.  Allergy/oncology was consulted.  Assessment & Plan:   Active Problems:   Protein-calorie malnutrition, severe   Non-small cell cancer of left lung (HCC)   Pancytopenia (HCC)   HLD (hyperlipidemia)   Rheumatoid arthritis (HCC)   Cellulitis of right leg   Malnutrition of moderate degree   Pancytopenia -Likely due to folic acid and vitamin B12 deficiency versus methotrexate toxicity versus leukemia -Folate level is low at 4.5, vitamin B12 level 357 -Heme-onc on board: Continue vitamin L57 and folic acid supplementation.  Status post IR guided bone marrow biopsy on 06/13/2018.  Methotrexate on hold although methotrexate level is very low which indicates less likely methotrexate toxicity.  Transfuse packed red cells if hemoglobin less than 7 and platelets if less than 10,000 -No signs of bleeding.  Eliquis on hold -DIC panel negative.  Peripheral smear did not show cystoscopy sites. -Monitor daily CBC  Macrocytic anemia -Probably due to vitamin B12 and folate deficiency.  Continue supplementation  Cellulitis of right leg -Improved.  Treated with IV Rocephin for 5 days.  Blood cultures negative so far  COPD -Stable.  Continue inhalers, Symbicort, Spiriva  History of DVT -Hold Eliquis for now  Non-small cell cancer of left lung -Status post radiation therapy concluded in January 2019.  Outpatient follow-up with oncology  Rheumatoid arthritis -Methotrexate on hold.  Continue with prednisone.  Outpatient follow-up with rheumatology  Tobacco abuse -Continue nicotine patch.  Patient was advised to quit   DVT  prophylaxis: SCDs Code Status: DNR Family Communication: None at bedside Disposition Plan: Home once cleared by oncology  Consultants: Heme-onc/IR  Procedures: IR bone marrow biopsy on 06/13/2018  Antimicrobials: Rocephin from 06/09/2018-06/12/2018 Valtrex for prophylaxis  Subjective: Patient seen and examined at bedside.  He denies any overnight fever, nausea, vomiting.  Feels slightly better.  No bleeding  Objective: Vitals:   06/13/18 1941 06/13/18 2113 06/14/18 0455 06/14/18 0749  BP:  112/68 138/62   Pulse:  69 78   Resp:  16 17   Temp:  98 F (36.7 C) 98 F (36.7 C)   TempSrc:  Oral Oral   SpO2: 98% 100% 100% 96%  Weight:      Height:        Intake/Output Summary (Last 24 hours) at 06/14/2018 1209 Last data filed at 06/13/2018 2334 Gross per 24 hour  Intake 450 ml  Output 2 ml  Net 448 ml   Filed Weights   06/09/18 1648  Weight: 67.4 kg    Examination:  General exam: Appears calm and comfortable.  Looks older than stated age Respiratory system: Bilateral decreased breath sounds at bases Cardiovascular system: S1 & S2 heard, Rate controlled Gastrointestinal system: Abdomen is nondistended, soft and nontender. Normal bowel sounds heard. Extremities: No cyanosis, clubbing, edema.  Mild right lower extremity erythema around the same    Data Reviewed: I have personally reviewed following labs and imaging studies  CBC: Recent Labs  Lab 06/09/18 1618 06/10/18 0406  06/11/18 0554 06/12/18 0501 06/13/18 0020 06/13/18 0329 06/14/18 0338  WBC 1.6* 2.0*  --  2.3* 2.5*  --  2.2* 2.1*  NEUTROABS  0.8*  --   --  1.1* 1.1*  --  0.7* 0.4*  HGB 9.6* 7.8*   < > 8.4* 7.6* 8.5* 8.5* 8.7*  HCT 30.3* 23.7*   < > 26.0* 24.1* 25.7* 26.0* 26.6*  MCV 107.8* 107.7*  --  107.4* 106.2*  --  101.6* 102.7*  PLT 10* 28*  --  19* 21*  --  16* 30*   < > = values in this interval not displayed.   Basic Metabolic Panel: Recent Labs  Lab 06/09/18 1617 06/09/18 1618  06/10/18 0406 06/11/18 0554  NA  --  140 140 138  K  --  4.2 4.1 4.6  CL  --  104 106 108  CO2  --  _0 GLUCOSE  --  120* 93 108*  BUN  --  _1 CREATININE  --  1.25* 1.11 0.96  CALCIUM  --  8.9 8.4* 8.6*  MG 2.1  --  2.1  --   PHOS 2.5  --  3.7  --    GFR: Estimated Creatinine Clearance: 56.8 mL/min (by C-G formula based on SCr of 0.96 mg/dL). Liver Function Tests: Recent Labs  Lab 06/09/18 1617 06/10/18 0406  AST 26 20  ALT 19 18  ALKPHOS 79 65  BILITOT 0.6 0.5  PROT 7.3 6.0*  ALBUMIN 3.3* 2.7*   No results for input(s): LIPASE, AMYLASE in the last 168 hours. No results for input(s): AMMONIA in the last 168 hours. Coagulation Profile: Recent Labs  Lab 06/09/18 1618  INR 1.13   Cardiac Enzymes: No results for input(s): CKTOTAL, CKMB, CKMBINDEX, TROPONINI in the last 168 hours. BNP (last 3 results) No results for input(s): PROBNP in the last 8760 hours. HbA1C: No results for input(s): HGBA1C in the last 72 hours. CBG: No results for input(s): GLUCAP in the last 168 hours. Lipid Profile: No results for input(s): CHOL, HDL, LDLCALC, TRIG, CHOLHDL, LDLDIRECT in the last 72 hours. Thyroid Function Tests: No results for input(s): TSH, T4TOTAL, FREET4, T3FREE, THYROIDAB in the last 72 hours. Anemia Panel: No results for input(s): VITAMINB12, FOLATE, FERRITIN, TIBC, IRON, RETICCTPCT in the last 72 hours. Sepsis Labs: Recent Labs  Lab 06/09/18 2146  LATICACIDVEN 1.4    Recent Results (from the past 240 hour(s))  Culture, blood (Routine X 2) w Reflex to ID Panel     Status: None   Collection Time: 06/09/18  9:15 PM  Result Value Ref Range Status   Specimen Description   Final    BLOOD BLOOD LEFT ARM Performed at [facility] [address], [place], [place] [address]    Special Requests   Final    BOTTLES DRAWN AEROBIC AND ANAEROBIC Blood Culture adequate volume Performed at [facility] [address]  [place], [place] [address]    Culture   Final    NO GROWTH 5 DAYS Performed at [facility], [place] [address], [place], [place] [address]    Report Status 06/14/2018 FINAL  Final  Culture, blood (Routine X 2) w Reflex to ID Panel     Status: None   Collection Time: 06/09/18  9:16 PM  Result Value Ref Range Status   Specimen Description   Final    BLOOD BLOOD LEFT ARM Performed at [facility] [address], [place], [place] [address]    Special Requests   Final    BOTTLES DRAWN AEROBIC AND ANAEROBIC Blood Culture adequate volume Performed at  [facility], [place] [address] [place], [place] [address]    Culture   Final    NO GROWTH 5 DAYS Performed at [facility], [place] [address] [place], [place] [address]    Report Status 06/14/2018 FINAL  Final  MRSA PCR Screening     Status: None   Collection Time: 06/10/18  2:50 AM  Result Value Ref Range Status   MRSA by PCR NEGATIVE NEGATIVE Final    Comment:        The GeneXpert MRSA Assay (FDA approved for NASAL specimens only), is one component of a comprehensive MRSA colonization surveillance program. It is not intended to diagnose MRSA infection nor to guide or monitor treatment for MRSA infections. Performed at [facility], [place] [address] [place], [place] [address]          Radiology Studies: Ct Biopsy  Result Date: 06-26-2018 CLINICAL DATA:  Pancytopenia EXAM: CT GUIDED DEEP ILIAC BONE ASPIRATION AND CORE BIOPSY TECHNIQUE: Patient was placed supine on the CT gantry and limited axial scans through the pelvis were obtained. Appropriate skin entry site was identified. Skin site was marked, prepped with chlorhexidine, draped in usual sterile fashion, and infiltrated locally with 1% lidocaine. Intravenous Fentanyl and Versed were administered as conscious sedation during continuous monitoring of the patient's level of consciousness and physiological /  cardiorespiratory status by the radiology RN, with a total moderate sedation time of 10 minutes. Under CT fluoroscopic guidance an 11-gauge Cook trocar bone needle was advanced into the right iliac bone just lateral to the sacroiliac joint. Once needle tip position was confirmed, core and aspiration samples were obtained, submitted to pathology for approval. Post procedure scans show no hematoma or fracture. Patient tolerated procedure well. COMPLICATIONS: COMPLICATIONS none IMPRESSION: 1. Technically successful CT guided right iliac bone core and aspiration biopsy. Electronically Signed   By: Lucrezia Europe M.D.   On: 26-Jun-2018 12:32   Ct Bone Marrow Biopsy & Aspiration  Result Date: 06-26-18 CLINICAL DATA:  Pancytopenia EXAM: CT GUIDED DEEP ILIAC BONE ASPIRATION AND CORE BIOPSY TECHNIQUE: Patient was placed supine on the CT gantry and limited axial scans through the pelvis were obtained. Appropriate skin entry site was identified. Skin site was marked, prepped with chlorhexidine, draped in usual sterile fashion, and infiltrated locally with 1% lidocaine. Intravenous Fentanyl and Versed were administered as conscious sedation during continuous monitoring of the patient's level of consciousness and physiological / cardiorespiratory status by the radiology RN, with a total moderate sedation time of 10 minutes. Under CT fluoroscopic guidance an 11-gauge Cook trocar bone needle was advanced into the right iliac bone just lateral to the sacroiliac joint. Once needle tip position was confirmed, core and aspiration samples were obtained, submitted to pathology for approval. Post procedure scans show no hematoma or fracture. Patient tolerated procedure well. COMPLICATIONS: COMPLICATIONS none IMPRESSION: 1. Technically successful CT guided right iliac bone core and aspiration biopsy. Electronically Signed   By: Lucrezia Europe M.D.   On: 26-Jun-2018 12:32        Scheduled Meds: . sodium chloride   Intravenous Once  .  amLODipine  5 mg Oral Daily  . feeding supplement (ENSURE ENLIVE)  237 mL Oral BID BM  . folic acid  5 mg Oral Daily  . mometasone-formoterol  2 puff Inhalation BID  . predniSONE  2.5 mg Oral Daily  . rosuvastatin  10 mg Oral QPM  . senna  1 tablet Oral BID  . tiotropium  1 capsule Inhalation Daily  . valACYclovir  500 mg Oral BID  . vitamin B-12  1,000 mcg Oral Daily   Continuous Infusions: . sodium chloride Stopped (06/12/18 2255)     LOS: 5 days        Aline August, MD [facility] Pager [phone]  If 7PM-7AM, please contact night-coverage [email or web address] Password [facility] 06/14/2018, 12:09 PM

## 2018-06-15 ENCOUNTER — Other Ambulatory Visit: Payer: Self-pay | Admitting: Oncology

## 2018-06-15 DIAGNOSIS — E538 Deficiency of other specified B group vitamins: Secondary | ICD-10-CM

## 2018-06-15 DIAGNOSIS — D696 Thrombocytopenia, unspecified: Secondary | ICD-10-CM

## 2018-06-15 LAB — CBC WITH DIFFERENTIAL/PLATELET
BASOS PCT: 0 %
Basophils Absolute: 0 10*3/uL (ref 0.0–0.1)
EOS ABS: 0.8 10*3/uL — AB (ref 0.0–0.7)
EOS PCT: 34 %
HCT: 28.5 % — ABNORMAL LOW (ref 39.0–52.0)
Hemoglobin: 9.1 g/dL — ABNORMAL LOW (ref 13.0–17.0)
LYMPHS PCT: 41 %
Lymphs Abs: 0.9 10*3/uL (ref 0.7–4.0)
MCH: 33 pg (ref 26.0–34.0)
MCHC: 31.9 g/dL (ref 30.0–36.0)
MCV: 103.3 fL — AB (ref 78.0–100.0)
Monocytes Absolute: 0.3 10*3/uL (ref 0.1–1.0)
Monocytes Relative: 13 %
Neutro Abs: 0.3 10*3/uL — ABNORMAL LOW (ref 1.7–7.7)
Neutrophils Relative %: 12 %
PLATELETS: 72 10*3/uL — AB (ref 150–400)
RBC: 2.76 MIL/uL — AB (ref 4.22–5.81)
RDW: 16.5 % — AB (ref 11.5–15.5)
WBC: 2.3 10*3/uL — AB (ref 4.0–10.5)

## 2018-06-15 LAB — BASIC METABOLIC PANEL
ANION GAP: 5 (ref 5–15)
BUN: 16 mg/dL (ref 8–23)
CALCIUM: 8.4 mg/dL — AB (ref 8.9–10.3)
CO2: 28 mmol/L (ref 22–32)
Chloride: 106 mmol/L (ref 98–111)
Creatinine, Ser: 0.86 mg/dL (ref 0.61–1.24)
GFR calc Af Amer: >60 mL/min (ref >60)
GLUCOSE: 104 mg/dL — AB (ref 70–99)
Potassium: 4 mmol/L (ref 3.5–5.1)
SODIUM: 139 mmol/L (ref 135–145)

## 2018-06-15 LAB — MAGNESIUM: MAGNESIUM: 2 mg/dL (ref 1.7–2.4)

## 2018-06-15 MED ORDER — CYANOCOBALAMIN 1000 MCG PO TABS: 1000.0000 ug | ORAL_TABLET | Freq: Every day | ORAL | 0 refills | Status: AC

## 2018-06-15 MED ORDER — FOLIC ACID 1 MG PO TABS: 5.0000 mg | ORAL_TABLET | Freq: Every day | ORAL | 0 refills | Status: AC

## 2018-06-15 NOTE — Progress Notes (Signed)
The results of the bone marrow biopsy are still pending.  His counts continue to show recovery over the last 48 hours.  His platelet count is up to 72,000 without transfusion and hemoglobin is up to 9.1.  His methotrexate level was very low.  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  We will await the reading final results of the bone marrow biopsy before making final recommendations.  

## 2018-06-15 NOTE — Progress Notes (Signed)
Physical Therapy Treatment Patient Details Name: Kenneth [name] MRN: [id number] DOB: 1946-05-02 Today's Date: 06/15/2018    History of Present Illness Pt was admitted with pantocyopenia from MD office.  He has had increased fatigue and DOE.  PMH:  NSCLC, CAD, RA, and COPD.    PT Comments    Pt with generalized weakness seen in pt's difficulty with ambulating and increased time to perform mobility tasks, as well as cardiovascular deconditioning seen in O2sat 92-97% with ambulation and dyspnea. Pt also with some unsteadiness of gait, asking PT to hold onto his gait belt with ambulation. Pt's plan is to d/c home today, and prior to this visit, pt's plan was home without PT follow up. Pt would benefit from continued PT services because pt is going home to wife, who is undergoing chemotherapy and requires assist for ADLs at this time. Pt notes feeling weak and and deconditioned, and states he knows he would benefit from continued PT. Recommending HHPT to return pt to PLOF.   O2sats with ambulation on room air: 92-97% HR with ambulation: 106-137 bpm    Follow Up Recommendations  Supervision for mobility/OOB;Home health PT     Equipment Recommendations  None recommended by PT    Recommendations for Other Services       Precautions / Restrictions Precautions Precautions: Fall Restrictions Weight Bearing Restrictions: No    Mobility  Bed Mobility Overal bed mobility: Modified Independent             General bed mobility comments: HOB raised, uses rail for rolling, increased time to perform   Transfers Overall transfer level: Needs assistance Equipment used: None Transfers: Sit to/from Stand Sit to Stand: Min guard         General transfer comment: min guard for safety, increased time and effort   Ambulation/Gait Ambulation/Gait assistance: Min guard Gait Distance (Feet): 200 Feet Assistive device: None Gait Pattern/deviations: Step-through pattern;Decreased stride  length;Shuffle;Drifts right/left Gait velocity: decreased   General Gait Details: Min guard for safety, pt reported feeling "drunk". Required 2 seated rest breaks and one standing rest break with UE support due to dyspnea. Pt with O2sats ranging from 92-97 on room air, with pulse ranging from 106-137. Pt with irregularly irregular HR upon palpation on 2 occasions.    Stairs             Wheelchair Mobility    Modified Rankin (Stroke Patients Only)       Balance Overall balance assessment: Mild deficits observed, not formally tested                                          Cognition Arousal/Alertness: Awake/alert Behavior During Therapy: WFL for tasks assessed/performed Overall Cognitive Status: Within Functional Limits for tasks assessed                                        Exercises      General Comments        Pertinent Vitals/Pain Pain Assessment: Faces Faces Pain Scale: Hurts even more Pain Location: Low and middle back  Pain Descriptors / Indicators: Sore;Aching(weak) Pain Intervention(s): Limited activity within patient's tolerance;Monitored during session    Home Living  Prior Function            PT Goals (current goals can now be found in the care plan section) Acute Rehab PT Goals PT Goal Formulation: With patient Time For Goal Achievement: 06/25/18 Potential to Achieve Goals: Good Progress towards PT goals: Progressing toward goals    Frequency    Min 3X/week      PT Plan Discharge plan needs to be updated    Co-evaluation              AM-PAC PT "6 Clicks" Daily Activity  Outcome Measure  Difficulty turning over in bed (including adjusting bedclothes, sheets and blankets)?: A Little Difficulty moving from lying on back to sitting on the side of the bed? : A Little Difficulty sitting down on and standing up from a chair with arms (e.g., wheelchair, bedside  commode, etc,.)?: Unable Help needed moving to and from a bed to chair (including a wheelchair)?: A Little Help needed walking in hospital room?: A Little Help needed climbing 3-5 steps with a railing? : A Little 6 Click Score: 16    End of Session Equipment Utilized During Treatment: Gait belt Activity Tolerance: Patient limited by fatigue Patient left: with call bell/phone within reach;with family/visitor present;in bed;with bed alarm set Nurse Communication: Mobility status(spoke with the nurse tech, will relay information to RN) PT Visit Diagnosis: Difficulty in walking, not elsewhere classified (R26.2);Unsteadiness on feet (R26.81)     Time: 1445-1510 PT Time Calculation (min) (ACUTE ONLY): 25 min  Charges:  $Gait Training: 23-37 mins                    Julien Girt, PT, DPT  Pager # [phone]   Chirag Krueger D Cj Beecher 06/15/2018, 3:22 PM

## 2018-06-15 NOTE — Discharge Summary (Signed)
Physician Discharge Summary  Kenneth [name] LEX:[id number] DOB: March 22, 1946 DOA: 06/09/2018  PCP: [name], MD  Admit date: 06/09/2018 Discharge date: 06/15/2018  Admitted From: Home Disposition:  Home  Recommendations for Outpatient Follow-up:  1. Follow up with PCP in 1 week with repeat CBC 2. Follow up with Oncology/Dr. Alen Blew at earliest [place]: Yes  Equipment/Devices: No  Discharge Condition: Stable  CODE STATUS: DNR Diet recommendation: Heart Healthy   Brief/Interim Summary: 72 year old male with history of CAD, DVT, rheumatoid arthritis, hyperlipidemia, tobacco abuse, COPD presents to the ER after being instructed by his PCP due to pancytopenia.    Hematology/oncology was consulted.  Patient had packed red cells and platelet transfusion.  He will underwent IR guided bone marrow biopsy, pathology is pending.  Platelets have improved.  He will be discharged with outpatient follow-up with hematology.  Discharge Diagnoses:  Active Problems:   Protein-calorie malnutrition, severe   Non-small cell cancer of left lung (HCC)   Pancytopenia (HCC)   HLD (hyperlipidemia)   Rheumatoid arthritis (HCC)   Cellulitis of right leg   Malnutrition of moderate degree  Pancytopenia -Likely due to folic acid and vitamin B12 deficiency versus methotrexate toxicity versus leukemia -Folate level is low at 4.5, vitamin B12 level 357 -Heme-onc on board: Continue vitamin S49 and folic acid supplementation.  Status post IR guided bone marrow biopsy on 06/13/2018.  Methotrexate on hold although methotrexate level is very low which indicates less likely methotrexate toxicity.    Bone marrow biopsy pathology is pending.  Spoke to Dr. Alen Blew who is okay for the patient to be discharged and he will arrange for outpatient follow-up.  Platelets have improved, today is 72 -No signs of bleeding.  Eliquis on hold -DIC panel negative.  Peripheral smear did not show cystoscopy sites. -Follow-up  CBC within a week and follow-up with PCP/oncology.  Methotrexate and Eliquis will remain on hold until evaluation by PCP and/or oncology  Macrocytic anemia -Probably due to vitamin B12 and folate deficiency.  Continue supplementation  Cellulitis of right leg -Improved.  Treated with IV Rocephin for 5 days.  Blood cultures negative so far  COPD -Stable.  Continue inhalers, Symbicort, Spiriva  History of DVT -Hold Eliquis for now  Non-small cell cancer of left lung -Status post radiation therapy concluded in January 2019.  Outpatient follow-up with oncology  Rheumatoid arthritis -Methotrexate on hold.  Continue with prednisone.  Outpatient follow-up with rheumatology  Tobacco abuse -Outpatient follow-up. Patient was advised to quit  Discharge Instructions  Discharge Instructions    Ambulatory referral to Oncology   Complete by:  As directed    Call MD for:  difficulty breathing, headache or visual disturbances   Complete by:  As directed    Call MD for:  extreme fatigue   Complete by:  As directed    Call MD for:  hives   Complete by:  As directed    Call MD for:  persistant dizziness or light-headedness   Complete by:  As directed    Call MD for:  persistant nausea and vomiting   Complete by:  As directed    Call MD for:  severe uncontrolled pain   Complete by:  As directed    Call MD for:  temperature >100.4   Complete by:  As directed    Diet - low sodium heart healthy   Complete by:  As directed    Increase activity slowly   Complete by:  As directed  Allergies as of 06/15/2018      Reactions   Other    "CILLIN" Family Has patient had a PCN reaction causing immediate rash, facial/tongue/throat swelling, SOB or lightheadedness with hypotension: no Has patient had a PCN reaction causing severe rash involving mucus membranes or skin necrosis: yes Has patient had a PCN reaction that required hospitalization: no Has patient had a PCN reaction occurring  within the last 10 years: no If all of the above answers are "NO", then may proceed with Cephalosporin use.   Aspirin Other (See Comments)   Gi upset   Naproxen Sodium Other (See Comments)   Whelps in large doses **ALEVE**      Medication List    STOP taking these medications   ELIQUIS 2.5 MG Tabs tablet Generic drug:  apixaban   levofloxacin 500 MG tablet Commonly known as:  LEVAQUIN   methotrexate 2.5 MG tablet Commonly known as:  RHEUMATREX   nystatin 100000 UNIT/ML suspension Commonly known as:  MYCOSTATIN   pantoprazole 40 MG tablet Commonly known as:  PROTONIX     TAKE these medications   acetaminophen 325 MG tablet Commonly known as:  TYLENOL Take 325 mg by mouth every 4 (four) hours as needed for mild pain.   amLODipine 5 MG tablet Commonly known as:  NORVASC Take 1 tablet daily by mouth.   budesonide-formoterol 80-4.5 MCG/ACT inhaler Commonly known as:  SYMBICORT Inhale 2 puffs into the lungs 2 (two) times daily.   cholecalciferol 1000 units tablet Commonly known as:  VITAMIN D Take 1,000 Units by mouth daily.   clonazePAM 0.5 MG tablet Commonly known as:  KLONOPIN Take 0.5 mg by mouth 2 (two) times daily as needed for anxiety.   cyanocobalamin 1000 MCG tablet Take 1 tablet (1,000 mcg total) by mouth daily. Start taking on:  06/16/2018   fluticasone 50 MCG/ACT nasal spray Commonly known as:  FLONASE Place 2 sprays into the nose daily.   folic acid 1 MG tablet Commonly known as:  FOLVITE Take 5 tablets (5 mg total) by mouth daily. Start taking on:  06/16/2018 What changed:  how much to take   HYDROcodone-acetaminophen 5-325 MG tablet Commonly known as:  NORCO/VICODIN Take 1 tablet every 6 (six) hours as needed by mouth for moderate pain.   predniSONE 1 MG tablet Commonly known as:  DELTASONE Take 2.5 mg by mouth daily.   RECLAST 5 MG/100ML Soln injection Generic drug:  zoledronic acid Inject 5 mg into the vein once. Every 2 years    rosuvastatin 20 MG tablet Commonly known as:  CRESTOR Take 10 mg every evening by mouth.   SPIRIVA RESPIMAT 1.25 MCG/ACT Aers Generic drug:  Tiotropium Bromide Monohydrate Inhale 2 puffs into the lungs daily.   TUSSIN DM PO Take 1 tablet by mouth every 6 (six) hours as needed (cough and cold).   valACYclovir 500 MG tablet Commonly known as:  VALTREX Take 500 mg 2 (two) times daily by mouth.      Follow-up Information    [name], MD. Schedule an appointment as soon as possible for a visit in 1 week(s).   Specialty:  Internal Medicine Why:  with repeat cbc Contact information: [place] [address] [phone]        Wyatt Portela, MD Follow up.   Specialty:  Oncology Why:  at earliest convenience Contact information: [place] [address] [phone]          Allergies  Allergen Reactions  .  Other     "CILLIN" Family  Has patient had a PCN reaction causing immediate rash, facial/tongue/throat swelling, SOB or lightheadedness with hypotension: no Has patient had a PCN reaction causing severe rash involving mucus membranes or skin necrosis: yes Has patient had a PCN reaction that required hospitalization: no Has patient had a PCN reaction occurring within the last 10 years: no If all of the above answers are "NO", then may proceed with Cephalosporin use.   . Aspirin Other (See Comments)    Gi upset  . Naproxen Sodium Other (See Comments)    Whelps in large doses **ALEVE**    Consultations:  Oncology/IR   Procedures/Studies: Dg Tibia/fibula Right  Result Date: 06/09/2018 CLINICAL DATA:  72 y/o  M; anterior leg pain. EXAM: RIGHT TIBIA AND FIBULA - 2 VIEW COMPARISON:  None. FINDINGS: There is no evidence of fracture or other focal bone lesions. Vascular calcifications noted. IMPRESSION: Negative. Electronically Signed   By: Kristine Garbe M.D.   On: 06/09/2018 21:41   Ct Biopsy  Result Date:  06/13/2018 CLINICAL DATA:  Pancytopenia EXAM: CT GUIDED DEEP ILIAC BONE ASPIRATION AND CORE BIOPSY TECHNIQUE: Patient was placed supine on the CT gantry and limited axial scans through the pelvis were obtained. Appropriate skin entry site was identified. Skin site was marked, prepped with chlorhexidine, draped in usual sterile fashion, and infiltrated locally with 1% lidocaine. Intravenous Fentanyl and Versed were administered as conscious sedation during continuous monitoring of the patient's level of consciousness and physiological / cardiorespiratory status by the radiology RN, with a total moderate sedation time of 10 minutes. Under CT fluoroscopic guidance an 11-gauge Cook trocar bone needle was advanced into the right iliac bone just lateral to the sacroiliac joint. Once needle tip position was confirmed, core and aspiration samples were obtained, submitted to pathology for approval. Post procedure scans show no hematoma or fracture. Patient tolerated procedure well. COMPLICATIONS: COMPLICATIONS none IMPRESSION: 1. Technically successful CT guided right iliac bone core and aspiration biopsy. Electronically Signed   By: Lucrezia Europe M.D.   On: 06/13/2018 12:32   Ct Bone Marrow Biopsy & Aspiration  Result Date: 06/13/2018 CLINICAL DATA:  Pancytopenia EXAM: CT GUIDED DEEP ILIAC BONE ASPIRATION AND CORE BIOPSY TECHNIQUE: Patient was placed supine on the CT gantry and limited axial scans through the pelvis were obtained. Appropriate skin entry site was identified. Skin site was marked, prepped with chlorhexidine, draped in usual sterile fashion, and infiltrated locally with 1% lidocaine. Intravenous Fentanyl and Versed were administered as conscious sedation during continuous monitoring of the patient's level of consciousness and physiological / cardiorespiratory status by the radiology RN, with a total moderate sedation time of 10 minutes. Under CT fluoroscopic guidance an 11-gauge Cook trocar bone needle was  advanced into the right iliac bone just lateral to the sacroiliac joint. Once needle tip position was confirmed, core and aspiration samples were obtained, submitted to pathology for approval. Post procedure scans show no hematoma or fracture. Patient tolerated procedure well. COMPLICATIONS: COMPLICATIONS none IMPRESSION: 1. Technically successful CT guided right iliac bone core and aspiration biopsy. Electronically Signed   By: Lucrezia Europe M.D.   On: 06/13/2018 12:32     Subjective: Patient seen and examined at bedside.  He feels weak but slight better than yesterday.  No overnight fever, nausea, vomiting or any bleeding.  Discharge Exam: Vitals:   06/15/18 0416 06/15/18 0858  BP: 118/64   Pulse: (!) 102   Resp: 20   Temp: 97.8 F (36.6  C)   SpO2: 98% 97%   Vitals:   06/14/18 1914 06/14/18 1956 06/15/18 0416 06/15/18 0858  BP:  122/90 118/64   Pulse:  94 (!) 102   Resp:  18 20   Temp:  98 F (36.7 C) 97.8 F (36.6 C)   TempSrc:  Oral Oral   SpO2: 95% 99% 98% 97%  Weight:      Height:        General: Pt is alert, awake, not in acute distress Cardiovascular: rate controlled, S1/S2 + Respiratory: bilateral decreased breath sounds at bases Abdominal: Soft, NT, ND, bowel sounds + Extremities: no edema, no cyanosis    The results of significant diagnostics from this hospitalization (including imaging, microbiology, ancillary and laboratory) are listed below for reference.     Microbiology: Recent Results (from the past 240 hour(s))  Culture, blood (Routine X 2) w Reflex to ID Panel     Status: None   Collection Time: 06/09/18  9:15 PM  Result Value Ref Range Status   Specimen Description   Final    BLOOD BLOOD LEFT ARM Performed at [facility] [address], [place], [place] [address]    Special Requests   Final    BOTTLES DRAWN AEROBIC AND ANAEROBIC Blood Culture adequate volume Performed at [facility] [address]  [place], [place] [address]    Culture   Final    NO GROWTH 5 DAYS Performed at [facility], [place] [address] [place], [place] [address]    Report Status 06/14/2018 FINAL  Final  Culture, blood (Routine X 2) w Reflex to ID Panel     Status: None   Collection Time: 06/09/18  9:16 PM  Result Value Ref Range Status   Specimen Description   Final    BLOOD BLOOD LEFT ARM Performed at [facility] [address], [place], [place] [address]    Special Requests   Final    BOTTLES DRAWN AEROBIC AND ANAEROBIC Blood Culture adequate volume Performed at [facility] [address] [place], [place] [address]    Culture   Final    NO GROWTH 5 DAYS Performed at [facility], [place] [address] [place], [place] [address]    Report Status 06/14/2018 FINAL  Final  MRSA PCR Screening     Status: None   Collection Time: 06/10/18  2:50 AM  Result Value Ref Range Status   MRSA by PCR NEGATIVE NEGATIVE Final    Comment:        The GeneXpert MRSA Assay (FDA approved for NASAL specimens only), is one component of a comprehensive MRSA colonization surveillance program. It is not intended to diagnose MRSA infection nor to guide or monitor treatment for MRSA infections. Performed at [facility], [place] [address] [place], [place] [address]      Labs: BNP (last 3 results) No results for input(s): BNP in the last 8760 hours. Basic Metabolic Panel: Recent Labs  Lab 06/09/18 1617 06/09/18 1618 06/10/18 0406 06/11/18 0554 06/15/18 0344  NA  --  140 140 138 139  K  --  4.2 4.1 4.6 4.0  CL  --  104 106 108 106  CO2  --  '27 26 24 28  ' GLUCOSE  --  120* 93 108* 104*  BUN  --  '23 22 17 16  ' CREATININE  --  1.25* 1.11 0.96 0.86  CALCIUM  --  8.9 8.4* 8.6*  8.4*  MG 2.1  --  2.1  --  2.0  PHOS 2.5  --  3.7  --   --    Liver Function Tests: Recent Labs  Lab 06/09/18 1617 06/10/18 0406  AST 26 20  ALT 19 18  ALKPHOS 79 65  BILITOT  0.6 0.5  PROT 7.3 6.0*  ALBUMIN 3.3* 2.7*   No results for input(s): LIPASE, AMYLASE in the last 168 hours. No results for input(s): AMMONIA in the last 168 hours. CBC: Recent Labs  Lab 06/11/18 0554 06/12/18 0501 06/13/18 0020 06/13/18 0329 06/14/18 0338 06/15/18 0344  WBC 2.3* 2.5*  --  2.2* 2.1* 2.3*  NEUTROABS 1.1* 1.1*  --  0.7* 0.4* 0.3*  HGB 8.4* 7.6* 8.5* 8.5* 8.7* 9.1*  HCT 26.0* 24.1* 25.7* 26.0* 26.6* 28.5*  MCV 107.4* 106.2*  --  101.6* 102.7* 103.3*  PLT 19* 21*  --  16* 30* 72*   Cardiac Enzymes: No results for input(s): CKTOTAL, CKMB, CKMBINDEX, TROPONINI in the last 168 hours. BNP: Invalid input(s): POCBNP CBG: No results for input(s): GLUCAP in the last 168 hours. D-Dimer No results for input(s): DDIMER in the last 72 hours. Hgb A1c No results for input(s): HGBA1C in the last 72 hours. Lipid Profile No results for input(s): CHOL, HDL, LDLCALC, TRIG, CHOLHDL, LDLDIRECT in the last 72 hours. Thyroid function studies No results for input(s): TSH, T4TOTAL, T3FREE, THYROIDAB in the last 72 hours.  Invalid input(s): FREET3 Anemia work up No results for input(s): VITAMINB12, FOLATE, FERRITIN, TIBC, IRON, RETICCTPCT in the last 72 hours. Urinalysis    Component Value Date/Time   COLORURINE AMBER (A) 06/10/2018 0250   APPEARANCEUR CLEAR 06/10/2018 0250   LABSPEC 1.024 06/10/2018 0250   PHURINE 5.0 06/10/2018 0250   GLUCOSEU NEGATIVE 06/10/2018 0250   HGBUR SMALL (A) 06/10/2018 0250   BILIRUBINUR NEGATIVE 06/10/2018 0250   KETONESUR NEGATIVE 06/10/2018 0250   PROTEINUR NEGATIVE 06/10/2018 0250   NITRITE NEGATIVE 06/10/2018 0250   LEUKOCYTESUR NEGATIVE 06/10/2018 0250   Sepsis Labs Invalid input(s): PROCALCITONIN,  WBC,  LACTICIDVEN Microbiology Recent Results (from the past 240 hour(s))  Culture, blood (Routine X 2) w Reflex to ID Panel     Status: None   Collection Time: 06/09/18  9:15 PM  Result Value Ref Range Status   Specimen Description    Final    BLOOD BLOOD LEFT ARM Performed at [facility], [place] [address] [place], [place] [address]    Special Requests   Final    BOTTLES DRAWN AEROBIC AND ANAEROBIC Blood Culture adequate volume Performed at [facility] [address], [place], [place] [address]    Culture   Final    NO GROWTH 5 DAYS Performed at [facility], [place] [address] [place], [place] [address]    Report Status 06/14/2018 FINAL  Final  Culture, blood (Routine X 2) w Reflex to ID Panel     Status: None   Collection Time: 06/09/18  9:16 PM  Result Value Ref Range Status   Specimen Description   Final    BLOOD BLOOD LEFT ARM Performed at [facility] [address], [place], [place] [address]    Special Requests   Final    BOTTLES DRAWN AEROBIC AND ANAEROBIC Blood Culture adequate volume Performed at [facility] [address] [place],  [address]    Culture   Final    NO GROWTH 5 DAYS Performed at [facility], [place] [address], [place], [place] [address]    Report Status 06/14/2018 FINAL  Final  MRSA PCR Screening     Status: None   Collection Time: 06/10/18  2:50 AM  Result Value Ref Range Status   MRSA by PCR NEGATIVE NEGATIVE Final    Comment:        The GeneXpert MRSA Assay (FDA approved for NASAL specimens only), is one component of a comprehensive MRSA colonization surveillance program. It is not intended to diagnose MRSA infection nor to guide or monitor treatment for MRSA infections. Performed at [facility], [place] [address] [place], [place] [address]      Time coordinating discharge: 35 minutes  SIGNED:   Aline August, MD  [facility] 06/15/2018, 12:08 PM Pager: [phone]  If 7PM-7AM, please contact night-coverage [email or web address] Password [email or web address]

## 2018-06-15 NOTE — Accreditation Note (Signed)
Explaind discharge to patient and son at the bedside. Handed discharge papers to patient.

## 2018-06-15 NOTE — Care Management (Signed)
This CM spoke with pt at bedside about home health services. Pt declines home health services at this time. He states he has a RW, 3in1 and shower chair at home currently and doesn't need any additional DME. Marney Doctor RN,BSN [phone]

## 2018-06-15 NOTE — Care Management Important Message (Signed)
Important Message  Patient Details  Name: FRANCOIS ELK MRN: [id number] Date of Birth: Feb 03, 1946   Medicare Important Message Given:  Yes    Kerin Salen 06/15/2018, 10:22 AMImportant Message  Patient Details  Name: KINSEY COWSERT MRN: [id number] Date of Birth: [birth year]   Medicare Important Message Given:  Yes    Kerin Salen 06/15/2018, 10:22 AM

## 2018-06-15 NOTE — Progress Notes (Signed)
The results of his bone marrow biopsy were reviewed and discussed with the patient today.  No acute leukemic process detected.  His pancytopenia is likely related to an autoimmune phenomenon versus folate deficiency.  Myelodysplasia could also be a possibility as well.  I have no objections to discharge at this time and will arrange follow-up in the next few weeks to follow his counts.

## 2018-06-16 ENCOUNTER — Telehealth: Payer: Self-pay | Admitting: Oncology

## 2018-06-16 DIAGNOSIS — D61818 Other pancytopenia: Secondary | ICD-10-CM | POA: Diagnosis not present

## 2018-06-16 DIAGNOSIS — M6281 Muscle weakness (generalized): Secondary | ICD-10-CM | POA: Diagnosis not present

## 2018-06-16 DIAGNOSIS — M519 Unspecified thoracic, thoracolumbar and lumbosacral intervertebral disc disorder: Secondary | ICD-10-CM | POA: Diagnosis not present

## 2018-06-16 DIAGNOSIS — Z86718 Personal history of other venous thrombosis and embolism: Secondary | ICD-10-CM | POA: Diagnosis not present

## 2018-06-16 DIAGNOSIS — G43109 Migraine with aura, not intractable, without status migrainosus: Secondary | ICD-10-CM | POA: Diagnosis not present

## 2018-06-16 DIAGNOSIS — F418 Other specified anxiety disorders: Secondary | ICD-10-CM | POA: Diagnosis not present

## 2018-06-16 DIAGNOSIS — C3432 Malignant neoplasm of lower lobe, left bronchus or lung: Secondary | ICD-10-CM | POA: Diagnosis not present

## 2018-06-16 DIAGNOSIS — M7989 Other specified soft tissue disorders: Secondary | ICD-10-CM | POA: Diagnosis not present

## 2018-06-16 DIAGNOSIS — M810 Age-related osteoporosis without current pathological fracture: Secondary | ICD-10-CM | POA: Diagnosis not present

## 2018-06-16 DIAGNOSIS — I10 Essential (primary) hypertension: Secondary | ICD-10-CM | POA: Diagnosis not present

## 2018-06-16 DIAGNOSIS — J449 Chronic obstructive pulmonary disease, unspecified: Secondary | ICD-10-CM | POA: Diagnosis not present

## 2018-06-16 DIAGNOSIS — R627 Adult failure to thrive: Secondary | ICD-10-CM | POA: Diagnosis not present

## 2018-06-16 DIAGNOSIS — F17210 Nicotine dependence, cigarettes, uncomplicated: Secondary | ICD-10-CM | POA: Diagnosis not present

## 2018-06-16 DIAGNOSIS — M069 Rheumatoid arthritis, unspecified: Secondary | ICD-10-CM | POA: Diagnosis not present

## 2018-06-16 DIAGNOSIS — H9190 Unspecified hearing loss, unspecified ear: Secondary | ICD-10-CM | POA: Diagnosis not present

## 2018-06-16 DIAGNOSIS — Z79899 Other long term (current) drug therapy: Secondary | ICD-10-CM | POA: Diagnosis not present

## 2018-06-16 DIAGNOSIS — M109 Gout, unspecified: Secondary | ICD-10-CM | POA: Diagnosis not present

## 2018-06-16 DIAGNOSIS — Z7952 Long term (current) use of systemic steroids: Secondary | ICD-10-CM | POA: Diagnosis not present

## 2018-06-16 NOTE — Telephone Encounter (Signed)
Scheduled appt per 8/29 sch message - wife aware - reminder letter sent in the mail with appt date and time.

## 2018-06-19 DIAGNOSIS — M519 Unspecified thoracic, thoracolumbar and lumbosacral intervertebral disc disorder: Secondary | ICD-10-CM | POA: Diagnosis not present

## 2018-06-19 DIAGNOSIS — M6281 Muscle weakness (generalized): Secondary | ICD-10-CM | POA: Diagnosis not present

## 2018-06-19 DIAGNOSIS — J449 Chronic obstructive pulmonary disease, unspecified: Secondary | ICD-10-CM | POA: Diagnosis not present

## 2018-06-19 DIAGNOSIS — C3432 Malignant neoplasm of lower lobe, left bronchus or lung: Secondary | ICD-10-CM | POA: Diagnosis not present

## 2018-06-19 DIAGNOSIS — M069 Rheumatoid arthritis, unspecified: Secondary | ICD-10-CM | POA: Diagnosis not present

## 2018-06-19 DIAGNOSIS — R627 Adult failure to thrive: Secondary | ICD-10-CM | POA: Diagnosis not present

## 2018-06-20 ENCOUNTER — Encounter (HOSPITAL_COMMUNITY): Payer: Self-pay | Admitting: Oncology

## 2018-06-20 DIAGNOSIS — M069 Rheumatoid arthritis, unspecified: Secondary | ICD-10-CM | POA: Diagnosis not present

## 2018-06-20 DIAGNOSIS — C3432 Malignant neoplasm of lower lobe, left bronchus or lung: Secondary | ICD-10-CM | POA: Diagnosis not present

## 2018-06-20 DIAGNOSIS — M6281 Muscle weakness (generalized): Secondary | ICD-10-CM | POA: Diagnosis not present

## 2018-06-20 DIAGNOSIS — J449 Chronic obstructive pulmonary disease, unspecified: Secondary | ICD-10-CM | POA: Diagnosis not present

## 2018-06-20 DIAGNOSIS — M519 Unspecified thoracic, thoracolumbar and lumbosacral intervertebral disc disorder: Secondary | ICD-10-CM | POA: Diagnosis not present

## 2018-06-20 DIAGNOSIS — R627 Adult failure to thrive: Secondary | ICD-10-CM | POA: Diagnosis not present

## 2018-06-21 DIAGNOSIS — C3432 Malignant neoplasm of lower lobe, left bronchus or lung: Secondary | ICD-10-CM | POA: Diagnosis not present

## 2018-06-21 DIAGNOSIS — J449 Chronic obstructive pulmonary disease, unspecified: Secondary | ICD-10-CM | POA: Diagnosis not present

## 2018-06-21 DIAGNOSIS — M519 Unspecified thoracic, thoracolumbar and lumbosacral intervertebral disc disorder: Secondary | ICD-10-CM | POA: Diagnosis not present

## 2018-06-21 DIAGNOSIS — M6281 Muscle weakness (generalized): Secondary | ICD-10-CM | POA: Diagnosis not present

## 2018-06-21 DIAGNOSIS — M069 Rheumatoid arthritis, unspecified: Secondary | ICD-10-CM | POA: Diagnosis not present

## 2018-06-21 DIAGNOSIS — R627 Adult failure to thrive: Secondary | ICD-10-CM | POA: Diagnosis not present

## 2018-06-22 DIAGNOSIS — M069 Rheumatoid arthritis, unspecified: Secondary | ICD-10-CM | POA: Diagnosis not present

## 2018-06-22 DIAGNOSIS — E538 Deficiency of other specified B group vitamins: Secondary | ICD-10-CM | POA: Diagnosis not present

## 2018-06-22 DIAGNOSIS — D61818 Other pancytopenia: Secondary | ICD-10-CM | POA: Diagnosis not present

## 2018-06-22 DIAGNOSIS — J449 Chronic obstructive pulmonary disease, unspecified: Secondary | ICD-10-CM | POA: Diagnosis not present

## 2018-06-22 DIAGNOSIS — I8290 Acute embolism and thrombosis of unspecified vein: Secondary | ICD-10-CM | POA: Diagnosis not present

## 2018-06-22 DIAGNOSIS — C3432 Malignant neoplasm of lower lobe, left bronchus or lung: Secondary | ICD-10-CM | POA: Diagnosis not present

## 2018-06-22 DIAGNOSIS — Z681 Body mass index (BMI) 19 or less, adult: Secondary | ICD-10-CM | POA: Diagnosis not present

## 2018-06-22 DIAGNOSIS — Z23 Encounter for immunization: Secondary | ICD-10-CM | POA: Diagnosis not present

## 2018-06-23 DIAGNOSIS — J449 Chronic obstructive pulmonary disease, unspecified: Secondary | ICD-10-CM | POA: Diagnosis not present

## 2018-06-23 DIAGNOSIS — M519 Unspecified thoracic, thoracolumbar and lumbosacral intervertebral disc disorder: Secondary | ICD-10-CM | POA: Diagnosis not present

## 2018-06-23 DIAGNOSIS — C3432 Malignant neoplasm of lower lobe, left bronchus or lung: Secondary | ICD-10-CM | POA: Diagnosis not present

## 2018-06-23 DIAGNOSIS — R627 Adult failure to thrive: Secondary | ICD-10-CM | POA: Diagnosis not present

## 2018-06-23 DIAGNOSIS — M069 Rheumatoid arthritis, unspecified: Secondary | ICD-10-CM | POA: Diagnosis not present

## 2018-06-23 DIAGNOSIS — M6281 Muscle weakness (generalized): Secondary | ICD-10-CM | POA: Diagnosis not present

## 2018-06-26 ENCOUNTER — Encounter (HOSPITAL_COMMUNITY): Payer: Self-pay | Admitting: Oncology

## 2018-06-26 DIAGNOSIS — M069 Rheumatoid arthritis, unspecified: Secondary | ICD-10-CM | POA: Diagnosis not present

## 2018-06-26 DIAGNOSIS — R627 Adult failure to thrive: Secondary | ICD-10-CM | POA: Diagnosis not present

## 2018-06-26 DIAGNOSIS — C3432 Malignant neoplasm of lower lobe, left bronchus or lung: Secondary | ICD-10-CM | POA: Diagnosis not present

## 2018-06-26 DIAGNOSIS — M519 Unspecified thoracic, thoracolumbar and lumbosacral intervertebral disc disorder: Secondary | ICD-10-CM | POA: Diagnosis not present

## 2018-06-26 DIAGNOSIS — J449 Chronic obstructive pulmonary disease, unspecified: Secondary | ICD-10-CM | POA: Diagnosis not present

## 2018-06-26 DIAGNOSIS — M6281 Muscle weakness (generalized): Secondary | ICD-10-CM | POA: Diagnosis not present

## 2018-06-27 DIAGNOSIS — M519 Unspecified thoracic, thoracolumbar and lumbosacral intervertebral disc disorder: Secondary | ICD-10-CM | POA: Diagnosis not present

## 2018-06-27 DIAGNOSIS — R627 Adult failure to thrive: Secondary | ICD-10-CM | POA: Diagnosis not present

## 2018-06-27 DIAGNOSIS — M6281 Muscle weakness (generalized): Secondary | ICD-10-CM | POA: Diagnosis not present

## 2018-06-27 DIAGNOSIS — J449 Chronic obstructive pulmonary disease, unspecified: Secondary | ICD-10-CM | POA: Diagnosis not present

## 2018-06-27 DIAGNOSIS — M069 Rheumatoid arthritis, unspecified: Secondary | ICD-10-CM | POA: Diagnosis not present

## 2018-06-27 DIAGNOSIS — C3432 Malignant neoplasm of lower lobe, left bronchus or lung: Secondary | ICD-10-CM | POA: Diagnosis not present

## 2018-06-28 DIAGNOSIS — M6281 Muscle weakness (generalized): Secondary | ICD-10-CM | POA: Diagnosis not present

## 2018-06-28 DIAGNOSIS — C3432 Malignant neoplasm of lower lobe, left bronchus or lung: Secondary | ICD-10-CM | POA: Diagnosis not present

## 2018-06-28 DIAGNOSIS — M069 Rheumatoid arthritis, unspecified: Secondary | ICD-10-CM | POA: Diagnosis not present

## 2018-06-28 DIAGNOSIS — J449 Chronic obstructive pulmonary disease, unspecified: Secondary | ICD-10-CM | POA: Diagnosis not present

## 2018-06-28 DIAGNOSIS — R627 Adult failure to thrive: Secondary | ICD-10-CM | POA: Diagnosis not present

## 2018-06-28 DIAGNOSIS — M519 Unspecified thoracic, thoracolumbar and lumbosacral intervertebral disc disorder: Secondary | ICD-10-CM | POA: Diagnosis not present

## 2018-06-30 ENCOUNTER — Inpatient Hospital Stay: Payer: Medicare Other

## 2018-06-30 ENCOUNTER — Inpatient Hospital Stay: Payer: Medicare Other | Attending: Oncology | Admitting: Oncology

## 2018-06-30 VITALS — BP 151/69 | HR 77 | Temp 97.7°F | Resp 17 | Ht 63.0 in | Wt 140.4 lb

## 2018-06-30 DIAGNOSIS — E538 Deficiency of other specified B group vitamins: Secondary | ICD-10-CM | POA: Insufficient documentation

## 2018-06-30 DIAGNOSIS — M129 Arthropathy, unspecified: Secondary | ICD-10-CM | POA: Insufficient documentation

## 2018-06-30 DIAGNOSIS — Z79899 Other long term (current) drug therapy: Secondary | ICD-10-CM | POA: Diagnosis not present

## 2018-06-30 DIAGNOSIS — Z86718 Personal history of other venous thrombosis and embolism: Secondary | ICD-10-CM | POA: Insufficient documentation

## 2018-06-30 DIAGNOSIS — D61818 Other pancytopenia: Secondary | ICD-10-CM | POA: Diagnosis not present

## 2018-06-30 LAB — CMP (CANCER CENTER ONLY)
ALT: 11 U/L (ref 0–44)
ANION GAP: 8 (ref 5–15)
AST: 15 U/L (ref 15–41)
Albumin: 3.1 g/dL — ABNORMAL LOW (ref 3.5–5.0)
Alkaline Phosphatase: 87 U/L (ref 38–126)
BUN: 13 mg/dL (ref 8–23)
CHLORIDE: 106 mmol/L (ref 98–111)
CO2: 29 mmol/L (ref 22–32)
Calcium: 9.8 mg/dL (ref 8.9–10.3)
Creatinine: 1.13 mg/dL (ref 0.61–1.24)
GFR, Estimated: >60 mL/min (ref >60)
Glucose, Bld: 132 mg/dL — ABNORMAL HIGH (ref 70–99)
POTASSIUM: 3.4 mmol/L — AB (ref 3.5–5.1)
SODIUM: 143 mmol/L (ref 135–145)
Total Bilirubin: 0.3 mg/dL (ref 0.3–1.2)
Total Protein: 7 g/dL (ref 6.5–8.1)

## 2018-06-30 LAB — CBC WITH DIFFERENTIAL (CANCER CENTER ONLY)
BASOS ABS: 0.2 10*3/uL — AB (ref 0.0–0.1)
Basophils Relative: 3 %
EOS ABS: 0 10*3/uL (ref 0.0–0.5)
Eosinophils Relative: 1 %
HEMATOCRIT: 37.1 % — AB (ref 38.4–49.9)
Hemoglobin: 12 g/dL — ABNORMAL LOW (ref 13.0–17.1)
LYMPHS PCT: 20 %
Lymphs Abs: 1.3 10*3/uL (ref 0.9–3.3)
MCH: 34.1 pg — ABNORMAL HIGH (ref 27.2–33.4)
MCHC: 32.3 g/dL (ref 32.0–36.0)
MCV: 105.6 fL — AB (ref 79.3–98.0)
Monocytes Absolute: 0.7 10*3/uL (ref 0.1–0.9)
Monocytes Relative: 11 %
NEUTROS PCT: 65 %
Neutro Abs: 4.1 10*3/uL (ref 1.5–6.5)
PLATELETS: 213 10*3/uL (ref 140–400)
RBC: 3.51 MIL/uL — AB (ref 4.20–5.82)
RDW: 20.7 % — ABNORMAL HIGH (ref 11.0–14.6)
WBC Count: 6.2 10*3/uL (ref 4.0–10.3)

## 2018-06-30 LAB — FOLATE

## 2018-06-30 NOTE — Progress Notes (Signed)
Hematology and Oncology Follow Up Visit  Kenneth [name] [id number] Jul 10, 1946 72 y.o. 06/30/2018 10:21 AM [name], MDPerini, Elta Guadeloupe, MD   Principle Diagnosis: 72 year old diagnosed with pancytopenia in August 2019.  Kenneth [name] presented with white cell count of 1.6, platelet count of 10,000 and hemoglobin of 9.6 with macrocytosis.  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   Prior Therapy:  Bone marrow biopsy obtained on 06/13/2018 showed some mild dyspoietic changes.  Cytogenetics are normal.  Current therapy:  Folic acid replacement.  Interim History: Kenneth [name] presents today for a follow-up visit.  Kenneth [name] is a pleasant gentleman I saw in consultation during hospitalization in August 2019.  Kenneth [name] presented with pancytopenia that has spontaneously recovered and did receive platelet transfusion however.  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  Since his discharge, Kenneth [name] has been feeling better and improving slowly.  His appetite is also improved and gained some weight.  Kenneth [name] is continue to take folic acid replacement at 1 mg daily and Kenneth [name] has not resumed methotrexate.  Kenneth [name] denies any easy bruising or active bleeding.  His performance status is improving.  Kenneth [name] does not report any headaches, blurry vision, syncope or seizures. Does not report any fevers, chills or sweats.  Does not report any cough, wheezing or hemoptysis.  Does not report any chest pain, palpitation, orthopnea or leg edema.  Does not report any nausea, vomiting or abdominal pain.  Does not report any constipation or diarrhea.  Does not report any skeletal complaints.    Does not report frequency, urgency or hematuria.  Does not report any skin rashes or lesions. Does not report any heat or cold intolerance.  Does not report any lymphadenopathy or petechiae.  Does not report any anxiety or depression.  Remaining review of systems is negative.    Medications: I have reviewed the patient's current  medications.  Current Outpatient Medications  Medication Sig Dispense Refill  . acetaminophen (TYLENOL) 325 MG tablet Take 325 mg by mouth every 4 (four) hours as needed for mild pain.     Marland Kitchen amLODipine (NORVASC) 5 MG tablet Take 1 tablet daily by mouth.  3  . budesonide-formoterol (SYMBICORT) 80-4.5 MCG/ACT inhaler Inhale 2 puffs into the lungs 2 (two) times daily.    . cholecalciferol (VITAMIN D) 1000 UNITS tablet Take 1,000 Units by mouth daily.      . clonazePAM (KLONOPIN) 0.5 MG tablet Take 0.5 mg by mouth 2 (two) times daily as needed for anxiety.     . Dextromethorphan-Guaifenesin (TUSSIN DM PO) Take 1 tablet by mouth every 6 (six) hours as needed (cough and cold).     . fluticasone (FLONASE) 50 MCG/ACT nasal spray Place 2 sprays into the nose daily.      . folic acid (FOLVITE) 1 MG tablet Take 5 tablets (5 mg total) by mouth daily. 30 tablet 0  . HYDROcodone-acetaminophen (NORCO/VICODIN) 5-325 MG tablet Take 1 tablet every 6 (six) hours as needed by mouth for moderate pain.    . predniSONE (DELTASONE) 1 MG tablet Take 2.5 mg by mouth daily.  2  . rosuvastatin (CRESTOR) 20 MG tablet Take 10 mg every evening by mouth.     . Tiotropium Bromide Monohydrate (SPIRIVA RESPIMAT) 1.25 MCG/ACT AERS Inhale 2 puffs into the lungs daily.    . valACYclovir (VALTREX) 500 MG tablet Take 500 mg 2 (two) times daily by mouth.    . vitamin B-12 1000 MCG tablet Take  1 tablet (1,000 mcg total) by mouth daily. 30 tablet 0  . zoledronic acid (RECLAST) 5 MG/100ML SOLN Inject 5 mg into the vein once. Every 2 years      No current facility-administered medications for this visit.      Allergies:  Allergies  Allergen Reactions  . Other     "CILLIN" Family  Has patient had a PCN reaction causing immediate rash, facial/tongue/throat swelling, SOB or lightheadedness with hypotension: no Has patient had a PCN reaction causing severe rash involving mucus membranes or skin necrosis: yes Has patient had a PCN  reaction that required hospitalization: no Has patient had a PCN reaction occurring within the last 10 years: no If all of the above answers are "NO", then may proceed with Cephalosporin use.   . Aspirin Other (See Comments)    Gi upset  . Naproxen Sodium Other (See Comments)    Whelps in large doses **ALEVE**    Past Medical History, Surgical history, Social history, and Family History were reviewed and updated.    Physical Exam: Blood pressure (!) 151/69, pulse 77, temperature 97.7 F (36.5 C), temperature source Oral, resp. rate 17, height _0  (1.6 m), weight 140 lb 6.4 oz (63.7 kg), SpO2 96 %.   ECOG: 1 General appearance: alert and cooperative appeared without distress. Head: Normocephalic, without obvious abnormality Oropharynx: No oral thrush or ulcers. Eyes: No scleral icterus.  Pupils are equal and round reactive to light. Lymph nodes: Cervical, supraclavicular, and axillary nodes normal. Heart:regular rate and rhythm, S1, S2 normal, no murmur, click, rub or gallop Lung:chest clear, no wheezing, rales, normal symmetric air entry Abdomin: soft, non-tender, without masses or organomegaly. Neurological: No motor, sensory deficits.  Intact deep tendon reflexes. Skin: No rashes or lesions.  No ecchymosis or petechiae. Musculoskeletal: Noted joint deformity in both hands related to rheumatoid arthritis. Psychiatric: Mood and affect are appropriate.    Lab Results: Lab Results  Component Value Date   WBC 2.3 (L) 06/15/2018   HGB 9.1 (L) 06/15/2018   HCT 28.5 (L) 06/15/2018   MCV 103.3 (H) 06/15/2018   PLT 72 (L) 06/15/2018     Chemistry      Component Value Date/Time   NA 139 06/15/2018 0344   NA 141 08/08/2017 1247   K 4.0 06/15/2018 0344   K 5.0 08/08/2017 1247   CL 106 06/15/2018 0344   CO2 28 06/15/2018 0344   CO2 25 08/08/2017 1247   BUN 16 06/15/2018 0344   BUN 14.7 08/08/2017 1247   CREATININE 0.86 06/15/2018 0344   CREATININE 1.19 03/02/2018 1206    CREATININE 1.2 08/08/2017 1247      Component Value Date/Time   CALCIUM 8.4 (L) 06/15/2018 0344   CALCIUM 9.5 08/08/2017 1247   ALKPHOS 65 06/10/2018 0406   ALKPHOS 84 08/08/2017 1247   AST 20 06/10/2018 0406   AST 21 08/08/2017 1247   ALT 18 06/10/2018 0406   ALT 17 08/08/2017 1247   BILITOT 0.5 06/10/2018 0406   BILITOT 0.69 08/08/2017 1247         Impression and Plan:  72 year old man with the following:  1.  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  His counts nearly recovered on his discharge in June 15, 2018 with platelet count up to 72,000, hemoglobin up to 9 with white cell count of 2.3.  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  No evidence of blasts or acute leukemia.  His CBC from today was personally reviewed and showed counts are back to normal at this time.  I doubt there is a hematological disorder at this time and likely all this findings are related to folic acid deficiency.  I recommended continuing this indefinitely.    2.  Thrombocytopenia: Kenneth [name] received a 1 unit of packed red cell transfusion during his hospitalization in August 2019.  No further transfusion was given.  His platelet count has normalized at this time.  3.  History of DVT: No active bleeding noted after resumption of his anticoagulation.  4.  Rheumatoid arthritis: Kenneth [name] has discontinued methotrexate at this time.  I have no objections of restarting it at a lower dose and close monitoring in the future.  5.  Follow-up: Will be as needed in the future.  I am happy to readdress this issue with him in the future if it arises.  Kenneth [name] has routine laboratory testing done by his primary care physician.  15  minutes was spent with the patient face-to-face today.  More than 50% of time was dedicated to reviewing the natural course of his  disease, laboratory data review and managing his plan of care.        Zola Button, MD 9/13/201910:21 AM

## 2018-06-30 NOTE — Addendum Note (Signed)
Addended by: Randolm Idol on: 06/30/2018 10:42 AM   Modules accepted: Orders

## 2018-07-01 DIAGNOSIS — M069 Rheumatoid arthritis, unspecified: Secondary | ICD-10-CM | POA: Diagnosis not present

## 2018-07-01 DIAGNOSIS — C3432 Malignant neoplasm of lower lobe, left bronchus or lung: Secondary | ICD-10-CM | POA: Diagnosis not present

## 2018-07-01 DIAGNOSIS — M519 Unspecified thoracic, thoracolumbar and lumbosacral intervertebral disc disorder: Secondary | ICD-10-CM | POA: Diagnosis not present

## 2018-07-01 DIAGNOSIS — J449 Chronic obstructive pulmonary disease, unspecified: Secondary | ICD-10-CM | POA: Diagnosis not present

## 2018-07-01 DIAGNOSIS — R627 Adult failure to thrive: Secondary | ICD-10-CM | POA: Diagnosis not present

## 2018-07-01 DIAGNOSIS — M6281 Muscle weakness (generalized): Secondary | ICD-10-CM | POA: Diagnosis not present

## 2018-07-03 ENCOUNTER — Telehealth: Payer: Self-pay

## 2018-07-03 DIAGNOSIS — J449 Chronic obstructive pulmonary disease, unspecified: Secondary | ICD-10-CM | POA: Diagnosis not present

## 2018-07-03 DIAGNOSIS — C3432 Malignant neoplasm of lower lobe, left bronchus or lung: Secondary | ICD-10-CM | POA: Diagnosis not present

## 2018-07-03 DIAGNOSIS — M519 Unspecified thoracic, thoracolumbar and lumbosacral intervertebral disc disorder: Secondary | ICD-10-CM | POA: Diagnosis not present

## 2018-07-03 DIAGNOSIS — M069 Rheumatoid arthritis, unspecified: Secondary | ICD-10-CM | POA: Diagnosis not present

## 2018-07-03 DIAGNOSIS — R627 Adult failure to thrive: Secondary | ICD-10-CM | POA: Diagnosis not present

## 2018-07-03 DIAGNOSIS — M6281 Muscle weakness (generalized): Secondary | ICD-10-CM | POA: Diagnosis not present

## 2018-07-03 NOTE — Telephone Encounter (Signed)
Per 9/13 no los

## 2018-07-05 DIAGNOSIS — M069 Rheumatoid arthritis, unspecified: Secondary | ICD-10-CM | POA: Diagnosis not present

## 2018-07-05 DIAGNOSIS — R627 Adult failure to thrive: Secondary | ICD-10-CM | POA: Diagnosis not present

## 2018-07-05 DIAGNOSIS — J449 Chronic obstructive pulmonary disease, unspecified: Secondary | ICD-10-CM | POA: Diagnosis not present

## 2018-07-05 DIAGNOSIS — M519 Unspecified thoracic, thoracolumbar and lumbosacral intervertebral disc disorder: Secondary | ICD-10-CM | POA: Diagnosis not present

## 2018-07-05 DIAGNOSIS — M6281 Muscle weakness (generalized): Secondary | ICD-10-CM | POA: Diagnosis not present

## 2018-07-05 DIAGNOSIS — C3432 Malignant neoplasm of lower lobe, left bronchus or lung: Secondary | ICD-10-CM | POA: Diagnosis not present

## 2018-07-10 DIAGNOSIS — M519 Unspecified thoracic, thoracolumbar and lumbosacral intervertebral disc disorder: Secondary | ICD-10-CM | POA: Diagnosis not present

## 2018-07-10 DIAGNOSIS — M069 Rheumatoid arthritis, unspecified: Secondary | ICD-10-CM | POA: Diagnosis not present

## 2018-07-10 DIAGNOSIS — M6281 Muscle weakness (generalized): Secondary | ICD-10-CM | POA: Diagnosis not present

## 2018-07-10 DIAGNOSIS — J449 Chronic obstructive pulmonary disease, unspecified: Secondary | ICD-10-CM | POA: Diagnosis not present

## 2018-07-10 DIAGNOSIS — C3432 Malignant neoplasm of lower lobe, left bronchus or lung: Secondary | ICD-10-CM | POA: Diagnosis not present

## 2018-07-10 DIAGNOSIS — R627 Adult failure to thrive: Secondary | ICD-10-CM | POA: Diagnosis not present

## 2018-07-12 DIAGNOSIS — M6281 Muscle weakness (generalized): Secondary | ICD-10-CM | POA: Diagnosis not present

## 2018-07-12 DIAGNOSIS — C3432 Malignant neoplasm of lower lobe, left bronchus or lung: Secondary | ICD-10-CM | POA: Diagnosis not present

## 2018-07-12 DIAGNOSIS — J449 Chronic obstructive pulmonary disease, unspecified: Secondary | ICD-10-CM | POA: Diagnosis not present

## 2018-07-12 DIAGNOSIS — R627 Adult failure to thrive: Secondary | ICD-10-CM | POA: Diagnosis not present

## 2018-07-12 DIAGNOSIS — M069 Rheumatoid arthritis, unspecified: Secondary | ICD-10-CM | POA: Diagnosis not present

## 2018-07-12 DIAGNOSIS — M519 Unspecified thoracic, thoracolumbar and lumbosacral intervertebral disc disorder: Secondary | ICD-10-CM | POA: Diagnosis not present

## 2018-07-18 DIAGNOSIS — R627 Adult failure to thrive: Secondary | ICD-10-CM | POA: Diagnosis not present

## 2018-07-18 DIAGNOSIS — M6281 Muscle weakness (generalized): Secondary | ICD-10-CM | POA: Diagnosis not present

## 2018-07-18 DIAGNOSIS — C3432 Malignant neoplasm of lower lobe, left bronchus or lung: Secondary | ICD-10-CM | POA: Diagnosis not present

## 2018-07-18 DIAGNOSIS — M519 Unspecified thoracic, thoracolumbar and lumbosacral intervertebral disc disorder: Secondary | ICD-10-CM | POA: Diagnosis not present

## 2018-07-18 DIAGNOSIS — J449 Chronic obstructive pulmonary disease, unspecified: Secondary | ICD-10-CM | POA: Diagnosis not present

## 2018-07-18 DIAGNOSIS — M069 Rheumatoid arthritis, unspecified: Secondary | ICD-10-CM | POA: Diagnosis not present

## 2018-08-01 DIAGNOSIS — M519 Unspecified thoracic, thoracolumbar and lumbosacral intervertebral disc disorder: Secondary | ICD-10-CM | POA: Diagnosis not present

## 2018-08-01 DIAGNOSIS — M069 Rheumatoid arthritis, unspecified: Secondary | ICD-10-CM | POA: Diagnosis not present

## 2018-08-01 DIAGNOSIS — C3432 Malignant neoplasm of lower lobe, left bronchus or lung: Secondary | ICD-10-CM | POA: Diagnosis not present

## 2018-08-01 DIAGNOSIS — R627 Adult failure to thrive: Secondary | ICD-10-CM | POA: Diagnosis not present

## 2018-08-01 DIAGNOSIS — J449 Chronic obstructive pulmonary disease, unspecified: Secondary | ICD-10-CM | POA: Diagnosis not present

## 2018-08-01 DIAGNOSIS — M6281 Muscle weakness (generalized): Secondary | ICD-10-CM | POA: Diagnosis not present

## 2018-08-16 DIAGNOSIS — M0579 Rheumatoid arthritis with rheumatoid factor of multiple sites without organ or systems involvement: Secondary | ICD-10-CM | POA: Diagnosis not present

## 2018-08-16 DIAGNOSIS — Z6821 Body mass index (BMI) 21.0-21.9, adult: Secondary | ICD-10-CM | POA: Diagnosis not present

## 2018-08-16 DIAGNOSIS — M2550 Pain in unspecified joint: Secondary | ICD-10-CM | POA: Diagnosis not present

## 2018-08-16 DIAGNOSIS — M150 Primary generalized (osteo)arthritis: Secondary | ICD-10-CM | POA: Diagnosis not present

## 2018-08-16 DIAGNOSIS — B019 Varicella without complication: Secondary | ICD-10-CM | POA: Diagnosis not present

## 2018-08-16 DIAGNOSIS — R5382 Chronic fatigue, unspecified: Secondary | ICD-10-CM | POA: Diagnosis not present

## 2018-08-16 DIAGNOSIS — M5030 Other cervical disc degeneration, unspecified cervical region: Secondary | ICD-10-CM | POA: Diagnosis not present

## 2018-09-07 ENCOUNTER — Other Ambulatory Visit: Payer: Self-pay

## 2018-09-07 ENCOUNTER — Encounter: Payer: Self-pay | Admitting: Radiation Oncology

## 2018-09-07 ENCOUNTER — Ambulatory Visit
Admission: RE | Admit: 2018-09-07 | Discharge: 2018-09-07 | Disposition: A | Discharge status: Home / Self Care | Payer: Medicare Other | Source: Ambulatory Visit | Attending: Radiation Oncology | Admitting: Radiation Oncology

## 2018-09-07 VITALS — BP 156/81 | HR 71 | Temp 97.8°F | Resp 18 | Wt 146.0 lb

## 2018-09-07 DIAGNOSIS — C3492 Malignant neoplasm of unspecified part of left bronchus or lung: Secondary | ICD-10-CM | POA: Insufficient documentation

## 2018-09-07 DIAGNOSIS — Z923 Personal history of irradiation: Secondary | ICD-10-CM | POA: Insufficient documentation

## 2018-09-07 DIAGNOSIS — Z08 Encounter for follow-up examination after completed treatment for malignant neoplasm: Secondary | ICD-10-CM | POA: Diagnosis not present

## 2018-09-07 DIAGNOSIS — Z85118 Personal history of other malignant neoplasm of bronchus and lung: Secondary | ICD-10-CM | POA: Diagnosis not present

## 2018-09-07 DIAGNOSIS — Z79899 Other long term (current) drug therapy: Secondary | ICD-10-CM | POA: Insufficient documentation

## 2018-09-07 NOTE — Progress Notes (Signed)
Mr. Sirmon is here for a follow-up appointment today.Patient reports that he is having pain in his rib area.Patient reports moderate fatigue.Patient denies any shortness of breath. Patient reports that he has difficulty with swallowing some time. Patient denies any coughing. Patient states that his appetite is ok. Patient denies any skin issues at his radiation site.  Vitals:   09/07/18 1129 09/07/18 1144  BP: (!) 156/81   Pulse: 71   Resp: 18   Temp:  97.8 F (36.6 C)  TempSrc: Oral Oral  SpO2: 98%   Weight: 146 lb (66.2 kg)    Wt Readings from Last 3 Encounters:  09/07/18 146 lb (66.2 kg)  06/30/18 140 lb 6.4 oz (63.7 kg)  06/09/18 148 lb 9.4 oz (67.4 kg)

## 2018-09-07 NOTE — Progress Notes (Signed)
Radiation Oncology         (336) [phone] ________________________________  Name: CECILIA [name] MRN: [id number]  Date: 09/07/2018  DOB: 1946-08-01  Follow-Up Visit Note  CC: Kenneth Infante, MD  Kenneth [name], *    ICD-10-CM   1. Non-small cell cancer of left lung [facility]) C34.92 CT Chest Wo Contrast    Diagnosis:   ClinicalStage IA non-small cell lung cancer (adenocarcinoma)  Interval Since Last Radiation:  10 months  Radiation treatment dates:   10/17/17 - 10/24/17 Site/dose:   Left Lung treated to 54 Gy in 3 fractions of 18 Gy  Narrative:  The patient returns today with his wife for routine follow-up.  After his last follow-up, he had a chest CT scan done on 03/09/18 which showed progressive changes of external beam radiation within the left lower lobe. The index nodule was no longer measurable. Other small pulmonary nodules were stable. No new or enlarging nodules identified.   He was diagnosed with pancytopenia and folic acid deficiency three months ago, which he says is related to taking methotrexate. Bone marrow biopsy obtained on 06/13/18 revealed some mild dyspoietic changes. He is currently receiving folic acid replacement therapy under the care of Dr. Alen [name].                         On review of systems, the patient reports intermittent pain in his chest/rib area. He reports moderate fatigue. He denies any shortness of breath. He reports that he has difficulty with swallowing sometimes which he attributes to anxiety and stomach/espophagus issues. He denies any coughing or hemoptysis. He states that his appetite is ok. He denies any skin issues at his radiation site.  ALLERGIES:  is allergic to other; aspirin; and naproxen sodium.  Meds: Current Outpatient Medications  Medication Sig Dispense Refill  . amLODipine (NORVASC) 5 MG tablet Take 1 tablet daily by mouth.  3  . cholecalciferol (VITAMIN D) 1000 UNITS tablet Take 1,000 Units by mouth daily.      . clonazePAM  (KLONOPIN) 0.5 MG tablet Take 0.5 mg by mouth 2 (two) times daily as needed for anxiety.     . Dextromethorphan-Guaifenesin (TUSSIN DM PO) Take 1 tablet by mouth every 6 (six) hours as needed (cough and cold).     . fluticasone (FLONASE) 50 MCG/ACT nasal spray Place 2 sprays into the nose daily.      . Fluticasone-Umeclidin-Vilant (TRELEGY ELLIPTA) 100-62.5-25 MCG/INH AEPB Inhale into the lungs.    . folic acid (FOLVITE) 1 MG tablet Take 5 tablets (5 mg total) by mouth daily. (Patient taking differently: Take 800 mcg by mouth daily. ) 30 tablet 0  . HYDROcodone-acetaminophen (NORCO/VICODIN) 5-325 MG tablet Take 1 tablet every 6 (six) hours as needed by mouth for moderate pain.    . predniSONE (DELTASONE) 1 MG tablet Take 2.5 mg by mouth daily.  2  . rosuvastatin (CRESTOR) 20 MG tablet Take 10 mg every evening by mouth.     . Sennosides 8.6 MG CAPS Take by mouth.    . valACYclovir (VALTREX) 500 MG tablet Take 500 mg 2 (two) times daily by mouth.    . vitamin B-12 1000 MCG tablet Take 1 tablet (1,000 mcg total) by mouth daily. 30 tablet 0  . zoledronic acid (RECLAST) 5 MG/100ML SOLN Inject 5 mg into the vein once. Every 2 years     . acetaminophen (TYLENOL) 325 MG tablet Take 325 mg by mouth every 4 (four) hours  as needed for mild pain.     . Tiotropium Bromide Monohydrate (SPIRIVA RESPIMAT) 1.25 MCG/ACT AERS Inhale 2 puffs into the lungs daily.     No current facility-administered medications for this encounter.     Physical Findings: The patient is in no acute distress. Patient is alert and oriented.  weight is 146 lb (66.2 kg). His oral temperature is 97.8 F (36.6 C). His blood pressure is 156/81 (abnormal) and his pulse is 71. His respiration is 18 and oxygen saturation is 98%.   Lungs have crackles in the bases. Heart has regular rate and rhythm. No palpable cervical, supraclavicular, or axillary adenopathy. Abdomen soft, non-tender, normal bowel sounds.  Lab Findings: Lab Results    Component Value Date   WBC 6.2 06/30/2018   HGB 12.0 (L) 06/30/2018   HCT 37.1 (L) 06/30/2018   MCV 105.6 (H) 06/30/2018   PLT 213 06/30/2018    Radiographic Findings: No results found.  Impression:  no evidence recurrence on clinical exam today.  Plan:  Follow up in radiation oncology in 6 months. Patient will be scheduled for CT scan of the chest in the next 2 weeks for follow-up of his stereotactic body radiation therapy for his clinical stage IA non-small cell lung cancer presenting in the left lower lung.  ____________________________________  Kenneth Promise, PhD, MD  This document serves as a record of services personally performed by Kenneth Pray, MD. It was created on his behalf by Kenneth [name], a trained medical scribe. The creation of this record is based on the scribe's personal observations and the provider's statements to them. This document has been checked and approved by the attending provider.

## 2018-09-21 ENCOUNTER — Telehealth: Payer: Self-pay | Admitting: *Deleted

## 2018-09-21 DIAGNOSIS — M0579 Rheumatoid arthritis with rheumatoid factor of multiple sites without organ or systems involvement: Secondary | ICD-10-CM | POA: Diagnosis not present

## 2018-09-21 NOTE — Telephone Encounter (Signed)
CALLED PATIENT TO INFORM OF CT FOR 09-27-18 - ARRIVAL TIME - 10:15 AM @ [facility], NO RESTRICTIONS TO TEST, AND PATIENT TO GET RESULTS WITH DR. KINARD ON 09-28-18 @ 4 PM, SPOKE WITH PATIENT'S WIFE - MARY AND SHE IS AWARE OF THESE APPTS.

## 2018-09-22 DIAGNOSIS — B027 Disseminated zoster: Secondary | ICD-10-CM | POA: Diagnosis not present

## 2018-09-22 DIAGNOSIS — D61818 Other pancytopenia: Secondary | ICD-10-CM | POA: Diagnosis not present

## 2018-09-22 DIAGNOSIS — E7849 Other hyperlipidemia: Secondary | ICD-10-CM | POA: Diagnosis not present

## 2018-09-22 DIAGNOSIS — Z6821 Body mass index (BMI) 21.0-21.9, adult: Secondary | ICD-10-CM | POA: Diagnosis not present

## 2018-09-22 DIAGNOSIS — M069 Rheumatoid arthritis, unspecified: Secondary | ICD-10-CM | POA: Diagnosis not present

## 2018-09-22 DIAGNOSIS — E538 Deficiency of other specified B group vitamins: Secondary | ICD-10-CM | POA: Diagnosis not present

## 2018-09-22 DIAGNOSIS — I10 Essential (primary) hypertension: Secondary | ICD-10-CM | POA: Diagnosis not present

## 2018-09-22 DIAGNOSIS — J449 Chronic obstructive pulmonary disease, unspecified: Secondary | ICD-10-CM | POA: Diagnosis not present

## 2018-09-22 DIAGNOSIS — Z125 Encounter for screening for malignant neoplasm of prostate: Secondary | ICD-10-CM | POA: Diagnosis not present

## 2018-09-22 DIAGNOSIS — M5489 Other dorsalgia: Secondary | ICD-10-CM | POA: Diagnosis not present

## 2018-09-27 ENCOUNTER — Ambulatory Visit (HOSPITAL_COMMUNITY)
Admission: RE | Admit: 2018-09-27 | Discharge: 2018-09-27 | Disposition: A | Discharge status: Home / Self Care | Payer: Medicare Other | Source: Ambulatory Visit | Attending: Radiation Oncology | Admitting: Radiation Oncology

## 2018-09-27 DIAGNOSIS — C3492 Malignant neoplasm of unspecified part of left bronchus or lung: Secondary | ICD-10-CM

## 2018-09-28 ENCOUNTER — Ambulatory Visit
Admission: RE | Admit: 2018-09-28 | Discharge: 2018-09-28 | Disposition: A | Discharge status: Home / Self Care | Payer: Medicare Other | Source: Ambulatory Visit | Attending: Radiation Oncology | Admitting: Radiation Oncology

## 2018-09-28 ENCOUNTER — Encounter: Payer: Self-pay | Admitting: Radiation Oncology

## 2018-09-28 ENCOUNTER — Other Ambulatory Visit: Payer: Self-pay

## 2018-09-28 VITALS — BP 150/79 | HR 66 | Temp 97.6°F | Resp 18 | Ht 69.0 in | Wt 146.4 lb

## 2018-09-28 DIAGNOSIS — C3492 Malignant neoplasm of unspecified part of left bronchus or lung: Secondary | ICD-10-CM | POA: Diagnosis not present

## 2018-09-28 DIAGNOSIS — Z79899 Other long term (current) drug therapy: Secondary | ICD-10-CM | POA: Insufficient documentation

## 2018-09-28 DIAGNOSIS — Z08 Encounter for follow-up examination after completed treatment for malignant neoplasm: Secondary | ICD-10-CM | POA: Diagnosis not present

## 2018-09-28 DIAGNOSIS — Z923 Personal history of irradiation: Secondary | ICD-10-CM | POA: Insufficient documentation

## 2018-09-28 DIAGNOSIS — Z85118 Personal history of other malignant neoplasm of bronchus and lung: Secondary | ICD-10-CM | POA: Diagnosis not present

## 2018-09-28 NOTE — Progress Notes (Signed)
Radiation Oncology         (336) [phone] ________________________________  Name: Kenneth [name] MRN: [id number]  Date: 09/28/2018  DOB: 1945-11-05  Follow-Up Visit Note  CC: [name], MD  Melrose Nakayama, *    ICD-10-CM   1. Non-small cell cancer of left lung [facility]) C34.92     Diagnosis:   ClinicalStage IA non-small cell lung cancer (adenocarcinoma)  Interval Since Last Radiation:  11 months and 1 week 10/17/17 - 10/24/17: Left Lung / 54 Gy in 3 fractions  Narrative:  The patient returns today to review recent chest CT scan. He is accompanied by family member. 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 Previously identified left upper lobe nodules are unchanged.   He reports mild to moderate fatigue, pain in back and hips that ranges from 3-6/10, occasional shortness of breath with exertion, and rare cough with sputum that "stops in throat." He denies difficulty breathing, hemoptysis, and difficulty swallowing with the exception of soda sometimes.   ALLERGIES:  is allergic to other; aspirin; and naproxen sodium.  Meds: Current Outpatient Medications  Medication Sig Dispense Refill  . acetaminophen (TYLENOL) 325 MG tablet Take 325 mg by mouth every 4 (four) hours as needed for mild pain.     Marland Kitchen amLODipine (NORVASC) 5 MG tablet Take 1 tablet daily by mouth.  3  . Certolizumab Pegol (CIMZIA) 2 X 200 MG KIT Inject 1 each into the skin every 28 (twenty-eight) days.    . cholecalciferol (VITAMIN D) 1000 UNITS tablet Take 1,000 Units by mouth daily.      . clonazePAM (KLONOPIN) 0.5 MG tablet Take 0.5 mg by mouth 2 (two) times daily as needed for anxiety.     . Dextromethorphan-Guaifenesin (TUSSIN DM PO) Take 1 tablet by mouth every 6 (six) hours as needed (cough and cold).     . fluticasone (FLONASE) 50 MCG/ACT nasal  spray Place 2 sprays into the nose daily.      . Fluticasone-Umeclidin-Vilant (TRELEGY ELLIPTA) 100-62.5-25 MCG/INH AEPB Inhale into the lungs.    . folic acid (FOLVITE) 1 MG tablet Take 5 tablets (5 mg total) by mouth daily. (Patient taking differently: Take 800 mcg by mouth daily. ) 30 tablet 0  . HYDROcodone-acetaminophen (NORCO/VICODIN) 5-325 MG tablet Take 1 tablet every 6 (six) hours as needed by mouth for moderate pain.    . predniSONE (DELTASONE) 1 MG tablet Take 2.5 mg by mouth daily.  2  . rosuvastatin (CRESTOR) 20 MG tablet Take 10 mg every evening by mouth.     . Sennosides 8.6 MG CAPS Take by mouth.    . valACYclovir (VALTREX) 500 MG tablet Take 500 mg 2 (two) times daily by mouth.    . vitamin B-12 1000 MCG tablet Take 1 tablet (1,000 mcg total) by mouth daily. 30 tablet 0  . zoledronic acid (RECLAST) 5 MG/100ML SOLN Inject 5 mg into the vein once. Every 2 years      No current facility-administered medications for this encounter.     Physical Findings: The patient is in no acute distress. Patient is alert and oriented.  height is '5\' 9"'  (1.753 m) and weight is 146 lb 6 oz (66.4 kg). His oral temperature is 97.6 F (36.4 C). His blood pressure is 150/79 (abnormal) and his pulse is 66. His respiration is 18 and oxygen saturation is 93%.  Lungs with mild crackles in both bases. Heart has regular rate and rhythm. No palpable cervical, supraclavicular, or axillary adenopathy. Abdomen soft, non-tender, normal bowel sounds.   Lab Findings: Lab Results  Component Value Date   WBC 6.2 06/30/2018   HGB 12.0 (L) 06/30/2018   HCT 37.1 (L) 06/30/2018   MCV 105.6 (H) 06/30/2018   PLT 213 06/30/2018    Radiographic Findings: Ct Chest Wo Contrast  Result Date: 09/27/2018 CLINICAL DATA:  Left lung cancer.  Status post XRT. EXAM: CT CHEST WITHOUT CONTRAST TECHNIQUE: Multidetector CT imaging of the chest was performed following the standard protocol without IV contrast. COMPARISON:   03/09/2018 FINDINGS: Cardiovascular: The heart size is normal. No substantial pericardial effusion. Coronary artery calcification is evident. Atherosclerotic calcification is noted in the wall of the thoracic aorta. Mediastinum/Nodes: No mediastinal lymphadenopathy. No evidence for gross hilar lymphadenopathy although assessment is limited by the lack of intravenous contrast on today's study. The esophagus has normal imaging features. There is no axillary lymphadenopathy. Lungs/Pleura: The central tracheobronchial airways are patent. Centrilobular and paraseptal emphysema again noted with symmetric biapical pleuroparenchymal scarring. 5 mm left upper lobe pulmonary nodule is stable at 5 mm (31/7). Irregular posterior left upper lobe nodule measured previously at 9 mm is stable at 9 mm today (36/7). The interstitial and airspace disease in the posterior left lower lobe is progressive and has become more confluent in the interval (99/7), likely reflecting evolving post radiation fibrosis. No pleural effusion. Upper Abdomen: Layering tiny gallstones evident. Musculoskeletal: No worrisome lytic or sclerotic osseous abnormality. IMPRESSION: 1. 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 Index nodule seen on prior exams has been incorporated into this process. 2. Previously identified left upper lobe nodules are unchanged. 3.  Emphysema. (ICD10-J43.9) 4.  Aortic Atherosclerois (ICD10-170.0) Electronically Signed   By: Misty Stanley M.D.   On: 09/27/2018 14:37    Impression:  ClinicalStage IA non-small cell lung cancer (adenocarcinoma) No evidence recurrence on recent chest CT scan and clinical exam today.   Plan:  Follow up in radiation oncology on 03/08/19. We will order chest CT scan to be performed prior to that appointment.  ____________________________________  Blair Promise, PhD, MD  This document serves as a record of services personally performed  by Gery Pray, MD. It was created on his behalf by Wilburn Mylar, a trained medical scribe. The creation of this record is based on the scribe's personal observations and the provider's statements to them. This document has been checked and approved by the attending provider.

## 2018-09-28 NOTE — Progress Notes (Signed)
Pt presents today for f/u with Dr. Sondra Come. Pt reports mild to moderate fatigue. Pt c/o pain in back and hips, rated a 3-6/10. Pt reports occasional SOB with exertion. Pt denies difficulty breathing. Pt reports rare cough, that sputum "stops in throat". Pt denies hemoptysis. Pt denies difficulty swallowing, with the exception of drinking soda after a period of not swallowing for some time.   BP (!) 150/79 (BP Location: Left Arm, Patient Position: Sitting)   Pulse 66   Temp 97.6 F (36.4 C) (Oral)   Resp 18   Ht 5\' 9"  (1.753 m)   Wt 146 lb 6 oz (66.4 kg)   SpO2 93%   BMI 21.62 kg/m   Wt Readings from Last 3 Encounters:  09/28/18 146 lb 6 oz (66.4 kg)  09/07/18 146 lb (66.2 kg)  06/30/18 140 lb 6.4 oz (63.7 kg)   Loma Sousa, RN BSN

## 2018-10-05 DIAGNOSIS — M0579 Rheumatoid arthritis with rheumatoid factor of multiple sites without organ or systems involvement: Secondary | ICD-10-CM | POA: Diagnosis not present

## 2018-10-19 DIAGNOSIS — M0579 Rheumatoid arthritis with rheumatoid factor of multiple sites without organ or systems involvement: Secondary | ICD-10-CM | POA: Diagnosis not present

## 2018-11-16 DIAGNOSIS — M150 Primary generalized (osteo)arthritis: Secondary | ICD-10-CM | POA: Diagnosis not present

## 2018-11-16 DIAGNOSIS — B019 Varicella without complication: Secondary | ICD-10-CM | POA: Diagnosis not present

## 2018-11-16 DIAGNOSIS — M5030 Other cervical disc degeneration, unspecified cervical region: Secondary | ICD-10-CM | POA: Diagnosis not present

## 2018-11-16 DIAGNOSIS — M2550 Pain in unspecified joint: Secondary | ICD-10-CM | POA: Diagnosis not present

## 2018-11-16 DIAGNOSIS — M0579 Rheumatoid arthritis with rheumatoid factor of multiple sites without organ or systems involvement: Secondary | ICD-10-CM | POA: Diagnosis not present

## 2018-11-16 DIAGNOSIS — R5382 Chronic fatigue, unspecified: Secondary | ICD-10-CM | POA: Diagnosis not present

## 2018-11-16 DIAGNOSIS — D61818 Other pancytopenia: Secondary | ICD-10-CM | POA: Diagnosis not present

## 2018-11-16 DIAGNOSIS — Z6823 Body mass index (BMI) 23.0-23.9, adult: Secondary | ICD-10-CM | POA: Diagnosis not present

## 2018-11-17 DIAGNOSIS — M0579 Rheumatoid arthritis with rheumatoid factor of multiple sites without organ or systems involvement: Secondary | ICD-10-CM | POA: Diagnosis not present

## 2018-12-15 DIAGNOSIS — M0579 Rheumatoid arthritis with rheumatoid factor of multiple sites without organ or systems involvement: Secondary | ICD-10-CM | POA: Diagnosis not present

## 2019-02-20 DIAGNOSIS — M5489 Other dorsalgia: Secondary | ICD-10-CM | POA: Diagnosis not present

## 2019-02-20 DIAGNOSIS — Z659 Problem related to unspecified psychosocial circumstances: Secondary | ICD-10-CM | POA: Diagnosis not present

## 2019-02-20 DIAGNOSIS — R627 Adult failure to thrive: Secondary | ICD-10-CM | POA: Diagnosis not present

## 2019-02-20 DIAGNOSIS — G479 Sleep disorder, unspecified: Secondary | ICD-10-CM | POA: Diagnosis not present

## 2019-02-20 DIAGNOSIS — R197 Diarrhea, unspecified: Secondary | ICD-10-CM | POA: Diagnosis not present

## 2019-02-20 DIAGNOSIS — M6281 Muscle weakness (generalized): Secondary | ICD-10-CM | POA: Diagnosis not present

## 2019-02-20 DIAGNOSIS — I10 Essential (primary) hypertension: Secondary | ICD-10-CM | POA: Diagnosis not present

## 2019-03-07 ENCOUNTER — Telehealth: Payer: Self-pay | Admitting: *Deleted

## 2019-03-07 NOTE — Telephone Encounter (Signed)
CALLED PATIENT TO INFORM OF CT FOR 03-14-19 - ARRIVAL TIME- 12:45 PM @ [facility], NO RESTRICTIONS TO TEST, PATIENT TO FOLLOW-UP WITH DR. KINARD FOR RESULTS ON 03-15-19, SPOKE WITH PATIENT'S WIFE- MARY AND SHE IS AWARE OF THESE APPTS.

## 2019-03-08 ENCOUNTER — Ambulatory Visit: Payer: Medicare Other | Admitting: Radiation Oncology

## 2019-03-14 ENCOUNTER — Ambulatory Visit (HOSPITAL_COMMUNITY)
Admission: RE | Admit: 2019-03-14 | Discharge: 2019-03-14 | Disposition: A | Discharge status: Home / Self Care | Payer: Medicare Other | Source: Ambulatory Visit | Attending: Radiation Oncology | Admitting: Radiation Oncology

## 2019-03-14 ENCOUNTER — Other Ambulatory Visit: Payer: Self-pay

## 2019-03-14 DIAGNOSIS — C3492 Malignant neoplasm of unspecified part of left bronchus or lung: Secondary | ICD-10-CM | POA: Diagnosis not present

## 2019-03-14 DIAGNOSIS — R0781 Pleurodynia: Secondary | ICD-10-CM | POA: Diagnosis not present

## 2019-03-14 DIAGNOSIS — S299XXA Unspecified injury of thorax, initial encounter: Secondary | ICD-10-CM | POA: Diagnosis not present

## 2019-03-15 ENCOUNTER — Encounter: Payer: Self-pay | Admitting: Radiation Oncology

## 2019-03-15 ENCOUNTER — Ambulatory Visit
Admission: RE | Admit: 2019-03-15 | Discharge: 2019-03-15 | Disposition: A | Discharge status: Home / Self Care | Payer: Medicare Other | Source: Ambulatory Visit | Attending: Radiation Oncology | Admitting: Radiation Oncology

## 2019-03-15 ENCOUNTER — Other Ambulatory Visit: Payer: Self-pay

## 2019-03-15 VITALS — BP 133/76 | HR 86 | Temp 98.3°F | Resp 18 | Ht 69.0 in | Wt 163.1 lb

## 2019-03-15 DIAGNOSIS — K8020 Calculus of gallbladder without cholecystitis without obstruction: Secondary | ICD-10-CM | POA: Insufficient documentation

## 2019-03-15 DIAGNOSIS — J90 Pleural effusion, not elsewhere classified: Secondary | ICD-10-CM | POA: Diagnosis not present

## 2019-03-15 DIAGNOSIS — Z85118 Personal history of other malignant neoplasm of bronchus and lung: Secondary | ICD-10-CM | POA: Insufficient documentation

## 2019-03-15 DIAGNOSIS — R5383 Other fatigue: Secondary | ICD-10-CM | POA: Insufficient documentation

## 2019-03-15 DIAGNOSIS — I700 Atherosclerosis of aorta: Secondary | ICD-10-CM | POA: Insufficient documentation

## 2019-03-15 DIAGNOSIS — J439 Emphysema, unspecified: Secondary | ICD-10-CM | POA: Insufficient documentation

## 2019-03-15 DIAGNOSIS — C3492 Malignant neoplasm of unspecified part of left bronchus or lung: Secondary | ICD-10-CM

## 2019-03-15 DIAGNOSIS — Z08 Encounter for follow-up examination after completed treatment for malignant neoplasm: Secondary | ICD-10-CM | POA: Diagnosis not present

## 2019-03-15 DIAGNOSIS — Z923 Personal history of irradiation: Secondary | ICD-10-CM | POA: Diagnosis not present

## 2019-03-15 DIAGNOSIS — Z79899 Other long term (current) drug therapy: Secondary | ICD-10-CM | POA: Insufficient documentation

## 2019-03-15 DIAGNOSIS — M069 Rheumatoid arthritis, unspecified: Secondary | ICD-10-CM | POA: Diagnosis not present

## 2019-03-15 NOTE — Addendum Note (Signed)
Encounter addended by: Gery Pray, MD on: 03/15/2019 8:06 PM  Actions taken: Order list changed, Diagnosis association updated, Visit Navigator Flowsheet section accepted

## 2019-03-15 NOTE — Progress Notes (Signed)
Patient in for follow up doing okay states he fell injuries his rib cage. CT scan complete. Complains of being tired still. BP 133/76 (BP Location: Left Arm, Patient Position: Sitting)   Pulse 86   Temp 98.3 F (36.8 C) (Temporal)   Resp 18   Ht 5\' 9"  (1.753 m)   Wt 163 lb 2 oz (74 kg)   SpO2 92%   BMI 24.09 kg/m  Wt Readings from Last 3 Encounters:  03/15/19 163 lb 2 oz (74 kg)  09/28/18 146 lb 6 oz (66.4 kg)  09/07/18 146 lb (66.2 kg)

## 2019-03-15 NOTE — Progress Notes (Signed)
Radiation Oncology         [phone] ________________________________  Name: Kenneth [name] MRN: [id number]  Date: 03/15/2019  DOB: 1946-03-11  Follow-Up Visit Note  CC: [name], MD  Melrose Nakayama, *    ICD-10-CM   1. Non-small cell cancer of left lung [facility]) C34.92     Diagnosis:   73 y.o. male with ClinicalStage IA non-small cell lung cancer (adenocarcinoma)  Interval Since Last Radiation:  1 year 4 months   Radiation treatment dates:   10/17/17 - 10/24/17  Site/dose:   Left Lung treated with SBRT to 54 Gy in 3 fractions of 18 Gy  Narrative:  The patient returns today for routine follow-up.  His CT scan of the chest from yesterday, 03/14/2019, shows very questionable nondisplaced right anterolateral eighth rib fracture. Trace right pleural effusion, new. Post treatment scarring in the left lower lobe.  On review of systems, the patient reports that he is doing okay overall. He states that he fell and has injuries to his rib cage. He complains of fatigue.  ALLERGIES:  is allergic to other; aspirin; and naproxen sodium.  Meds: Current Outpatient Medications  Medication Sig Dispense Refill  . acetaminophen (TYLENOL) 325 MG tablet Take 325 mg by mouth every 4 (four) hours as needed for mild pain.     Marland Kitchen amLODipine (NORVASC) 5 MG tablet Take 1 tablet daily by mouth.  3  . Certolizumab Pegol (CIMZIA) 2 X 200 MG KIT Inject 1 each into the skin every 28 (twenty-eight) days.    . cholecalciferol (VITAMIN D) 1000 UNITS tablet Take 1,000 Units by mouth daily.      . clonazePAM (KLONOPIN) 0.5 MG tablet Take 0.5 mg by mouth 2 (two) times daily as needed for anxiety.     . Dextromethorphan-Guaifenesin (TUSSIN DM PO) Take 1 tablet by mouth every 6 (six) hours as needed (cough and cold).     . fluticasone (FLONASE) 50 MCG/ACT nasal spray Place 2 sprays into the nose daily.      . Fluticasone-Umeclidin-Vilant (TRELEGY ELLIPTA) 100-62.5-25 MCG/INH AEPB Inhale into the lungs.     . folic acid (FOLVITE) 1 MG tablet Take 5 tablets (5 mg total) by mouth daily. (Patient taking differently: Take 800 mcg by mouth daily. ) 30 tablet 0  . HYDROcodone-acetaminophen (NORCO/VICODIN) 5-325 MG tablet Take 1 tablet every 6 (six) hours as needed by mouth for moderate pain.    . predniSONE (DELTASONE) 1 MG tablet Take 2.5 mg by mouth daily.  2  . rosuvastatin (CRESTOR) 20 MG tablet Take 10 mg every evening by mouth.     . Sennosides 8.6 MG CAPS Take by mouth.    . valACYclovir (VALTREX) 500 MG tablet Take 500 mg 2 (two) times daily by mouth.    . vitamin B-12 1000 MCG tablet Take 1 tablet (1,000 mcg total) by mouth daily. 30 tablet 0  . zoledronic acid (RECLAST) 5 MG/100ML SOLN Inject 5 mg into the vein once. Every 2 years      No current facility-administered medications for this encounter.     Physical Findings: The patient is in no acute distress. Patient is alert and oriented.  height is '5\' 9"'  (1.753 m) and weight is 163 lb 2 oz (74 kg). His temporal temperature is 98.3 F (36.8 C). His blood pressure is 133/76 and his pulse is 86. His respiration is 18 and oxygen saturation is 92%.   Lungs are clear to auscultation bilaterally. Heart has regular rate and  rhythm. No palpable cervical, supraclavicular, or axillary adenopathy. Abdomen soft, non-tender, normal bowel sounds.  Lab Findings: Lab Results  Component Value Date   WBC 6.2 06/30/2018   HGB 12.0 (L) 06/30/2018   HCT 37.1 (L) 06/30/2018   MCV 105.6 (H) 06/30/2018   PLT 213 06/30/2018    Radiographic Findings: Ct Chest Wo Contrast  Result Date: 03/14/2019 CLINICAL DATA:  Lung cancer, radiation therapy completed 2019. Rheumatoid arthritis. Fall on 03/09/2019 with right anterolateral rib pain. EXAM: CT CHEST WITHOUT CONTRAST TECHNIQUE: Multidetector CT imaging of the chest was performed following the standard protocol without IV contrast. COMPARISON:  09/27/2018. FINDINGS: Cardiovascular: Atherosclerotic calcification of  the aorta, aortic valve and coronary arteries. Heart size normal. No pericardial effusion. Mediastinum/Nodes: Mediastinal lymph nodes are not enlarged by CT size criteria. Calcified right hilar lymph node. Hilar regions are otherwise difficult to definitively evaluate without IV contrast. No axillary adenopathy. Esophagus is grossly unremarkable. Lungs/Pleura: Biapical pleuroparenchymal scarring. Severe centrilobular emphysema. Suspect basilar predominant superimposed subpleural fibrosis. Post treatment scarring in the left lower lobe. 4 mm apically left upper lobe nodule (series 5, image 35) and 6 mm posterior left upper lobe nodule (41) nodule are unchanged from 07/04/2008. Tiny right pleural effusion, new. Airway is unremarkable. Upper Abdomen: Liver is unremarkable. Faint tiny stones layer in the gallbladder. Visualized portions of the adrenal glands are unremarkable. Low-attenuation lesion off the upper pole right kidney measures 2.1 cm and is fluid in density, indicative of a cyst. Visualized portions of the left kidney, spleen, pancreas, stomach and bowel are unremarkable. No upper abdominal adenopathy. Musculoskeletal: Degenerative changes in the spine. No worrisome lytic or sclerotic lesions. Questionable nondisplaced fracture of the right eighth anterolateral rib. IMPRESSION: 1. Very questionable nondisplaced right anterolateral eighth rib fracture. 2. Trace right pleural effusion, new. 3. Post treatment scarring in the left lower lobe. 4. Emphysema (ICD10-J43.9). Suspect superimposed basilar subpleural fibrosis, possibly related collagen vascular disease. 5. Cholelithiasis. 6. Aortic atherosclerosis (ICD10-170.0). Coronary artery calcification. Electronically Signed   By: Lorin Picket M.D.   On: 03/14/2019 14:07    Impression:  ClinicalStage IA non-small cell lung cancer (adenocarcinoma). Patient appears to be in remission based on clinical exam and recent CT findings. He does not report any after  effects of his SBRT.   Plan:  Return in 6 months for chest CT scan and follow-up soon after CT scan.   ____________________________________  Blair Promise, PhD, MD  This document serves as a record of services personally performed by Gery Pray, MD. It was created on his behalf by Rae Lips, a trained medical scribe. The creation of this record is based on the scribe's personal observations and the provider's statements to them. This document has been checked and approved by the attending provider.

## 2019-03-15 NOTE — Patient Instructions (Signed)
Coronavirus (COVID-19) Are you at risk?  Are you at risk for the Coronavirus (COVID-19)?  To be considered HIGH RISK for Coronavirus (COVID-19), you have to meet the following criteria:  . Traveled to [place], [place], [place], [place] or [place]; or in the [place] to [place], [place], [place], or [place]; and have fever, cough, and shortness of breath within the last 2 weeks of travel OR . Been in close contact with a person diagnosed with COVID-19 within the last 2 weeks and have fever, cough, and shortness of breath . IF YOU DO NOT MEET THESE CRITERIA, YOU ARE CONSIDERED LOW RISK FOR COVID-19.  What to do if you are HIGH RISK for COVID-19?  . If you are having a medical emergency, call 911. . Seek medical care right away. Before you go to a doctor's office, urgent care or emergency department, call ahead and tell them about your recent travel, contact with someone diagnosed with COVID-19, and your symptoms. You should receive instructions from your physician's office regarding next steps of care.  . When you arrive at healthcare provider, tell the healthcare staff immediately you have returned from visiting [place], [place], [place], [place] or [place]; or traveled in the [place] to [place], [place], [place], or [place]; in the last two weeks or you have been in close contact with a person diagnosed with COVID-19 in the last 2 weeks.   . Tell the health care staff about your symptoms: fever, cough and shortness of breath. . After you have been seen by a medical provider, you will be either: o Tested for (COVID-19) and discharged home on quarantine except to seek medical care if symptoms worsen, and asked to  - Stay home and avoid contact with others until you get your results (4-5 days)  - Avoid travel on public transportation if possible (such as bus, train, or airplane) or o Sent to the Emergency Department by EMS for evaluation, COVID-19 testing, and possible  admission depending on your condition and test results.  What to do if you are LOW RISK for COVID-19?  Reduce your risk of any infection by using the same precautions used for avoiding the common cold or flu:  . Wash your hands often with soap and warm water for at least 20 seconds.  If soap and water are not readily available, use an alcohol-based hand sanitizer with at least 60% alcohol.  . If coughing or sneezing, cover your mouth and nose by coughing or sneezing into the elbow areas of your shirt or coat, into a tissue or into your sleeve (not your hands). . Avoid shaking hands with others and consider head nods or verbal greetings only. . Avoid touching your eyes, nose, or mouth with unwashed hands.  . Avoid close contact with people who are sick. . Avoid places or events with large numbers of people in one location, like concerts or sporting events. . Carefully consider travel plans you have or are making. . If you are planning any travel outside or inside the [place], visit the CDC's Travelers' Health webpage for the latest health notices. . If you have some symptoms but not all symptoms, continue to monitor at home and seek medical attention if your symptoms worsen. . If you are having a medical emergency, call 911.   ADDITIONAL HEALTHCARE OPTIONS FOR PATIENTS  [place] / e-Visit: [email or web address]         [facility]: [phone]  [facility]: [phone]                   [facility]: [phone]   

## 2019-03-21 DIAGNOSIS — M0579 Rheumatoid arthritis with rheumatoid factor of multiple sites without organ or systems involvement: Secondary | ICD-10-CM | POA: Diagnosis not present

## 2019-03-21 DIAGNOSIS — R5382 Chronic fatigue, unspecified: Secondary | ICD-10-CM | POA: Diagnosis not present

## 2019-03-21 DIAGNOSIS — M5030 Other cervical disc degeneration, unspecified cervical region: Secondary | ICD-10-CM | POA: Diagnosis not present

## 2019-03-21 DIAGNOSIS — M2550 Pain in unspecified joint: Secondary | ICD-10-CM | POA: Diagnosis not present

## 2019-03-21 DIAGNOSIS — M150 Primary generalized (osteo)arthritis: Secondary | ICD-10-CM | POA: Diagnosis not present

## 2019-03-21 DIAGNOSIS — B019 Varicella without complication: Secondary | ICD-10-CM | POA: Diagnosis not present

## 2019-03-21 DIAGNOSIS — D61818 Other pancytopenia: Secondary | ICD-10-CM | POA: Diagnosis not present

## 2019-03-22 DIAGNOSIS — C3432 Malignant neoplasm of lower lobe, left bronchus or lung: Secondary | ICD-10-CM | POA: Diagnosis not present

## 2019-03-22 DIAGNOSIS — F329 Major depressive disorder, single episode, unspecified: Secondary | ICD-10-CM | POA: Diagnosis not present

## 2019-03-22 DIAGNOSIS — Z1331 Encounter for screening for depression: Secondary | ICD-10-CM | POA: Diagnosis not present

## 2019-03-22 DIAGNOSIS — J8410 Pulmonary fibrosis, unspecified: Secondary | ICD-10-CM | POA: Diagnosis not present

## 2019-03-22 DIAGNOSIS — M069 Rheumatoid arthritis, unspecified: Secondary | ICD-10-CM | POA: Diagnosis not present

## 2019-03-22 DIAGNOSIS — M5489 Other dorsalgia: Secondary | ICD-10-CM | POA: Diagnosis not present

## 2019-03-22 DIAGNOSIS — I10 Essential (primary) hypertension: Secondary | ICD-10-CM | POA: Diagnosis not present

## 2019-03-22 DIAGNOSIS — E538 Deficiency of other specified B group vitamins: Secondary | ICD-10-CM | POA: Diagnosis not present

## 2019-03-22 DIAGNOSIS — G479 Sleep disorder, unspecified: Secondary | ICD-10-CM | POA: Diagnosis not present

## 2019-03-22 DIAGNOSIS — J449 Chronic obstructive pulmonary disease, unspecified: Secondary | ICD-10-CM | POA: Diagnosis not present

## 2019-03-22 DIAGNOSIS — L139 Bullous disorder, unspecified: Secondary | ICD-10-CM | POA: Diagnosis not present

## 2019-03-29 ENCOUNTER — Other Ambulatory Visit: Payer: Self-pay | Admitting: Radiation Oncology

## 2019-03-29 DIAGNOSIS — M0579 Rheumatoid arthritis with rheumatoid factor of multiple sites without organ or systems involvement: Secondary | ICD-10-CM | POA: Diagnosis not present

## 2019-04-09 DIAGNOSIS — L218 Other seborrheic dermatitis: Secondary | ICD-10-CM | POA: Diagnosis not present

## 2019-04-09 DIAGNOSIS — L905 Scar conditions and fibrosis of skin: Secondary | ICD-10-CM | POA: Diagnosis not present

## 2019-04-09 DIAGNOSIS — B019 Varicella without complication: Secondary | ICD-10-CM | POA: Diagnosis not present

## 2019-04-26 DIAGNOSIS — M0579 Rheumatoid arthritis with rheumatoid factor of multiple sites without organ or systems involvement: Secondary | ICD-10-CM | POA: Diagnosis not present

## 2019-05-24 DIAGNOSIS — M0579 Rheumatoid arthritis with rheumatoid factor of multiple sites without organ or systems involvement: Secondary | ICD-10-CM | POA: Diagnosis not present

## 2019-05-24 DIAGNOSIS — Z79899 Other long term (current) drug therapy: Secondary | ICD-10-CM | POA: Diagnosis not present

## 2019-06-21 DIAGNOSIS — M0579 Rheumatoid arthritis with rheumatoid factor of multiple sites without organ or systems involvement: Secondary | ICD-10-CM | POA: Diagnosis not present

## 2019-07-03 DIAGNOSIS — M0579 Rheumatoid arthritis with rheumatoid factor of multiple sites without organ or systems involvement: Secondary | ICD-10-CM | POA: Diagnosis not present

## 2019-07-03 DIAGNOSIS — M150 Primary generalized (osteo)arthritis: Secondary | ICD-10-CM | POA: Diagnosis not present

## 2019-07-03 DIAGNOSIS — D61818 Other pancytopenia: Secondary | ICD-10-CM | POA: Diagnosis not present

## 2019-07-03 DIAGNOSIS — M5030 Other cervical disc degeneration, unspecified cervical region: Secondary | ICD-10-CM | POA: Diagnosis not present

## 2019-07-03 DIAGNOSIS — B019 Varicella without complication: Secondary | ICD-10-CM | POA: Diagnosis not present

## 2019-07-03 DIAGNOSIS — R5382 Chronic fatigue, unspecified: Secondary | ICD-10-CM | POA: Diagnosis not present

## 2019-07-03 DIAGNOSIS — M2550 Pain in unspecified joint: Secondary | ICD-10-CM | POA: Diagnosis not present

## 2019-07-19 DIAGNOSIS — M0579 Rheumatoid arthritis with rheumatoid factor of multiple sites without organ or systems involvement: Secondary | ICD-10-CM | POA: Diagnosis not present

## 2019-07-26 ENCOUNTER — Encounter: Payer: Self-pay | Admitting: Gastroenterology

## 2019-08-07 DIAGNOSIS — G479 Sleep disorder, unspecified: Secondary | ICD-10-CM | POA: Diagnosis not present

## 2019-08-07 DIAGNOSIS — M069 Rheumatoid arthritis, unspecified: Secondary | ICD-10-CM | POA: Diagnosis not present

## 2019-08-07 DIAGNOSIS — R627 Adult failure to thrive: Secondary | ICD-10-CM | POA: Diagnosis not present

## 2019-08-07 DIAGNOSIS — M6281 Muscle weakness (generalized): Secondary | ICD-10-CM | POA: Diagnosis not present

## 2019-08-07 DIAGNOSIS — J8410 Pulmonary fibrosis, unspecified: Secondary | ICD-10-CM | POA: Diagnosis not present

## 2019-08-07 DIAGNOSIS — J449 Chronic obstructive pulmonary disease, unspecified: Secondary | ICD-10-CM | POA: Diagnosis not present

## 2019-08-07 DIAGNOSIS — C3432 Malignant neoplasm of lower lobe, left bronchus or lung: Secondary | ICD-10-CM | POA: Diagnosis not present

## 2019-08-07 DIAGNOSIS — Z659 Problem related to unspecified psychosocial circumstances: Secondary | ICD-10-CM | POA: Diagnosis not present

## 2019-08-07 DIAGNOSIS — E538 Deficiency of other specified B group vitamins: Secondary | ICD-10-CM | POA: Diagnosis not present

## 2019-08-07 DIAGNOSIS — M5489 Other dorsalgia: Secondary | ICD-10-CM | POA: Diagnosis not present

## 2019-08-29 ENCOUNTER — Emergency Department (HOSPITAL_COMMUNITY)
Admission: EM | Admit: 2019-08-29 | Discharge: 2019-08-29 | Disposition: A | Discharge status: Home / Self Care | Payer: Medicare Other | Attending: Emergency Medicine | Admitting: Emergency Medicine

## 2019-08-29 ENCOUNTER — Encounter (HOSPITAL_COMMUNITY): Payer: Self-pay | Admitting: *Deleted

## 2019-08-29 ENCOUNTER — Emergency Department (HOSPITAL_COMMUNITY): Payer: Medicare Other

## 2019-08-29 ENCOUNTER — Other Ambulatory Visit: Payer: Self-pay

## 2019-08-29 DIAGNOSIS — Z87891 Personal history of nicotine dependence: Secondary | ICD-10-CM | POA: Diagnosis not present

## 2019-08-29 DIAGNOSIS — Z923 Personal history of irradiation: Secondary | ICD-10-CM | POA: Insufficient documentation

## 2019-08-29 DIAGNOSIS — Z85118 Personal history of other malignant neoplasm of bronchus and lung: Secondary | ICD-10-CM | POA: Diagnosis not present

## 2019-08-29 DIAGNOSIS — R531 Weakness: Secondary | ICD-10-CM | POA: Insufficient documentation

## 2019-08-29 DIAGNOSIS — M7918 Myalgia, other site: Secondary | ICD-10-CM | POA: Diagnosis not present

## 2019-08-29 DIAGNOSIS — M5489 Other dorsalgia: Secondary | ICD-10-CM | POA: Diagnosis not present

## 2019-08-29 DIAGNOSIS — M791 Myalgia, unspecified site: Secondary | ICD-10-CM

## 2019-08-29 DIAGNOSIS — Z20828 Contact with and (suspected) exposure to other viral communicable diseases: Secondary | ICD-10-CM | POA: Insufficient documentation

## 2019-08-29 DIAGNOSIS — R630 Anorexia: Secondary | ICD-10-CM | POA: Diagnosis not present

## 2019-08-29 DIAGNOSIS — R3121 Asymptomatic microscopic hematuria: Secondary | ICD-10-CM | POA: Diagnosis not present

## 2019-08-29 DIAGNOSIS — R627 Adult failure to thrive: Secondary | ICD-10-CM | POA: Diagnosis not present

## 2019-08-29 DIAGNOSIS — M6281 Muscle weakness (generalized): Secondary | ICD-10-CM | POA: Diagnosis not present

## 2019-08-29 DIAGNOSIS — R05 Cough: Secondary | ICD-10-CM | POA: Diagnosis not present

## 2019-08-29 DIAGNOSIS — M069 Rheumatoid arthritis, unspecified: Secondary | ICD-10-CM | POA: Diagnosis not present

## 2019-08-29 DIAGNOSIS — Z79899 Other long term (current) drug therapy: Secondary | ICD-10-CM | POA: Diagnosis not present

## 2019-08-29 DIAGNOSIS — C3432 Malignant neoplasm of lower lobe, left bronchus or lung: Secondary | ICD-10-CM | POA: Diagnosis not present

## 2019-08-29 DIAGNOSIS — J449 Chronic obstructive pulmonary disease, unspecified: Secondary | ICD-10-CM | POA: Diagnosis not present

## 2019-08-29 LAB — CBC WITH DIFFERENTIAL/PLATELET
Abs Immature Granulocytes: 0.03 10*3/uL (ref 0.00–0.07)
Basophils Absolute: 0 10*3/uL (ref 0.0–0.1)
Basophils Relative: 0 %
Eosinophils Absolute: 0 10*3/uL (ref 0.0–0.5)
Eosinophils Relative: 1 %
HCT: 45.9 % (ref 39.0–52.0)
Hemoglobin: 15.3 g/dL (ref 13.0–17.0)
Immature Granulocytes: 1 %
Lymphocytes Relative: 24 %
Lymphs Abs: 1.2 10*3/uL (ref 0.7–4.0)
MCH: 37.3 pg — ABNORMAL HIGH (ref 26.0–34.0)
MCHC: 33.3 g/dL (ref 30.0–36.0)
MCV: 112 fL — ABNORMAL HIGH (ref 80.0–100.0)
Monocytes Absolute: 0.7 10*3/uL (ref 0.1–1.0)
Monocytes Relative: 14 %
Neutro Abs: 2.9 10*3/uL (ref 1.7–7.7)
Neutrophils Relative %: 60 %
Platelets: 123 10*3/uL — ABNORMAL LOW (ref 150–400)
RBC: 4.1 MIL/uL — ABNORMAL LOW (ref 4.22–5.81)
RDW: 12.4 % (ref 11.5–15.5)
WBC: 4.9 10*3/uL (ref 4.0–10.5)
nRBC: 0 % (ref 0.0–0.2)

## 2019-08-29 LAB — URINALYSIS, ROUTINE W REFLEX MICROSCOPIC
Bilirubin Urine: NEGATIVE
Glucose, UA: NEGATIVE mg/dL
Ketones, ur: NEGATIVE mg/dL
Leukocytes,Ua: NEGATIVE
Nitrite: NEGATIVE
Protein, ur: NEGATIVE mg/dL
Specific Gravity, Urine: 1.021 (ref 1.005–1.030)
pH: 5 (ref 5.0–8.0)

## 2019-08-29 LAB — COMPREHENSIVE METABOLIC PANEL
ALT: 33 U/L (ref 0–44)
AST: 40 U/L (ref 15–41)
Albumin: 3.2 g/dL — ABNORMAL LOW (ref 3.5–5.0)
Alkaline Phosphatase: 45 U/L (ref 38–126)
Anion gap: 9 (ref 5–15)
BUN: 17 mg/dL (ref 8–23)
CO2: 22 mmol/L (ref 22–32)
Calcium: 8.9 mg/dL (ref 8.9–10.3)
Chloride: 106 mmol/L (ref 98–111)
Creatinine, Ser: 1.38 mg/dL — ABNORMAL HIGH (ref 0.61–1.24)
GFR calc Af Amer: 59 mL/min — ABNORMAL LOW (ref >60)
GFR calc non Af Amer: 51 mL/min — ABNORMAL LOW (ref >60)
Glucose, Bld: 111 mg/dL — ABNORMAL HIGH (ref 70–99)
Potassium: 4.2 mmol/L (ref 3.5–5.1)
Sodium: 137 mmol/L (ref 135–145)
Total Bilirubin: 0.7 mg/dL (ref 0.3–1.2)
Total Protein: 6.9 g/dL (ref 6.5–8.1)

## 2019-08-29 MED ORDER — ACETAMINOPHEN 325 MG PO TABS: 650.0000 mg | ORAL_TABLET | Freq: Once | ORAL | Status: DC

## 2019-08-29 MED ORDER — HYDROCODONE-ACETAMINOPHEN 5-325 MG PO TABS
1.0000 | ORAL_TABLET | Freq: Once | ORAL | Status: AC
  Administered 2019-08-29: 1 via ORAL
  Filled 2019-08-29: qty 1

## 2019-08-29 MED ORDER — ONDANSETRON HCL 4 MG/2ML IJ SOLN
4.0000 mg | Freq: Once | INTRAMUSCULAR | Status: DC
  Filled 2019-08-29: qty 2

## 2019-08-29 MED ORDER — SODIUM CHLORIDE 0.9 % IV BOLUS
1000.0000 mL | Freq: Once | INTRAVENOUS | Status: AC
  Administered 2019-08-29: 19:00:00 1000 mL via INTRAVENOUS

## 2019-08-29 MED ORDER — SODIUM CHLORIDE 0.9 % IV BOLUS
500.0000 mL | Freq: Once | INTRAVENOUS | Status: AC
  Administered 2019-08-29: 500 mL via INTRAVENOUS

## 2019-08-29 NOTE — ED Provider Notes (Signed)
[facility] DEPT Provider Note   CSN: [id number] Arrival date & time: 08/29/19  1549     History   Chief Complaint Chief Complaint  Patient presents with  . Weakness    HPI Kenneth [name] is a 73 y.o. male.     The history is provided by the patient. No language interpreter was used.  Weakness Severity:  Moderate Onset quality:  Gradual Timing:  Constant Progression:  Worsening Chronicity:  Chronic Relieved by:  Nothing Worsened by:  Nothing Ineffective treatments:  None tried Associated symptoms: myalgias   Associated symptoms: no abdominal pain   Risk factors: no anemia    Pt reports he is not eating or drinking enough. Pt complains of body aches and reports he has been losing weight.  Past Medical History:  Diagnosis Date  . Abnormal LFTs   . Anxiety   . COPD (chronic obstructive pulmonary disease) (HCC)    emphysema.  . CTS (carpal tunnel syndrome)    Left  . DDD (degenerative disc disease)   . Depression    mild  . Elevated MCV   . Emphysema of lung ([place])   . Gout   . Gynecomastia   . Hearing loss   . History of radiation therapy 10/17/17-10/24/17   left lung treated to 54 Gy in 3 fractions  . Hyperlipidemia   . Microhematuria   . Nephrolithiasis   . Ocular migraine   . Panic attack   . Rheumatoid arthritis(714.0) dx 2000  . Situational hypertension   . Tobacco abuse     Patient Active Problem List   Diagnosis Date Noted  . Malnutrition of moderate degree 06/11/2018  . Pancytopenia ([place]) 06/09/2018  . HLD (hyperlipidemia) 06/09/2018  . Rheumatoid arthritis ([place]) 06/09/2018  . Cellulitis of right leg 06/09/2018  . Anemia due to folic acid deficiency 27/74/1287  . Non-small cell cancer of left lung ([place]) 09/05/2017  . Lung nodule 08/01/2017  . Protein-calorie malnutrition, severe 04/30/2016  . Esophagitis 04/29/2016  . Dysphagia 04/29/2016  . Odynophagia   . Abnormal loss of weight   . Emphysema 09/28/2011   . Interstitial fibrosis 09/28/2011    Past Surgical History:  Procedure Laterality Date  . APPENDECTOMY    . COLONOSCOPY    . CYSTOSTOMY W/ BLADDER BIOPSY  2006  . ESOPHAGOGASTRODUODENOSCOPY N/A 04/30/2016   Procedure: ESOPHAGOGASTRODUODENOSCOPY (EGD);  Surgeon: Doran Stabler, MD;  Location: [facility];  Service: Endoscopy;  Laterality: N/A;        Home Medications    Prior to Admission medications   Medication Sig Start Date End Date Taking? Authorizing Provider  acetaminophen (TYLENOL) 325 MG tablet Take 325 mg by mouth every 4 (four) hours as needed for mild pain.    Yes [provider]  amLODipine (NORVASC) 5 MG tablet Take 1 tablet daily by mouth. 08/06/17  Yes [provider]  Certolizumab Pegol (CIMZIA) 2 X 200 MG KIT Inject 1 each into the skin every 28 (twenty-eight) days.   Yes [provider]  cholecalciferol (VITAMIN D) 1000 UNITS tablet Take 1,000 Units by mouth daily.     Yes [provider]  clonazePAM (KLONOPIN) 0.5 MG tablet Take 0.5 mg by mouth 2 (two) times daily as needed for anxiety.    Yes [provider]  Dextromethorphan-Guaifenesin (TUSSIN DM PO) Take 1 tablet by mouth every 6 (six) hours as needed (cough and cold).    Yes [provider]  fluticasone (FLONASE) 50 MCG/ACT nasal  spray Place 2 sprays into the nose daily as needed for allergies.    Yes [provider]  Fluticasone-Umeclidin-Vilant (TRELEGY ELLIPTA) 100-62.5-25 MCG/INH AEPB Inhale 1 puff into the lungs daily.    Yes [provider]  folic acid (FOLVITE) 1 MG tablet Take 5 tablets (5 mg total) by mouth daily. Patient taking differently: Take 800 mcg by mouth daily.  06/16/18  Yes Aline August, MD  HYDROcodone-acetaminophen (NORCO/VICODIN) 5-325 MG tablet Take 1 tablet every 6 (six) hours as needed by mouth for moderate pain.   Yes [provider]  potassium chloride SA (KLOR-CON) 20 MEQ tablet Take 20 mEq by mouth  daily.   Yes [provider]  predniSONE (DELTASONE) 1 MG tablet Take 2.5 mg by mouth daily. 05/18/18  Yes [provider]  rosuvastatin (CRESTOR) 20 MG tablet Take 10 mg every evening by mouth.    Yes [provider]  senna (SENOKOT) 8.6 MG tablet Take 8.6 mg by mouth as needed.    Yes [provider]  valACYclovir (VALTREX) 500 MG tablet Take 500 mg 2 (two) times daily by mouth.   Yes [provider]  venlafaxine XR (EFFEXOR-XR) 37.5 MG 24 hr capsule Take 37.5 mg by mouth at bedtime. 08/07/19  Yes [provider]  vitamin B-12 1000 MCG tablet Take 1 tablet (1,000 mcg total) by mouth daily. 06/16/18  Yes Aline August, MD  zoledronic acid (RECLAST) 5 MG/100ML SOLN Inject 5 mg into the vein once. Every 2 years    Yes [provider]    Family History Family History  Problem Relation Age of Onset  . Lung cancer Father   . Asthma Paternal Grandmother   . Lung cancer Paternal Aunt   . Breast cancer Maternal Aunt   . Breast cancer Maternal Aunt   . Stomach cancer Maternal Uncle   . Colon cancer Neg Hx     Social History Social History   Tobacco Use  . Smoking status: Former Smoker    Packs/day: 0.50    Years: 44.00    Pack years: 22.00    Types: Cigarettes    Quit date: 12/05/2017    Years since quitting: 1.7  . Smokeless tobacco: Never Used  . Tobacco comment: "quit cold [place]"  Substance Use Topics  . Alcohol use: No  . Drug use: No     Allergies   Other, Aspirin, and Naproxen sodium   Review of Systems Review of Systems  Gastrointestinal: Negative for abdominal pain.  Musculoskeletal: Positive for myalgias.  Neurological: Positive for weakness.  All other systems reviewed and are negative.    Physical Exam Updated Vital Signs BP 136/72   Pulse 67   Temp (!) 97.5 F (36.4 C) (Oral)   Resp (!) 22   SpO2 94%   Physical Exam Vitals signs and nursing note reviewed.  Constitutional:       Appearance: He is well-developed. He is ill-appearing.  HENT:     Head: Normocephalic and atraumatic.     Nose: Nose normal.     Mouth/Throat:     Mouth: Mucous membranes are moist.  Eyes:     Conjunctiva/sclera: Conjunctivae normal.  Neck:     Musculoskeletal: Neck supple.  Cardiovascular:     Rate and Rhythm: Normal rate and regular rhythm.     Heart sounds: No murmur.  Pulmonary:     Effort: Pulmonary effort is normal. No respiratory distress.     Breath sounds: Normal breath sounds.  Abdominal:  Palpations: Abdomen is soft.     Tenderness: There is no abdominal tenderness.  Musculoskeletal: Normal range of motion.  Skin:    General: Skin is warm and dry.  Neurological:     General: No focal deficit present.     Mental Status: He is alert.  Psychiatric:        Mood and Affect: Mood normal.      ED Treatments / Results  Labs (all labs ordered are listed, but only abnormal results are displayed) Labs Reviewed  CBC WITH DIFFERENTIAL/PLATELET - Abnormal; Notable for the following components:      Result Value   RBC 4.10 (*)    MCV 112.0 (*)    MCH 37.3 (*)    Platelets 123 (*)    All other components within normal limits  COMPREHENSIVE METABOLIC PANEL - Abnormal; Notable for the following components:   Glucose, Bld 111 (*)    Creatinine, Ser 1.38 (*)    Albumin 3.2 (*)    GFR calc non Af Amer 51 (*)    GFR calc Af Amer 59 (*)    All other components within normal limits  URINALYSIS, ROUTINE W REFLEX MICROSCOPIC - Abnormal; Notable for the following components:   Hgb urine dipstick LARGE (*)    Bacteria, UA RARE (*)    All other components within normal limits  SARS CORONAVIRUS 2 (TAT 6-24 HRS)    EKG EKG Interpretation  Date/Time:  Wednesday August 29 2019 16:22:48 EST Ventricular Rate:  82 PR Interval:    QRS Duration: 93 QT Interval:  372 QTC Calculation: 435 R Axis:   47 Text Interpretation: Sinus rhythm Multiple premature complexes, vent &  supraven Borderline short PR interval Since last tracing ectopy is new Confirmed by Daleen Bo [phone]) on 08/29/2019 4:30:38 PM   Radiology Dg Chest Port 1 View  Result Date: 08/29/2019 CLINICAL DATA:  Cough EXAM: PORTABLE CHEST 1 VIEW COMPARISON:  09/01/2017, CT chest, 03/14/2019 FINDINGS: The heart size and mediastinal contours are within normal limits. Emphysema and bibasilar fibrotic scarring. The visualized skeletal structures are unremarkable. IMPRESSION: Emphysema and bibasilar fibrotic scarring. No acute appearing airspace opacity. Electronically Signed   By: Eddie Candle M.D.   On: 08/29/2019 17:20    Procedures Procedures (including critical care time)  Medications Ordered in ED Medications  ondansetron (ZOFRAN) injection 4 mg (0 mg Intravenous Hold 08/29/19 1741)  sodium chloride 0.9 % bolus 500 mL (0 mLs Intravenous Stopped 08/29/19 1841)  HYDROcodone-acetaminophen (NORCO/VICODIN) 5-325 MG per tablet 1 tablet (1 tablet Oral Given 08/29/19 1741)  sodium chloride 0.9 % bolus 1,000 mL (0 mLs Intravenous Stopped 08/29/19 2001)     Initial Impression / Assessment and Plan / ED Course  I have reviewed the triage vital signs and the nursing notes.  Pertinent labs & imaging results that were available during my care of the patient were reviewed by me and considered in my medical decision making (see chart for details).        MDM  Pt given Iv fluids x 1.5 liters.  Pt reports he feels better.  Pt taking small sips of fluids   Final Clinical Impressions(s) / ED Diagnoses   Final diagnoses:  Myalgia  Weakness    ED Discharge Orders    None    An After Visit Summary was printed and given to the patient.    Sidney Ace 08/29/19 2118    Daleen Bo, MD 08/29/19 [phone]

## 2019-08-29 NOTE — ED Notes (Signed)
Coming from PCP office-states he has been getting progressively weak over the past 3 days-states he has been unable to take PO's-history of lung cancer

## 2019-08-29 NOTE — ED Provider Notes (Signed)
  Face-to-face evaluation   History: He presents for malaise with general weakness.  He complains of achy joints.  He also has lost his appetite for several days.  He denies vomiting or dizziness.  Physical exam: Elderly, alert cooperative.  Abdomen soft nontender.  Heart regular rate and rhythm without murmur lungs clear anteriorly.  Medical screening examination/treatment/procedure(s) were conducted as a shared visit with non-physician practitioner(s) and myself.  I personally evaluated the patient during the encounter    Daleen Bo, MD 08/29/19 2349

## 2019-08-29 NOTE — Discharge Instructions (Signed)
See your Physician for recheck.  Make sure you are drinking enough fluids.

## 2019-08-30 LAB — SARS CORONAVIRUS 2 (TAT 6-24 HRS): SARS Coronavirus 2: NEGATIVE

## 2019-08-31 ENCOUNTER — Telehealth: Payer: Self-pay | Admitting: *Deleted

## 2019-08-31 DIAGNOSIS — M0579 Rheumatoid arthritis with rheumatoid factor of multiple sites without organ or systems involvement: Secondary | ICD-10-CM | POA: Diagnosis not present

## 2019-08-31 DIAGNOSIS — M810 Age-related osteoporosis without current pathological fracture: Secondary | ICD-10-CM | POA: Diagnosis not present

## 2019-08-31 DIAGNOSIS — E538 Deficiency of other specified B group vitamins: Secondary | ICD-10-CM | POA: Diagnosis not present

## 2019-08-31 DIAGNOSIS — G8929 Other chronic pain: Secondary | ICD-10-CM | POA: Diagnosis not present

## 2019-08-31 DIAGNOSIS — D61818 Other pancytopenia: Secondary | ICD-10-CM | POA: Diagnosis not present

## 2019-08-31 DIAGNOSIS — G43809 Other migraine, not intractable, without status migrainosus: Secondary | ICD-10-CM | POA: Diagnosis not present

## 2019-08-31 DIAGNOSIS — I10 Essential (primary) hypertension: Secondary | ICD-10-CM | POA: Diagnosis not present

## 2019-08-31 DIAGNOSIS — F329 Major depressive disorder, single episode, unspecified: Secondary | ICD-10-CM | POA: Diagnosis not present

## 2019-08-31 DIAGNOSIS — H9190 Unspecified hearing loss, unspecified ear: Secondary | ICD-10-CM | POA: Diagnosis not present

## 2019-08-31 DIAGNOSIS — Z7952 Long term (current) use of systemic steroids: Secondary | ICD-10-CM | POA: Diagnosis not present

## 2019-08-31 DIAGNOSIS — M519 Unspecified thoracic, thoracolumbar and lumbosacral intervertebral disc disorder: Secondary | ICD-10-CM | POA: Diagnosis not present

## 2019-08-31 DIAGNOSIS — Z7951 Long term (current) use of inhaled steroids: Secondary | ICD-10-CM | POA: Diagnosis not present

## 2019-08-31 DIAGNOSIS — F410 Panic disorder [episodic paroxysmal anxiety] without agoraphobia: Secondary | ICD-10-CM | POA: Diagnosis not present

## 2019-08-31 DIAGNOSIS — R627 Adult failure to thrive: Secondary | ICD-10-CM | POA: Diagnosis not present

## 2019-08-31 DIAGNOSIS — Z79899 Other long term (current) drug therapy: Secondary | ICD-10-CM | POA: Diagnosis not present

## 2019-08-31 DIAGNOSIS — J8410 Pulmonary fibrosis, unspecified: Secondary | ICD-10-CM | POA: Diagnosis not present

## 2019-08-31 DIAGNOSIS — E876 Hypokalemia: Secondary | ICD-10-CM | POA: Diagnosis not present

## 2019-08-31 DIAGNOSIS — G479 Sleep disorder, unspecified: Secondary | ICD-10-CM | POA: Diagnosis not present

## 2019-08-31 DIAGNOSIS — C3432 Malignant neoplasm of lower lobe, left bronchus or lung: Secondary | ICD-10-CM | POA: Diagnosis not present

## 2019-08-31 DIAGNOSIS — J449 Chronic obstructive pulmonary disease, unspecified: Secondary | ICD-10-CM | POA: Diagnosis not present

## 2019-08-31 DIAGNOSIS — M6281 Muscle weakness (generalized): Secondary | ICD-10-CM | POA: Diagnosis not present

## 2019-08-31 DIAGNOSIS — Z79891 Long term (current) use of opiate analgesic: Secondary | ICD-10-CM | POA: Diagnosis not present

## 2019-08-31 DIAGNOSIS — E785 Hyperlipidemia, unspecified: Secondary | ICD-10-CM | POA: Diagnosis not present

## 2019-08-31 DIAGNOSIS — Z86718 Personal history of other venous thrombosis and embolism: Secondary | ICD-10-CM | POA: Diagnosis not present

## 2019-08-31 DIAGNOSIS — M109 Gout, unspecified: Secondary | ICD-10-CM | POA: Diagnosis not present

## 2019-08-31 NOTE — Telephone Encounter (Signed)
CALLED PATIENT TO INFORM OF CT FOR 09-07-19 - ARRIVAL TIME- 12:15 PM @ [facility], NO RESTRICTIONS TO TEST, PATIENT TO RECEIVE RESULTS FROM DR. KINARD ON 09-10-19 @ 3:15 PM, SPOKE WITH PATIENT'S WIFE - MARY AND SHE IS AWARE OF THESE APPTS.

## 2019-09-03 DIAGNOSIS — M0579 Rheumatoid arthritis with rheumatoid factor of multiple sites without organ or systems involvement: Secondary | ICD-10-CM | POA: Diagnosis not present

## 2019-09-03 DIAGNOSIS — E785 Hyperlipidemia, unspecified: Secondary | ICD-10-CM | POA: Diagnosis not present

## 2019-09-03 DIAGNOSIS — F410 Panic disorder [episodic paroxysmal anxiety] without agoraphobia: Secondary | ICD-10-CM | POA: Diagnosis not present

## 2019-09-03 DIAGNOSIS — I10 Essential (primary) hypertension: Secondary | ICD-10-CM | POA: Diagnosis not present

## 2019-09-03 DIAGNOSIS — J8410 Pulmonary fibrosis, unspecified: Secondary | ICD-10-CM | POA: Diagnosis not present

## 2019-09-03 DIAGNOSIS — J449 Chronic obstructive pulmonary disease, unspecified: Secondary | ICD-10-CM | POA: Diagnosis not present

## 2019-09-05 ENCOUNTER — Other Ambulatory Visit: Payer: Self-pay

## 2019-09-05 DIAGNOSIS — Z23 Encounter for immunization: Secondary | ICD-10-CM | POA: Diagnosis not present

## 2019-09-05 DIAGNOSIS — C3432 Malignant neoplasm of lower lobe, left bronchus or lung: Secondary | ICD-10-CM | POA: Diagnosis not present

## 2019-09-05 DIAGNOSIS — J8410 Pulmonary fibrosis, unspecified: Secondary | ICD-10-CM | POA: Diagnosis not present

## 2019-09-05 DIAGNOSIS — I8290 Acute embolism and thrombosis of unspecified vein: Secondary | ICD-10-CM | POA: Diagnosis not present

## 2019-09-05 DIAGNOSIS — E538 Deficiency of other specified B group vitamins: Secondary | ICD-10-CM | POA: Diagnosis not present

## 2019-09-05 DIAGNOSIS — R630 Anorexia: Secondary | ICD-10-CM | POA: Diagnosis not present

## 2019-09-05 DIAGNOSIS — J449 Chronic obstructive pulmonary disease, unspecified: Secondary | ICD-10-CM | POA: Diagnosis not present

## 2019-09-05 DIAGNOSIS — M5489 Other dorsalgia: Secondary | ICD-10-CM | POA: Diagnosis not present

## 2019-09-05 DIAGNOSIS — D61818 Other pancytopenia: Secondary | ICD-10-CM | POA: Diagnosis not present

## 2019-09-05 DIAGNOSIS — I10 Essential (primary) hypertension: Secondary | ICD-10-CM | POA: Diagnosis not present

## 2019-09-05 DIAGNOSIS — R627 Adult failure to thrive: Secondary | ICD-10-CM | POA: Diagnosis not present

## 2019-09-05 DIAGNOSIS — M069 Rheumatoid arthritis, unspecified: Secondary | ICD-10-CM | POA: Diagnosis not present

## 2019-09-06 DIAGNOSIS — F410 Panic disorder [episodic paroxysmal anxiety] without agoraphobia: Secondary | ICD-10-CM | POA: Diagnosis not present

## 2019-09-06 DIAGNOSIS — E785 Hyperlipidemia, unspecified: Secondary | ICD-10-CM | POA: Diagnosis not present

## 2019-09-06 DIAGNOSIS — M0579 Rheumatoid arthritis with rheumatoid factor of multiple sites without organ or systems involvement: Secondary | ICD-10-CM | POA: Diagnosis not present

## 2019-09-06 DIAGNOSIS — I10 Essential (primary) hypertension: Secondary | ICD-10-CM | POA: Diagnosis not present

## 2019-09-06 DIAGNOSIS — J8410 Pulmonary fibrosis, unspecified: Secondary | ICD-10-CM | POA: Diagnosis not present

## 2019-09-06 DIAGNOSIS — J449 Chronic obstructive pulmonary disease, unspecified: Secondary | ICD-10-CM | POA: Diagnosis not present

## 2019-09-07 ENCOUNTER — Ambulatory Visit (HOSPITAL_COMMUNITY)
Admission: RE | Admit: 2019-09-07 | Discharge: 2019-09-07 | Disposition: A | Discharge status: Home / Self Care | Payer: Medicare Other | Source: Ambulatory Visit | Attending: Radiation Oncology | Admitting: Radiation Oncology

## 2019-09-07 ENCOUNTER — Other Ambulatory Visit: Payer: Self-pay

## 2019-09-07 DIAGNOSIS — C3492 Malignant neoplasm of unspecified part of left bronchus or lung: Secondary | ICD-10-CM | POA: Diagnosis not present

## 2019-09-07 DIAGNOSIS — C3432 Malignant neoplasm of lower lobe, left bronchus or lung: Secondary | ICD-10-CM | POA: Diagnosis not present

## 2019-09-07 DIAGNOSIS — J8410 Pulmonary fibrosis, unspecified: Secondary | ICD-10-CM | POA: Diagnosis not present

## 2019-09-10 ENCOUNTER — Ambulatory Visit: Payer: Medicare Other | Admitting: Radiation Oncology

## 2019-09-11 ENCOUNTER — Other Ambulatory Visit: Payer: Self-pay | Admitting: Radiation Oncology

## 2019-09-11 DIAGNOSIS — C3492 Malignant neoplasm of unspecified part of left bronchus or lung: Secondary | ICD-10-CM

## 2019-09-11 DIAGNOSIS — F410 Panic disorder [episodic paroxysmal anxiety] without agoraphobia: Secondary | ICD-10-CM | POA: Diagnosis not present

## 2019-09-11 DIAGNOSIS — J449 Chronic obstructive pulmonary disease, unspecified: Secondary | ICD-10-CM | POA: Diagnosis not present

## 2019-09-11 DIAGNOSIS — E785 Hyperlipidemia, unspecified: Secondary | ICD-10-CM | POA: Diagnosis not present

## 2019-09-11 DIAGNOSIS — J8410 Pulmonary fibrosis, unspecified: Secondary | ICD-10-CM | POA: Diagnosis not present

## 2019-09-11 DIAGNOSIS — M0579 Rheumatoid arthritis with rheumatoid factor of multiple sites without organ or systems involvement: Secondary | ICD-10-CM | POA: Diagnosis not present

## 2019-09-11 DIAGNOSIS — I10 Essential (primary) hypertension: Secondary | ICD-10-CM | POA: Diagnosis not present

## 2019-09-15 DIAGNOSIS — J8410 Pulmonary fibrosis, unspecified: Secondary | ICD-10-CM | POA: Diagnosis not present

## 2019-09-15 DIAGNOSIS — M0579 Rheumatoid arthritis with rheumatoid factor of multiple sites without organ or systems involvement: Secondary | ICD-10-CM | POA: Diagnosis not present

## 2019-09-15 DIAGNOSIS — E785 Hyperlipidemia, unspecified: Secondary | ICD-10-CM | POA: Diagnosis not present

## 2019-09-15 DIAGNOSIS — J449 Chronic obstructive pulmonary disease, unspecified: Secondary | ICD-10-CM | POA: Diagnosis not present

## 2019-09-15 DIAGNOSIS — I10 Essential (primary) hypertension: Secondary | ICD-10-CM | POA: Diagnosis not present

## 2019-09-15 DIAGNOSIS — F410 Panic disorder [episodic paroxysmal anxiety] without agoraphobia: Secondary | ICD-10-CM | POA: Diagnosis not present

## 2019-09-18 DIAGNOSIS — J449 Chronic obstructive pulmonary disease, unspecified: Secondary | ICD-10-CM | POA: Diagnosis not present

## 2019-09-18 DIAGNOSIS — I10 Essential (primary) hypertension: Secondary | ICD-10-CM | POA: Diagnosis not present

## 2019-09-18 DIAGNOSIS — E785 Hyperlipidemia, unspecified: Secondary | ICD-10-CM | POA: Diagnosis not present

## 2019-09-18 DIAGNOSIS — F410 Panic disorder [episodic paroxysmal anxiety] without agoraphobia: Secondary | ICD-10-CM | POA: Diagnosis not present

## 2019-09-18 DIAGNOSIS — M0579 Rheumatoid arthritis with rheumatoid factor of multiple sites without organ or systems involvement: Secondary | ICD-10-CM | POA: Diagnosis not present

## 2019-09-18 DIAGNOSIS — J8410 Pulmonary fibrosis, unspecified: Secondary | ICD-10-CM | POA: Diagnosis not present

## 2019-09-19 DIAGNOSIS — J8410 Pulmonary fibrosis, unspecified: Secondary | ICD-10-CM | POA: Diagnosis not present

## 2019-09-19 DIAGNOSIS — E785 Hyperlipidemia, unspecified: Secondary | ICD-10-CM | POA: Diagnosis not present

## 2019-09-19 DIAGNOSIS — I10 Essential (primary) hypertension: Secondary | ICD-10-CM | POA: Diagnosis not present

## 2019-09-19 DIAGNOSIS — F410 Panic disorder [episodic paroxysmal anxiety] without agoraphobia: Secondary | ICD-10-CM | POA: Diagnosis not present

## 2019-09-19 DIAGNOSIS — J449 Chronic obstructive pulmonary disease, unspecified: Secondary | ICD-10-CM | POA: Diagnosis not present

## 2019-09-19 DIAGNOSIS — M0579 Rheumatoid arthritis with rheumatoid factor of multiple sites without organ or systems involvement: Secondary | ICD-10-CM | POA: Diagnosis not present

## 2019-09-20 DIAGNOSIS — E785 Hyperlipidemia, unspecified: Secondary | ICD-10-CM | POA: Diagnosis not present

## 2019-09-20 DIAGNOSIS — I10 Essential (primary) hypertension: Secondary | ICD-10-CM | POA: Diagnosis not present

## 2019-09-20 DIAGNOSIS — M0579 Rheumatoid arthritis with rheumatoid factor of multiple sites without organ or systems involvement: Secondary | ICD-10-CM | POA: Diagnosis not present

## 2019-09-20 DIAGNOSIS — J8410 Pulmonary fibrosis, unspecified: Secondary | ICD-10-CM | POA: Diagnosis not present

## 2019-09-20 DIAGNOSIS — N390 Urinary tract infection, site not specified: Secondary | ICD-10-CM | POA: Diagnosis not present

## 2019-09-20 DIAGNOSIS — F410 Panic disorder [episodic paroxysmal anxiety] without agoraphobia: Secondary | ICD-10-CM | POA: Diagnosis not present

## 2019-09-20 DIAGNOSIS — J449 Chronic obstructive pulmonary disease, unspecified: Secondary | ICD-10-CM | POA: Diagnosis not present

## 2019-09-20 DIAGNOSIS — R399 Unspecified symptoms and signs involving the genitourinary system: Secondary | ICD-10-CM | POA: Diagnosis not present

## 2019-09-21 DIAGNOSIS — J8410 Pulmonary fibrosis, unspecified: Secondary | ICD-10-CM | POA: Diagnosis not present

## 2019-09-21 DIAGNOSIS — F410 Panic disorder [episodic paroxysmal anxiety] without agoraphobia: Secondary | ICD-10-CM | POA: Diagnosis not present

## 2019-09-21 DIAGNOSIS — I10 Essential (primary) hypertension: Secondary | ICD-10-CM | POA: Diagnosis not present

## 2019-09-21 DIAGNOSIS — J449 Chronic obstructive pulmonary disease, unspecified: Secondary | ICD-10-CM | POA: Diagnosis not present

## 2019-09-21 DIAGNOSIS — M0579 Rheumatoid arthritis with rheumatoid factor of multiple sites without organ or systems involvement: Secondary | ICD-10-CM | POA: Diagnosis not present

## 2019-09-21 DIAGNOSIS — E785 Hyperlipidemia, unspecified: Secondary | ICD-10-CM | POA: Diagnosis not present

## 2019-09-24 DIAGNOSIS — F410 Panic disorder [episodic paroxysmal anxiety] without agoraphobia: Secondary | ICD-10-CM | POA: Diagnosis not present

## 2019-09-24 DIAGNOSIS — E785 Hyperlipidemia, unspecified: Secondary | ICD-10-CM | POA: Diagnosis not present

## 2019-09-24 DIAGNOSIS — J449 Chronic obstructive pulmonary disease, unspecified: Secondary | ICD-10-CM | POA: Diagnosis not present

## 2019-09-24 DIAGNOSIS — J8410 Pulmonary fibrosis, unspecified: Secondary | ICD-10-CM | POA: Diagnosis not present

## 2019-09-24 DIAGNOSIS — M0579 Rheumatoid arthritis with rheumatoid factor of multiple sites without organ or systems involvement: Secondary | ICD-10-CM | POA: Diagnosis not present

## 2019-09-24 DIAGNOSIS — I10 Essential (primary) hypertension: Secondary | ICD-10-CM | POA: Diagnosis not present

## 2019-09-26 DIAGNOSIS — M0579 Rheumatoid arthritis with rheumatoid factor of multiple sites without organ or systems involvement: Secondary | ICD-10-CM | POA: Diagnosis not present

## 2019-09-26 DIAGNOSIS — F410 Panic disorder [episodic paroxysmal anxiety] without agoraphobia: Secondary | ICD-10-CM | POA: Diagnosis not present

## 2019-09-26 DIAGNOSIS — J449 Chronic obstructive pulmonary disease, unspecified: Secondary | ICD-10-CM | POA: Diagnosis not present

## 2019-09-26 DIAGNOSIS — I10 Essential (primary) hypertension: Secondary | ICD-10-CM | POA: Diagnosis not present

## 2019-09-26 DIAGNOSIS — E785 Hyperlipidemia, unspecified: Secondary | ICD-10-CM | POA: Diagnosis not present

## 2019-09-26 DIAGNOSIS — J8410 Pulmonary fibrosis, unspecified: Secondary | ICD-10-CM | POA: Diagnosis not present

## 2019-09-27 ENCOUNTER — Other Ambulatory Visit: Payer: Self-pay

## 2019-09-27 ENCOUNTER — Ambulatory Visit (INDEPENDENT_AMBULATORY_CARE_PROVIDER_SITE_OTHER): Payer: Medicare Other | Admitting: Podiatry

## 2019-09-27 ENCOUNTER — Encounter: Payer: Self-pay | Admitting: Podiatry

## 2019-09-27 VITALS — BP 125/83 | HR 100 | Resp 16

## 2019-09-27 DIAGNOSIS — B351 Tinea unguium: Secondary | ICD-10-CM | POA: Diagnosis not present

## 2019-09-27 DIAGNOSIS — I10 Essential (primary) hypertension: Secondary | ICD-10-CM | POA: Diagnosis not present

## 2019-09-27 DIAGNOSIS — M0579 Rheumatoid arthritis with rheumatoid factor of multiple sites without organ or systems involvement: Secondary | ICD-10-CM | POA: Diagnosis not present

## 2019-09-27 DIAGNOSIS — J449 Chronic obstructive pulmonary disease, unspecified: Secondary | ICD-10-CM | POA: Diagnosis not present

## 2019-09-27 DIAGNOSIS — E785 Hyperlipidemia, unspecified: Secondary | ICD-10-CM | POA: Diagnosis not present

## 2019-09-27 DIAGNOSIS — M79676 Pain in unspecified toe(s): Secondary | ICD-10-CM

## 2019-09-27 DIAGNOSIS — F410 Panic disorder [episodic paroxysmal anxiety] without agoraphobia: Secondary | ICD-10-CM | POA: Diagnosis not present

## 2019-09-27 DIAGNOSIS — J8410 Pulmonary fibrosis, unspecified: Secondary | ICD-10-CM | POA: Diagnosis not present

## 2019-09-27 NOTE — Progress Notes (Signed)
Subjective:  Patient ID: PRANISH AKHAVAN, male    DOB: 11-Aug-1946,  MRN: [id number] HPI Chief Complaint  Patient presents with  . Debridement    Requesting nail care - left hallux tender around toenail  . New Patient (Initial Visit)    73 y.o. male presents with the above complaint.   ROS: Denies fever chills nausea vomiting muscle aches pains calf pain back pain chest pain shortness of breath.  Other than identified past medical history.  Past Medical History:  Diagnosis Date  . Abnormal LFTs   . Anxiety   . COPD (chronic obstructive pulmonary disease) (HCC)    emphysema.  . CTS (carpal tunnel syndrome)    Left  . DDD (degenerative disc disease)   . Depression    mild  . Elevated MCV   . Emphysema of lung ([place])   . Gout   . Gynecomastia   . Hearing loss   . History of radiation therapy 10/17/17-10/24/17   left lung treated to 54 Gy in 3 fractions  . Hyperlipidemia   . Microhematuria   . Nephrolithiasis   . Ocular migraine   . Panic attack   . Rheumatoid arthritis(714.0) dx 2000  . Situational hypertension   . Tobacco abuse    Past Surgical History:  Procedure Laterality Date  . APPENDECTOMY    . COLONOSCOPY    . CYSTOSTOMY W/ BLADDER BIOPSY  2006  . ESOPHAGOGASTRODUODENOSCOPY N/A 04/30/2016   Procedure: ESOPHAGOGASTRODUODENOSCOPY (EGD);  Surgeon: Doran Stabler, MD;  Location: [facility];  Service: Endoscopy;  Laterality: N/A;    Current Outpatient Medications:  .  acetaminophen (TYLENOL) 325 MG tablet, Take 325 mg by mouth every 4 (four) hours as needed for mild pain. , Disp: , Rfl:  .  amLODipine (NORVASC) 5 MG tablet, Take 1 tablet daily by mouth., Disp: , Rfl: 3 .  Certolizumab Pegol (CIMZIA) 2 X 200 MG KIT, Inject 1 each into the skin every 28 (twenty-eight) days., Disp: , Rfl:  .  cholecalciferol (VITAMIN D) 1000 UNITS tablet, Take 1,000 Units by mouth daily.  , Disp: , Rfl:  .  clonazePAM (KLONOPIN) 0.5 MG tablet, Take 0.5 mg by mouth 2 (two) times daily  as needed for anxiety. , Disp: , Rfl:  .  Dextromethorphan-Guaifenesin (TUSSIN DM PO), Take 1 tablet by mouth every 6 (six) hours as needed (cough and cold). , Disp: , Rfl:  .  fluticasone (FLONASE) 50 MCG/ACT nasal spray, Place 2 sprays into the nose daily as needed for allergies. , Disp: , Rfl:  .  Fluticasone-Umeclidin-Vilant (TRELEGY ELLIPTA) 100-62.5-25 MCG/INH AEPB, Inhale 1 puff into the lungs daily. , Disp: , Rfl:  .  folic acid (FOLVITE) 1 MG tablet, Take 5 tablets (5 mg total) by mouth daily. (Patient taking differently: Take 800 mcg by mouth daily. ), Disp: 30 tablet, Rfl: 0 .  HYDROcodone-acetaminophen (NORCO/VICODIN) 5-325 MG tablet, Take 1 tablet every 6 (six) hours as needed by mouth for moderate pain., Disp: , Rfl:  .  megestrol (MEGACE) 40 MG/ML suspension, Take 400 mg by mouth 2 (two) times daily., Disp: , Rfl:  .  potassium chloride SA (KLOR-CON) 20 MEQ tablet, Take 20 mEq by mouth daily., Disp: , Rfl:  .  predniSONE (DELTASONE) 1 MG tablet, Take 2.5 mg by mouth daily., Disp: , Rfl: 2 .  rosuvastatin (CRESTOR) 20 MG tablet, Take 10 mg every evening by mouth. , Disp: , Rfl:  .  senna (SENOKOT) 8.6 MG tablet, Take 8.6 mg  by mouth as needed. , Disp: , Rfl:  .  valACYclovir (VALTREX) 500 MG tablet, Take 500 mg 2 (two) times daily by mouth., Disp: , Rfl:  .  venlafaxine XR (EFFEXOR-XR) 37.5 MG 24 hr capsule, Take 37.5 mg by mouth at bedtime., Disp: , Rfl:  .  vitamin B-12 1000 MCG tablet, Take 1 tablet (1,000 mcg total) by mouth daily., Disp: 30 tablet, Rfl: 0 .  zoledronic acid (RECLAST) 5 MG/100ML SOLN, Inject 5 mg into the vein once. Every 2 years , Disp: , Rfl:   Allergies  Allergen Reactions  . Other     "CILLIN" Family  Has patient had a PCN reaction causing immediate rash, facial/tongue/throat swelling, SOB or lightheadedness with hypotension: no Has patient had a PCN reaction causing severe rash involving mucus membranes or skin necrosis: yes Has patient had a PCN  reaction that required hospitalization: no Has patient had a PCN reaction occurring within the last 10 years: no If all of the above answers are "NO", then may proceed with Cephalosporin use.   . Aspirin Other (See Comments)    Gi upset  . Naproxen Sodium Other (See Comments)    Whelps in large doses **ALEVE**   Review of Systems Objective:   Vitals:   09/27/19 1352  BP: 125/83  Pulse: 100  Resp: 16    General: Well developed, nourished, in no acute distress, alert and oriented x3   Dermatological: Skin is warm, dry and supple bilateral. Nails x 10 are thickened and dystrophic.  Remaining integument appears unremarkable at this time. There are no open sores, no preulcerative lesions, no rash or signs of infection present.  Vascular: Dorsalis Pedis artery and Posterior Tibial artery pedal pulses are 2/4 bilateral with immedate capillary fill time. Pedal hair growth present. No varicosities and no lower extremity edema present bilateral.   Neruologic: Grossly intact via light touch bilateral. Vibratory intact via tuning fork bilateral. Protective threshold with Semmes Wienstein monofilament intact to all pedal sites bilateral. Patellar and Achilles deep tendon reflexes 2+ bilateral. No Babinski or clonus noted bilateral.   Musculoskeletal: No gross boney pedal deformities bilateral. No pain, crepitus, or limitation noted with foot and ankle range of motion bilateral. Muscular strength 5/5 in all groups tested bilateral.  Gait: Unassisted, Nonantalgic.    Radiographs:  None taken  Assessment & Plan:   Assessment: Painful elongated toenails bilaterally.  Plan: Debrided nails 1 through 5 bilaterally and recommended emollients.  He will also follow-up with one of the local doctors for routine care.     Azaliyah Kennard T. [place], [place]

## 2019-09-28 DIAGNOSIS — E785 Hyperlipidemia, unspecified: Secondary | ICD-10-CM | POA: Diagnosis not present

## 2019-09-28 DIAGNOSIS — J449 Chronic obstructive pulmonary disease, unspecified: Secondary | ICD-10-CM | POA: Diagnosis not present

## 2019-09-28 DIAGNOSIS — J8410 Pulmonary fibrosis, unspecified: Secondary | ICD-10-CM | POA: Diagnosis not present

## 2019-09-28 DIAGNOSIS — M0579 Rheumatoid arthritis with rheumatoid factor of multiple sites without organ or systems involvement: Secondary | ICD-10-CM | POA: Diagnosis not present

## 2019-09-28 DIAGNOSIS — F410 Panic disorder [episodic paroxysmal anxiety] without agoraphobia: Secondary | ICD-10-CM | POA: Diagnosis not present

## 2019-09-28 DIAGNOSIS — I10 Essential (primary) hypertension: Secondary | ICD-10-CM | POA: Diagnosis not present

## 2019-09-29 DIAGNOSIS — F410 Panic disorder [episodic paroxysmal anxiety] without agoraphobia: Secondary | ICD-10-CM | POA: Diagnosis not present

## 2019-09-29 DIAGNOSIS — I10 Essential (primary) hypertension: Secondary | ICD-10-CM | POA: Diagnosis not present

## 2019-09-29 DIAGNOSIS — J449 Chronic obstructive pulmonary disease, unspecified: Secondary | ICD-10-CM | POA: Diagnosis not present

## 2019-09-29 DIAGNOSIS — M0579 Rheumatoid arthritis with rheumatoid factor of multiple sites without organ or systems involvement: Secondary | ICD-10-CM | POA: Diagnosis not present

## 2019-09-29 DIAGNOSIS — J8410 Pulmonary fibrosis, unspecified: Secondary | ICD-10-CM | POA: Diagnosis not present

## 2019-09-29 DIAGNOSIS — E785 Hyperlipidemia, unspecified: Secondary | ICD-10-CM | POA: Diagnosis not present

## 2019-09-30 DIAGNOSIS — Z7952 Long term (current) use of systemic steroids: Secondary | ICD-10-CM | POA: Diagnosis not present

## 2019-09-30 DIAGNOSIS — Z79899 Other long term (current) drug therapy: Secondary | ICD-10-CM | POA: Diagnosis not present

## 2019-09-30 DIAGNOSIS — E538 Deficiency of other specified B group vitamins: Secondary | ICD-10-CM | POA: Diagnosis not present

## 2019-09-30 DIAGNOSIS — M109 Gout, unspecified: Secondary | ICD-10-CM | POA: Diagnosis not present

## 2019-09-30 DIAGNOSIS — F410 Panic disorder [episodic paroxysmal anxiety] without agoraphobia: Secondary | ICD-10-CM | POA: Diagnosis not present

## 2019-09-30 DIAGNOSIS — Z7951 Long term (current) use of inhaled steroids: Secondary | ICD-10-CM | POA: Diagnosis not present

## 2019-09-30 DIAGNOSIS — Z86718 Personal history of other venous thrombosis and embolism: Secondary | ICD-10-CM | POA: Diagnosis not present

## 2019-09-30 DIAGNOSIS — J8410 Pulmonary fibrosis, unspecified: Secondary | ICD-10-CM | POA: Diagnosis not present

## 2019-09-30 DIAGNOSIS — G8929 Other chronic pain: Secondary | ICD-10-CM | POA: Diagnosis not present

## 2019-09-30 DIAGNOSIS — R627 Adult failure to thrive: Secondary | ICD-10-CM | POA: Diagnosis not present

## 2019-09-30 DIAGNOSIS — E785 Hyperlipidemia, unspecified: Secondary | ICD-10-CM | POA: Diagnosis not present

## 2019-09-30 DIAGNOSIS — C3432 Malignant neoplasm of lower lobe, left bronchus or lung: Secondary | ICD-10-CM | POA: Diagnosis not present

## 2019-09-30 DIAGNOSIS — G43809 Other migraine, not intractable, without status migrainosus: Secondary | ICD-10-CM | POA: Diagnosis not present

## 2019-09-30 DIAGNOSIS — F329 Major depressive disorder, single episode, unspecified: Secondary | ICD-10-CM | POA: Diagnosis not present

## 2019-09-30 DIAGNOSIS — D61818 Other pancytopenia: Secondary | ICD-10-CM | POA: Diagnosis not present

## 2019-09-30 DIAGNOSIS — Z79891 Long term (current) use of opiate analgesic: Secondary | ICD-10-CM | POA: Diagnosis not present

## 2019-09-30 DIAGNOSIS — E876 Hypokalemia: Secondary | ICD-10-CM | POA: Diagnosis not present

## 2019-09-30 DIAGNOSIS — M0579 Rheumatoid arthritis with rheumatoid factor of multiple sites without organ or systems involvement: Secondary | ICD-10-CM | POA: Diagnosis not present

## 2019-09-30 DIAGNOSIS — M6281 Muscle weakness (generalized): Secondary | ICD-10-CM | POA: Diagnosis not present

## 2019-09-30 DIAGNOSIS — M519 Unspecified thoracic, thoracolumbar and lumbosacral intervertebral disc disorder: Secondary | ICD-10-CM | POA: Diagnosis not present

## 2019-09-30 DIAGNOSIS — H9190 Unspecified hearing loss, unspecified ear: Secondary | ICD-10-CM | POA: Diagnosis not present

## 2019-09-30 DIAGNOSIS — I10 Essential (primary) hypertension: Secondary | ICD-10-CM | POA: Diagnosis not present

## 2019-09-30 DIAGNOSIS — M810 Age-related osteoporosis without current pathological fracture: Secondary | ICD-10-CM | POA: Diagnosis not present

## 2019-09-30 DIAGNOSIS — J449 Chronic obstructive pulmonary disease, unspecified: Secondary | ICD-10-CM | POA: Diagnosis not present

## 2019-09-30 DIAGNOSIS — G479 Sleep disorder, unspecified: Secondary | ICD-10-CM | POA: Diagnosis not present

## 2019-10-01 ENCOUNTER — Emergency Department (HOSPITAL_COMMUNITY)
Admission: EM | Admit: 2019-10-01 | Discharge: 2019-10-01 | Disposition: A | Discharge status: Home / Self Care | Payer: Medicare Other | Attending: Emergency Medicine | Admitting: Emergency Medicine

## 2019-10-01 ENCOUNTER — Emergency Department (HOSPITAL_COMMUNITY): Payer: Medicare Other

## 2019-10-01 ENCOUNTER — Other Ambulatory Visit: Payer: Self-pay

## 2019-10-01 DIAGNOSIS — J449 Chronic obstructive pulmonary disease, unspecified: Secondary | ICD-10-CM | POA: Insufficient documentation

## 2019-10-01 DIAGNOSIS — Y939 Activity, unspecified: Secondary | ICD-10-CM | POA: Diagnosis not present

## 2019-10-01 DIAGNOSIS — W1830XA Fall on same level, unspecified, initial encounter: Secondary | ICD-10-CM | POA: Diagnosis not present

## 2019-10-01 DIAGNOSIS — M0579 Rheumatoid arthritis with rheumatoid factor of multiple sites without organ or systems involvement: Secondary | ICD-10-CM | POA: Diagnosis not present

## 2019-10-01 DIAGNOSIS — Y929 Unspecified place or not applicable: Secondary | ICD-10-CM | POA: Diagnosis not present

## 2019-10-01 DIAGNOSIS — G8929 Other chronic pain: Secondary | ICD-10-CM | POA: Insufficient documentation

## 2019-10-01 DIAGNOSIS — F410 Panic disorder [episodic paroxysmal anxiety] without agoraphobia: Secondary | ICD-10-CM | POA: Diagnosis not present

## 2019-10-01 DIAGNOSIS — Z87891 Personal history of nicotine dependence: Secondary | ICD-10-CM | POA: Insufficient documentation

## 2019-10-01 DIAGNOSIS — Y999 Unspecified external cause status: Secondary | ICD-10-CM | POA: Diagnosis not present

## 2019-10-01 DIAGNOSIS — I10 Essential (primary) hypertension: Secondary | ICD-10-CM | POA: Diagnosis not present

## 2019-10-01 DIAGNOSIS — M546 Pain in thoracic spine: Secondary | ICD-10-CM | POA: Diagnosis not present

## 2019-10-01 DIAGNOSIS — Z79899 Other long term (current) drug therapy: Secondary | ICD-10-CM | POA: Insufficient documentation

## 2019-10-01 DIAGNOSIS — J8410 Pulmonary fibrosis, unspecified: Secondary | ICD-10-CM | POA: Diagnosis not present

## 2019-10-01 DIAGNOSIS — E785 Hyperlipidemia, unspecified: Secondary | ICD-10-CM | POA: Diagnosis not present

## 2019-10-01 LAB — CBC WITH DIFFERENTIAL/PLATELET
Abs Immature Granulocytes: 0.04 10*3/uL (ref 0.00–0.07)
Basophils Absolute: 0.1 10*3/uL (ref 0.0–0.1)
Basophils Relative: 1 %
Eosinophils Absolute: 0.4 10*3/uL (ref 0.0–0.5)
Eosinophils Relative: 6 %
HCT: 43.5 % (ref 39.0–52.0)
Hemoglobin: 14.5 g/dL (ref 13.0–17.0)
Immature Granulocytes: 1 %
Lymphocytes Relative: 10 %
Lymphs Abs: 0.6 10*3/uL — ABNORMAL LOW (ref 0.7–4.0)
MCH: 37.3 pg — ABNORMAL HIGH (ref 26.0–34.0)
MCHC: 33.3 g/dL (ref 30.0–36.0)
MCV: 111.8 fL — ABNORMAL HIGH (ref 80.0–100.0)
Monocytes Absolute: 0.6 10*3/uL (ref 0.1–1.0)
Monocytes Relative: 9 %
Neutro Abs: 4.5 10*3/uL (ref 1.7–7.7)
Neutrophils Relative %: 73 %
Platelets: 127 10*3/uL — ABNORMAL LOW (ref 150–400)
RBC: 3.89 MIL/uL — ABNORMAL LOW (ref 4.22–5.81)
RDW: 13 % (ref 11.5–15.5)
WBC: 6.2 10*3/uL (ref 4.0–10.5)
nRBC: 0 % (ref 0.0–0.2)

## 2019-10-01 LAB — COMPREHENSIVE METABOLIC PANEL
ALT: 32 U/L (ref 0–44)
AST: 26 U/L (ref 15–41)
Albumin: 2.9 g/dL — ABNORMAL LOW (ref 3.5–5.0)
Alkaline Phosphatase: 52 U/L (ref 38–126)
Anion gap: 12 (ref 5–15)
BUN: 15 mg/dL (ref 8–23)
CO2: 21 mmol/L — ABNORMAL LOW (ref 22–32)
Calcium: 8.7 mg/dL — ABNORMAL LOW (ref 8.9–10.3)
Chloride: 104 mmol/L (ref 98–111)
Creatinine, Ser: 1.27 mg/dL — ABNORMAL HIGH (ref 0.61–1.24)
GFR calc Af Amer: >60 mL/min (ref >60)
GFR calc non Af Amer: 56 mL/min — ABNORMAL LOW (ref >60)
Glucose, Bld: 114 mg/dL — ABNORMAL HIGH (ref 70–99)
Potassium: 4.2 mmol/L (ref 3.5–5.1)
Sodium: 137 mmol/L (ref 135–145)
Total Bilirubin: 0.8 mg/dL (ref 0.3–1.2)
Total Protein: 7 g/dL (ref 6.5–8.1)

## 2019-10-01 LAB — URINALYSIS, ROUTINE W REFLEX MICROSCOPIC
Bilirubin Urine: NEGATIVE
Glucose, UA: NEGATIVE mg/dL
Hgb urine dipstick: NEGATIVE
Ketones, ur: NEGATIVE mg/dL
Leukocytes,Ua: NEGATIVE
Nitrite: NEGATIVE
Protein, ur: NEGATIVE mg/dL
Specific Gravity, Urine: 1.025 (ref 1.005–1.030)
pH: 5 (ref 5.0–8.0)

## 2019-10-01 MED ORDER — MORPHINE SULFATE (PF) 2 MG/ML IV SOLN
2.0000 mg | Freq: Once | INTRAVENOUS | Status: AC
  Administered 2019-10-01: 17:00:00 2 mg via INTRAMUSCULAR
  Filled 2019-10-01: qty 1

## 2019-10-01 MED ORDER — SODIUM CHLORIDE 0.9 % IV BOLUS
500.0000 mL | Freq: Once | INTRAVENOUS | Status: AC
  Administered 2019-10-01: 500 mL via INTRAVENOUS

## 2019-10-01 NOTE — ED Provider Notes (Addendum)
[facility] DEPT Provider Note   CSN: [id number] Arrival date & time: 10/01/19  1515     History Chief Complaint  Patient presents with  . Back Pain    Kenneth [name] is a 73 y.o. male.  The history is provided by the patient. No language interpreter was used.  Back Pain Location:  Thoracic spine Quality:  Aching Radiates to:  Does not radiate Pain severity:  Moderate Pain is:  Same all the time Timing:  Constant Progression:  Worsening Chronicity:  Recurrent Context: falling   Context: not recent illness   Relieved by:  Nothing Worsened by:  Nothing Ineffective treatments:  None tried Associated symptoms: weakness   Associated symptoms: no numbness   Pt reports he has pain in his midback.  Pt fell 2 days ago and has had some increased pain.  Pt reports he has not been eating or drinking normally      Past Medical History:  Diagnosis Date  . Abnormal LFTs   . Anxiety   . COPD (chronic obstructive pulmonary disease) (HCC)    emphysema.  . CTS (carpal tunnel syndrome)    Left  . DDD (degenerative disc disease)   . Depression    mild  . Elevated MCV   . Emphysema of lung ([place])   . Gout   . Gynecomastia   . Hearing loss   . History of radiation therapy 10/17/17-10/24/17   left lung treated to 54 Gy in 3 fractions  . Hyperlipidemia   . Microhematuria   . Nephrolithiasis   . Ocular migraine   . Panic attack   . Rheumatoid arthritis(714.0) dx 2000  . Situational hypertension   . Tobacco abuse     Patient Active Problem List   Diagnosis Date Noted  . Malnutrition of moderate degree 06/11/2018  . Pancytopenia ([place]) 06/09/2018  . HLD (hyperlipidemia) 06/09/2018  . Rheumatoid arthritis ([place]) 06/09/2018  . Cellulitis of right leg 06/09/2018  . Anemia due to folic acid deficiency 94/17/4081  . Non-small cell cancer of left lung ([place]) 09/05/2017  . Lung nodule 08/01/2017  . Protein-calorie malnutrition, severe 04/30/2016  .  Esophagitis 04/29/2016  . Dysphagia 04/29/2016  . Odynophagia   . Abnormal loss of weight   . Emphysema 09/28/2011  . Interstitial fibrosis 09/28/2011    Past Surgical History:  Procedure Laterality Date  . APPENDECTOMY    . COLONOSCOPY    . CYSTOSTOMY W/ BLADDER BIOPSY  2006  . ESOPHAGOGASTRODUODENOSCOPY N/A 04/30/2016   Procedure: ESOPHAGOGASTRODUODENOSCOPY (EGD);  Surgeon: Doran Stabler, MD;  Location: [facility];  Service: Endoscopy;  Laterality: N/A;       Family History  Problem Relation Age of Onset  . Lung cancer Father   . Asthma Paternal Grandmother   . Lung cancer Paternal Aunt   . Breast cancer Maternal Aunt   . Breast cancer Maternal Aunt   . Stomach cancer Maternal Uncle   . Colon cancer Neg Hx     Social History   Tobacco Use  . Smoking status: Former Smoker    Packs/day: 0.50    Years: 44.00    Pack years: 22.00    Types: Cigarettes    Quit date: 12/05/2017    Years since quitting: 1.8  . Smokeless tobacco: Never Used  . Tobacco comment: "quit cold [place]"  Substance Use Topics  . Alcohol use: No  . Drug use: No    Home Medications Prior to Admission medications   Medication  Sig Start Date End Date Taking? Authorizing Provider  acetaminophen (TYLENOL) 325 MG tablet Take 325 mg by mouth every 4 (four) hours as needed for mild pain.    Yes [provider]  albuterol (VENTOLIN HFA) 108 (90 Base) MCG/ACT inhaler Inhale 1-2 puffs into the lungs every 6 (six) hours as needed for wheezing or shortness of breath.   Yes [provider]  amLODipine (NORVASC) 5 MG tablet Take 5 mg by mouth daily.  08/06/17  Yes [provider]  Certolizumab Pegol (CIMZIA) 2 X 200 MG KIT Inject 1 each into the skin every 28 (twenty-eight) days.   Yes [provider]  cholecalciferol (VITAMIN D) 1000 UNITS tablet Take 1,000 Units by mouth daily.     Yes [provider]  clonazePAM (KLONOPIN) 0.5 MG tablet Take 0.5 mg by mouth 2  (two) times daily as needed for anxiety.    Yes [provider]  docusate sodium (COLACE) 100 MG capsule Take 100 mg by mouth daily.   Yes [provider]  Fluticasone-Umeclidin-Vilant (TRELEGY ELLIPTA) 100-62.5-25 MCG/INH AEPB Inhale 1 puff into the lungs daily.    Yes [provider]  folic acid (FOLVITE) 1 MG tablet Take 5 tablets (5 mg total) by mouth daily. Patient taking differently: Take 6 mg by mouth daily.  06/16/18  Yes Aline August, MD  HYDROcodone-acetaminophen (NORCO/VICODIN) 5-325 MG tablet Take 1 tablet every 6 (six) hours as needed by mouth for moderate pain.   Yes [provider]  potassium chloride SA (KLOR-CON) 20 MEQ tablet Take 20 mEq by mouth daily.   Yes [provider]  predniSONE (DELTASONE) 1 MG tablet Take 2.5 mg by mouth daily. 05/18/18  Yes [provider]  rosuvastatin (CRESTOR) 20 MG tablet Take 10 mg every evening by mouth.    Yes [provider]  valACYclovir (VALTREX) 500 MG tablet Take 500 mg by mouth daily.    Yes [provider]  venlafaxine XR (EFFEXOR-XR) 37.5 MG 24 hr capsule Take 37.5 mg by mouth at bedtime. 08/07/19  Yes [provider]  vitamin B-12 1000 MCG tablet Take 1 tablet (1,000 mcg total) by mouth daily. 06/16/18  Yes Aline August, MD  zoledronic acid (RECLAST) 5 MG/100ML SOLN Inject 5 mg into the vein once. Every 2 years    Yes [provider]    Allergies    Other, Aspirin, and Naproxen sodium  Review of Systems   Review of Systems  Musculoskeletal: Positive for back pain.  Neurological: Positive for weakness. Negative for numbness.  All other systems reviewed and are negative.   Physical Exam Updated Vital Signs BP 119/67   Pulse 63   Temp 98.3 F (36.8 C) (Oral)   Resp (!) 25   Ht _0  (1.753 m)   Wt 72.6 kg   SpO2 99%   BMI 23.63 kg/m   Physical Exam Vitals and nursing note reviewed.  Constitutional:      Appearance: He is  well-developed.  HENT:     Head: Normocephalic and atraumatic.  Eyes:     Conjunctiva/sclera: Conjunctivae normal.  Cardiovascular:     Rate and Rhythm: Normal rate and regular rhythm.     Heart sounds: No murmur.  Pulmonary:     Effort: Pulmonary effort is normal. No respiratory distress.     Breath sounds: Normal breath sounds.  Abdominal:     Palpations: Abdomen is soft.     Tenderness: There is no abdominal tenderness.  Musculoskeletal:  General: Normal range of motion.     Cervical back: Neck supple.  Skin:    General: Skin is warm and dry.  Neurological:     General: No focal deficit present.     Mental Status: He is alert.  Psychiatric:        Mood and Affect: Mood normal.     ED Results / Procedures / Treatments   Labs (all labs ordered are listed, but only abnormal results are displayed) Labs Reviewed  CBC WITH DIFFERENTIAL/PLATELET - Abnormal; Notable for the following components:      Result Value   RBC 3.89 (*)    MCV 111.8 (*)    MCH 37.3 (*)    Platelets 127 (*)    Lymphs Abs 0.6 (*)    All other components within normal limits  COMPREHENSIVE METABOLIC PANEL - Abnormal; Notable for the following components:   CO2 21 (*)    Glucose, Bld 114 (*)    Creatinine, Ser 1.27 (*)    Calcium 8.7 (*)    Albumin 2.9 (*)    GFR calc non Af Amer 56 (*)    All other components within normal limits  URINALYSIS, ROUTINE W REFLEX MICROSCOPIC    EKG None  Radiology CXR PA/Lat  Result Date: 10/01/2019 CLINICAL DATA:  [place].  Vomiting. EXAM: CHEST - 2 VIEW COMPARISON:  Chest x-ray 08/29/2019 and chest CT 09/07/2019 FINDINGS: The cardiac silhouette, mediastinal and hilar contours are within normal limits and stable. Stable tortuosity and calcification of the thoracic aorta. Severe chronic lung disease with emphysema and pulmonary scarring. No definite acute overlying pulmonary process. No pleural effusions. The bony thorax is grossly intact. IMPRESSION: Severe  chronic lung disease with pulmonary scarring but no definite acute overlying pulmonary process. Electronically Signed   By: Marijo Sanes M.D.   On: 10/01/2019 17:02   DG Thoracic Spine 2 View  Result Date: 10/01/2019 CLINICAL DATA:  Chronic back pain EXAM: THORACIC SPINE 2 VIEWS COMPARISON:  None. FINDINGS: Normal alignment. Negative for fracture or mass. Minimal degenerative changes in the thoracic spine. Bibasilar airspace disease may represent fibrosis based on prior imaging. IMPRESSION: 1. No acute abnormality in the thoracic spine. 2. Bibasilar airspace disease may represent fibrosis. Electronically Signed   By: Franchot Gallo M.D.   On: 10/01/2019 17:03    Procedures Procedures (including critical care time)  Medications Ordered in ED Medications  morphine 2 MG/ML injection 2 mg (2 mg Intramuscular Given 10/01/19 1700)  sodium chloride 0.9 % bolus 500 mL (500 mLs Intravenous New Bag/Given 10/01/19 1933)    ED Course  I have reviewed the triage vital signs and the nursing notes.  Pertinent labs & imaging results that were available during my care of the patient were reviewed by me and considered in my medical decision making (see chart for details).    MDM Rules/Calculators/A&P                      MDM  Chest xray and thoracic spine show no acute abnormality.  Pt reports feeling better   Final Clinical Impression(s) / ED Diagnoses Final diagnoses:  Chronic thoracic back pain, unspecified back pain laterality    Rx / DC Orders ED Discharge Orders    None    An After Visit Summary was printed and given to the patient.    Fransico Meadow, PA-C 10/01/19 2023    Fransico Meadow, PA-C 10/01/19 2223    Valarie Merino,  MD 10/01/19 2316

## 2019-10-01 NOTE — ED Notes (Signed)
Pt given water and graham crackers, tolerating both well

## 2019-10-01 NOTE — ED Triage Notes (Signed)
Per EMS, patient has hx of chronic back pain with l3 and l4 bulging discs, taking hyrdocodone. On 09/29/19 patient fell and landed on his bottom. Back took pressure of fall. Since fall, patient not wanting to sit up or move due to increasing back pain. EMS reports patient also has right side flank pain. Patient rates pain as 10/10

## 2019-10-01 NOTE — Discharge Instructions (Signed)
Return if any problems.

## 2019-10-03 DIAGNOSIS — M0579 Rheumatoid arthritis with rheumatoid factor of multiple sites without organ or systems involvement: Secondary | ICD-10-CM | POA: Diagnosis not present

## 2019-10-03 DIAGNOSIS — F410 Panic disorder [episodic paroxysmal anxiety] without agoraphobia: Secondary | ICD-10-CM | POA: Diagnosis not present

## 2019-10-03 DIAGNOSIS — J8410 Pulmonary fibrosis, unspecified: Secondary | ICD-10-CM | POA: Diagnosis not present

## 2019-10-03 DIAGNOSIS — J449 Chronic obstructive pulmonary disease, unspecified: Secondary | ICD-10-CM | POA: Diagnosis not present

## 2019-10-03 DIAGNOSIS — I10 Essential (primary) hypertension: Secondary | ICD-10-CM | POA: Diagnosis not present

## 2019-10-03 DIAGNOSIS — E785 Hyperlipidemia, unspecified: Secondary | ICD-10-CM | POA: Diagnosis not present

## 2019-10-04 DIAGNOSIS — E785 Hyperlipidemia, unspecified: Secondary | ICD-10-CM | POA: Diagnosis not present

## 2019-10-04 DIAGNOSIS — J8410 Pulmonary fibrosis, unspecified: Secondary | ICD-10-CM | POA: Diagnosis not present

## 2019-10-04 DIAGNOSIS — I10 Essential (primary) hypertension: Secondary | ICD-10-CM | POA: Diagnosis not present

## 2019-10-04 DIAGNOSIS — F410 Panic disorder [episodic paroxysmal anxiety] without agoraphobia: Secondary | ICD-10-CM | POA: Diagnosis not present

## 2019-10-04 DIAGNOSIS — M0579 Rheumatoid arthritis with rheumatoid factor of multiple sites without organ or systems involvement: Secondary | ICD-10-CM | POA: Diagnosis not present

## 2019-10-04 DIAGNOSIS — J449 Chronic obstructive pulmonary disease, unspecified: Secondary | ICD-10-CM | POA: Diagnosis not present

## 2019-10-05 DIAGNOSIS — J8410 Pulmonary fibrosis, unspecified: Secondary | ICD-10-CM | POA: Diagnosis not present

## 2019-10-05 DIAGNOSIS — F410 Panic disorder [episodic paroxysmal anxiety] without agoraphobia: Secondary | ICD-10-CM | POA: Diagnosis not present

## 2019-10-05 DIAGNOSIS — I10 Essential (primary) hypertension: Secondary | ICD-10-CM | POA: Diagnosis not present

## 2019-10-05 DIAGNOSIS — E785 Hyperlipidemia, unspecified: Secondary | ICD-10-CM | POA: Diagnosis not present

## 2019-10-05 DIAGNOSIS — M0579 Rheumatoid arthritis with rheumatoid factor of multiple sites without organ or systems involvement: Secondary | ICD-10-CM | POA: Diagnosis not present

## 2019-10-05 DIAGNOSIS — J449 Chronic obstructive pulmonary disease, unspecified: Secondary | ICD-10-CM | POA: Diagnosis not present

## 2019-10-09 DIAGNOSIS — F410 Panic disorder [episodic paroxysmal anxiety] without agoraphobia: Secondary | ICD-10-CM | POA: Diagnosis not present

## 2019-10-09 DIAGNOSIS — M0579 Rheumatoid arthritis with rheumatoid factor of multiple sites without organ or systems involvement: Secondary | ICD-10-CM | POA: Diagnosis not present

## 2019-10-09 DIAGNOSIS — E785 Hyperlipidemia, unspecified: Secondary | ICD-10-CM | POA: Diagnosis not present

## 2019-10-09 DIAGNOSIS — J449 Chronic obstructive pulmonary disease, unspecified: Secondary | ICD-10-CM | POA: Diagnosis not present

## 2019-10-09 DIAGNOSIS — I10 Essential (primary) hypertension: Secondary | ICD-10-CM | POA: Diagnosis not present

## 2019-10-09 DIAGNOSIS — J8410 Pulmonary fibrosis, unspecified: Secondary | ICD-10-CM | POA: Diagnosis not present

## 2019-10-10 DIAGNOSIS — E785 Hyperlipidemia, unspecified: Secondary | ICD-10-CM | POA: Diagnosis not present

## 2019-10-10 DIAGNOSIS — F410 Panic disorder [episodic paroxysmal anxiety] without agoraphobia: Secondary | ICD-10-CM | POA: Diagnosis not present

## 2019-10-10 DIAGNOSIS — M0579 Rheumatoid arthritis with rheumatoid factor of multiple sites without organ or systems involvement: Secondary | ICD-10-CM | POA: Diagnosis not present

## 2019-10-10 DIAGNOSIS — J8410 Pulmonary fibrosis, unspecified: Secondary | ICD-10-CM | POA: Diagnosis not present

## 2019-10-10 DIAGNOSIS — J449 Chronic obstructive pulmonary disease, unspecified: Secondary | ICD-10-CM | POA: Diagnosis not present

## 2019-10-10 DIAGNOSIS — I10 Essential (primary) hypertension: Secondary | ICD-10-CM | POA: Diagnosis not present

## 2019-10-15 DIAGNOSIS — F410 Panic disorder [episodic paroxysmal anxiety] without agoraphobia: Secondary | ICD-10-CM | POA: Diagnosis not present

## 2019-10-15 DIAGNOSIS — M0579 Rheumatoid arthritis with rheumatoid factor of multiple sites without organ or systems involvement: Secondary | ICD-10-CM | POA: Diagnosis not present

## 2019-10-15 DIAGNOSIS — E785 Hyperlipidemia, unspecified: Secondary | ICD-10-CM | POA: Diagnosis not present

## 2019-10-15 DIAGNOSIS — I10 Essential (primary) hypertension: Secondary | ICD-10-CM | POA: Diagnosis not present

## 2019-10-15 DIAGNOSIS — J449 Chronic obstructive pulmonary disease, unspecified: Secondary | ICD-10-CM | POA: Diagnosis not present

## 2019-10-15 DIAGNOSIS — J8410 Pulmonary fibrosis, unspecified: Secondary | ICD-10-CM | POA: Diagnosis not present

## 2019-10-16 ENCOUNTER — Telehealth: Payer: Self-pay

## 2019-10-16 ENCOUNTER — Telehealth: Payer: Self-pay | Admitting: *Deleted

## 2019-10-16 DIAGNOSIS — J449 Chronic obstructive pulmonary disease, unspecified: Secondary | ICD-10-CM | POA: Diagnosis not present

## 2019-10-16 DIAGNOSIS — E785 Hyperlipidemia, unspecified: Secondary | ICD-10-CM | POA: Diagnosis not present

## 2019-10-16 DIAGNOSIS — J8410 Pulmonary fibrosis, unspecified: Secondary | ICD-10-CM | POA: Diagnosis not present

## 2019-10-16 DIAGNOSIS — I10 Essential (primary) hypertension: Secondary | ICD-10-CM | POA: Diagnosis not present

## 2019-10-16 DIAGNOSIS — M0579 Rheumatoid arthritis with rheumatoid factor of multiple sites without organ or systems involvement: Secondary | ICD-10-CM | POA: Diagnosis not present

## 2019-10-16 DIAGNOSIS — R531 Weakness: Secondary | ICD-10-CM | POA: Diagnosis not present

## 2019-10-16 DIAGNOSIS — F410 Panic disorder [episodic paroxysmal anxiety] without agoraphobia: Secondary | ICD-10-CM | POA: Diagnosis not present

## 2019-10-16 NOTE — Telephone Encounter (Signed)
Received phone call from Anice Paganini, Sw with [facility] relayed an order for new Palliative Care referral from Dr. Joylene Draft. Olivia Mackie shared that patient has a dx of Rheumatoid arthritis.

## 2019-10-16 NOTE — Telephone Encounter (Signed)
Palliative care referral received today. I contacted and spoke with patient's wife, Kenneth [name], to arrange a home visit. Visit scheduled for tomorrow, Wednesday 10/17/19 at 2:30 pm.

## 2019-10-17 ENCOUNTER — Other Ambulatory Visit: Payer: Self-pay

## 2019-10-17 ENCOUNTER — Other Ambulatory Visit: Payer: Medicare Other | Admitting: *Deleted

## 2019-10-17 DIAGNOSIS — Z515 Encounter for palliative care: Secondary | ICD-10-CM

## 2019-10-17 DIAGNOSIS — J8410 Pulmonary fibrosis, unspecified: Secondary | ICD-10-CM | POA: Diagnosis not present

## 2019-10-17 DIAGNOSIS — J449 Chronic obstructive pulmonary disease, unspecified: Secondary | ICD-10-CM | POA: Diagnosis not present

## 2019-10-17 DIAGNOSIS — I10 Essential (primary) hypertension: Secondary | ICD-10-CM | POA: Diagnosis not present

## 2019-10-17 DIAGNOSIS — F410 Panic disorder [episodic paroxysmal anxiety] without agoraphobia: Secondary | ICD-10-CM | POA: Diagnosis not present

## 2019-10-17 DIAGNOSIS — E785 Hyperlipidemia, unspecified: Secondary | ICD-10-CM | POA: Diagnosis not present

## 2019-10-17 DIAGNOSIS — M0579 Rheumatoid arthritis with rheumatoid factor of multiple sites without organ or systems involvement: Secondary | ICD-10-CM | POA: Diagnosis not present

## 2019-10-18 DIAGNOSIS — E785 Hyperlipidemia, unspecified: Secondary | ICD-10-CM | POA: Diagnosis not present

## 2019-10-18 DIAGNOSIS — J8410 Pulmonary fibrosis, unspecified: Secondary | ICD-10-CM | POA: Diagnosis not present

## 2019-10-18 DIAGNOSIS — M0579 Rheumatoid arthritis with rheumatoid factor of multiple sites without organ or systems involvement: Secondary | ICD-10-CM | POA: Diagnosis not present

## 2019-10-18 DIAGNOSIS — J449 Chronic obstructive pulmonary disease, unspecified: Secondary | ICD-10-CM | POA: Diagnosis not present

## 2019-10-18 DIAGNOSIS — F410 Panic disorder [episodic paroxysmal anxiety] without agoraphobia: Secondary | ICD-10-CM | POA: Diagnosis not present

## 2019-10-18 DIAGNOSIS — I10 Essential (primary) hypertension: Secondary | ICD-10-CM | POA: Diagnosis not present

## 2019-10-19 DIAGNOSIS — F410 Panic disorder [episodic paroxysmal anxiety] without agoraphobia: Secondary | ICD-10-CM | POA: Diagnosis not present

## 2019-10-19 DIAGNOSIS — J8410 Pulmonary fibrosis, unspecified: Secondary | ICD-10-CM | POA: Diagnosis not present

## 2019-10-19 DIAGNOSIS — E785 Hyperlipidemia, unspecified: Secondary | ICD-10-CM | POA: Diagnosis not present

## 2019-10-19 DIAGNOSIS — J449 Chronic obstructive pulmonary disease, unspecified: Secondary | ICD-10-CM | POA: Diagnosis not present

## 2019-10-19 DIAGNOSIS — M0579 Rheumatoid arthritis with rheumatoid factor of multiple sites without organ or systems involvement: Secondary | ICD-10-CM | POA: Diagnosis not present

## 2019-10-19 DIAGNOSIS — I10 Essential (primary) hypertension: Secondary | ICD-10-CM | POA: Diagnosis not present

## 2019-10-22 DIAGNOSIS — F410 Panic disorder [episodic paroxysmal anxiety] without agoraphobia: Secondary | ICD-10-CM | POA: Diagnosis not present

## 2019-10-22 DIAGNOSIS — J8410 Pulmonary fibrosis, unspecified: Secondary | ICD-10-CM | POA: Diagnosis not present

## 2019-10-22 DIAGNOSIS — J449 Chronic obstructive pulmonary disease, unspecified: Secondary | ICD-10-CM | POA: Diagnosis not present

## 2019-10-22 DIAGNOSIS — I10 Essential (primary) hypertension: Secondary | ICD-10-CM | POA: Diagnosis not present

## 2019-10-22 DIAGNOSIS — M0579 Rheumatoid arthritis with rheumatoid factor of multiple sites without organ or systems involvement: Secondary | ICD-10-CM | POA: Diagnosis not present

## 2019-10-22 DIAGNOSIS — E785 Hyperlipidemia, unspecified: Secondary | ICD-10-CM | POA: Diagnosis not present

## 2019-10-22 NOTE — Progress Notes (Signed)
COMMUNITY PALLIATIVE CARE RN NOTE  PATIENT NAME: Kenneth [name] DOB: 06-Apr-1946 MRN: [id number]  PRIMARY CARE PROVIDER: [name], MD  RESPONSIBLE PARTY:  Acct ID - Guarantor Home Phone Work Phone Relationship Acct Type  [id number] - Kehl,Ettore* [phone]  Self P/F     [address], [place], [place] [address]   Covid-19 Pre-screening Negative  PLAN OF CARE and INTERVENTION:  1. ADVANCE CARE PLANNING/GOALS OF CARE: Goal is for patient to remain in his home. He requests a DNR form. 2. PATIENT/CAREGIVER EDUCATION: Explained Palliative Care Services 3. DISEASE STATUS: Met with patient, his wife and son in their home. Spoke with wife and son initially to receive information regarding his health history, then visited with patient afterwards. Family states that patient has seemed to decline rapidly over the past few weeks. He spends most of his time lying in bed and does not usually want to get up to his recliner. He is currently working with PT/OT through [facility]. The only time he gets up is when the therapist is present. Wife states that when he c/o pain it is usually in his hips, back, hands and feet. His RA is crippling his hands and they are contracting. Patient denies pain at this time and feels that the Hydrocodone is controlling his pain well. He takes it scheduled 3 times a day along with his Clonazepam. Wife reports that he had 4 falls on 12/14, but since then has become progressively weaker and is only now able to transfer with assistance to his wheelchair using a sliding board. He is no longer able to bear weight. He also requires assistance with bathing and dressing. He can assist with repositioning himself. They assist him with feeding. He has a hospital bed and a bedside commode. He uses a urinal so is continent of both bowel and bladder. Family feels that he is tired and may have given up. His intake is variable. Wife states that if patient eats on day, he will more than  likely not eat the next day. Today for breakfast he ate several bites of eggs and about 2 bites of his toast. He does not like Boost or Ensure for nutritional supplementation. He denies dysphagia or nausea. He takes his medications whole with water without difficulty. He does have issues with constipation. He has gone in the past 5-7 days without a BM. Last BM 3 days ago. Son states Miralax does help and he is planning on giving him a dose today. He has Stage 1 pressure injury to the outer aspect of his right ankle. His legs are elevated up on pillows. He had labs drawn yesterday. His wife states that patient had a CT scan in November, and it showed something in the right lung. Patient does have a history of L lung cancer, but is currently in remission per wife. This will be re-evaluated in next month. Provided education regarding hospice services and wife knows that patient may not improve. He has an OT assessment tomorrow. Wife is unsure how much longer patient will be receiving therapy. Will continue to monitor.  HISTORY OF PRESENT ILLNESS:  This is a 74 yo male who resides at home with his wife. Palliative care team was asked to follow patient for additional support and transfer to hospice services when ready. Will visit patient at least bi-weekly and PRN.  CODE STATUS: DNR (will mail form to home) ADVANCED DIRECTIVES: Y MOST FORM: no PPS: 30%   PHYSICAL EXAM:   LUNGS: clear to  auscultation  CARDIAC: Cor RRR EXTREMITIES: No edema SKIN: Skin is dry and intact, Stage I pressure injury to outer aspect of R ankle  NEURO: Alert and oriented x 3, pleasant mood, progressive generalized weakness, requires assistance with all ADLs   (Duration of visit and documentation 90 minutes)   Daryl Eastern, RN BSN

## 2019-10-23 DIAGNOSIS — F329 Major depressive disorder, single episode, unspecified: Secondary | ICD-10-CM | POA: Diagnosis not present

## 2019-10-23 DIAGNOSIS — M069 Rheumatoid arthritis, unspecified: Secondary | ICD-10-CM | POA: Diagnosis not present

## 2019-10-23 DIAGNOSIS — M810 Age-related osteoporosis without current pathological fracture: Secondary | ICD-10-CM | POA: Diagnosis not present

## 2019-10-23 DIAGNOSIS — I10 Essential (primary) hypertension: Secondary | ICD-10-CM | POA: Diagnosis not present

## 2019-10-23 DIAGNOSIS — C3432 Malignant neoplasm of lower lobe, left bronchus or lung: Secondary | ICD-10-CM | POA: Diagnosis not present

## 2019-10-23 DIAGNOSIS — F17200 Nicotine dependence, unspecified, uncomplicated: Secondary | ICD-10-CM | POA: Diagnosis not present

## 2019-10-23 DIAGNOSIS — F410 Panic disorder [episodic paroxysmal anxiety] without agoraphobia: Secondary | ICD-10-CM | POA: Diagnosis not present

## 2019-10-23 DIAGNOSIS — M0579 Rheumatoid arthritis with rheumatoid factor of multiple sites without organ or systems involvement: Secondary | ICD-10-CM | POA: Diagnosis not present

## 2019-10-23 DIAGNOSIS — R627 Adult failure to thrive: Secondary | ICD-10-CM | POA: Diagnosis not present

## 2019-10-23 DIAGNOSIS — E46 Unspecified protein-calorie malnutrition: Secondary | ICD-10-CM | POA: Diagnosis not present

## 2019-10-23 DIAGNOSIS — J449 Chronic obstructive pulmonary disease, unspecified: Secondary | ICD-10-CM | POA: Diagnosis not present

## 2019-10-23 DIAGNOSIS — Z0000 Encounter for general adult medical examination without abnormal findings: Secondary | ICD-10-CM | POA: Diagnosis not present

## 2019-10-23 DIAGNOSIS — F419 Anxiety disorder, unspecified: Secondary | ICD-10-CM | POA: Diagnosis not present

## 2019-10-23 DIAGNOSIS — J8410 Pulmonary fibrosis, unspecified: Secondary | ICD-10-CM | POA: Diagnosis not present

## 2019-10-23 DIAGNOSIS — E785 Hyperlipidemia, unspecified: Secondary | ICD-10-CM | POA: Diagnosis not present

## 2019-10-24 DIAGNOSIS — E785 Hyperlipidemia, unspecified: Secondary | ICD-10-CM | POA: Diagnosis not present

## 2019-10-24 DIAGNOSIS — I10 Essential (primary) hypertension: Secondary | ICD-10-CM | POA: Diagnosis not present

## 2019-10-24 DIAGNOSIS — F410 Panic disorder [episodic paroxysmal anxiety] without agoraphobia: Secondary | ICD-10-CM | POA: Diagnosis not present

## 2019-10-24 DIAGNOSIS — J449 Chronic obstructive pulmonary disease, unspecified: Secondary | ICD-10-CM | POA: Diagnosis not present

## 2019-10-24 DIAGNOSIS — J8410 Pulmonary fibrosis, unspecified: Secondary | ICD-10-CM | POA: Diagnosis not present

## 2019-10-24 DIAGNOSIS — M0579 Rheumatoid arthritis with rheumatoid factor of multiple sites without organ or systems involvement: Secondary | ICD-10-CM | POA: Diagnosis not present

## 2019-10-25 DIAGNOSIS — E785 Hyperlipidemia, unspecified: Secondary | ICD-10-CM | POA: Diagnosis not present

## 2019-10-25 DIAGNOSIS — I10 Essential (primary) hypertension: Secondary | ICD-10-CM | POA: Diagnosis not present

## 2019-10-25 DIAGNOSIS — J8410 Pulmonary fibrosis, unspecified: Secondary | ICD-10-CM | POA: Diagnosis not present

## 2019-10-25 DIAGNOSIS — J449 Chronic obstructive pulmonary disease, unspecified: Secondary | ICD-10-CM | POA: Diagnosis not present

## 2019-10-25 DIAGNOSIS — F410 Panic disorder [episodic paroxysmal anxiety] without agoraphobia: Secondary | ICD-10-CM | POA: Diagnosis not present

## 2019-10-25 DIAGNOSIS — M0579 Rheumatoid arthritis with rheumatoid factor of multiple sites without organ or systems involvement: Secondary | ICD-10-CM | POA: Diagnosis not present

## 2019-10-26 DIAGNOSIS — M0579 Rheumatoid arthritis with rheumatoid factor of multiple sites without organ or systems involvement: Secondary | ICD-10-CM | POA: Diagnosis not present

## 2019-10-26 DIAGNOSIS — I10 Essential (primary) hypertension: Secondary | ICD-10-CM | POA: Diagnosis not present

## 2019-10-26 DIAGNOSIS — J449 Chronic obstructive pulmonary disease, unspecified: Secondary | ICD-10-CM | POA: Diagnosis not present

## 2019-10-26 DIAGNOSIS — J8410 Pulmonary fibrosis, unspecified: Secondary | ICD-10-CM | POA: Diagnosis not present

## 2019-10-26 DIAGNOSIS — F410 Panic disorder [episodic paroxysmal anxiety] without agoraphobia: Secondary | ICD-10-CM | POA: Diagnosis not present

## 2019-10-26 DIAGNOSIS — E785 Hyperlipidemia, unspecified: Secondary | ICD-10-CM | POA: Diagnosis not present

## 2019-10-29 ENCOUNTER — Other Ambulatory Visit: Payer: Self-pay

## 2019-10-29 ENCOUNTER — Other Ambulatory Visit: Payer: Medicare Other | Admitting: Licensed Clinical Social Worker

## 2019-10-29 DIAGNOSIS — Z515 Encounter for palliative care: Secondary | ICD-10-CM

## 2019-10-29 NOTE — Progress Notes (Signed)
COMMUNITY PALLIATIVE CARE SW NOTE  PATIENT NAME: Kenneth [name] DOB: 01/06/1946 MRN: [id number]  PRIMARY CARE PROVIDER: [name], MD  RESPONSIBLE PARTY:  Acct ID - Guarantor Home Phone Work Phone Relationship Acct Type  [id number] - [name],[phone]  Self P/F     [address], [place], [place] [address]   Due to the COVID-19 crisis, this virtual check-in visit was done via telephone from my office and it was initiated and consent given by thispatient.   PLAN OF CARE and INTERVENTIONS:             1. GOALS OF CARE/ ADVANCE CARE PLANNING:  Goal is for patient to remain in his home.  He has a DNR. 2. SOCIAL/EMOTIONAL/SPIRITUAL ASSESSMENT/ INTERVENTIONS:  SW conducted a Sales executive visit with patient's wife, Kenneth [name].  She stated patient has improved since last week.  He is sleeping more.  He ate a good breakfast today, but will not eat anything else unless coaxed.  He had some pain issues this weekend, but was relieved with pain medicine.  SW provided active listening and supportive counseling.  A home visit is scheduled 1/19, at 1pm, per [facility] request. 3. PATIENT/CAREGIVER EDUCATION/ COPING:  Kenneth [name] copes by problem-solving. 4. PERSONAL EMERGENCY PLAN:  Kenneth [name] will contact EMS. 5. COMMUNITY RESOURCES COORDINATION/ HEALTH CARE NAVIGATION:  [facility]  RN and PT. 6. FINANCIAL/LEGAL CONCERNS/INTERVENTIONS:  None.     SOCIAL HX:  Social History   Tobacco Use  . Smoking status: Former Smoker    Packs/day: 0.50    Years: 44.00    Pack years: 22.00    Types: Cigarettes    Quit date: 12/05/2017    Years since quitting: 1.8  . Smokeless tobacco: Never Used  . Tobacco comment: "quit cold [place]"  Substance Use Topics  . Alcohol use: No    CODE STATUS:  DNR ADVANCED DIRECTIVES: No MOST FORM COMPLETE:  No HOSPICE EDUCATION PROVIDED: No PPS:  Patient cannot bear weight.  His appetite has decreased. Duration of visit and documentation:  30 minutes.      Creola Corn  Tiaja Hagan, LCSW

## 2019-10-30 DIAGNOSIS — M519 Unspecified thoracic, thoracolumbar and lumbosacral intervertebral disc disorder: Secondary | ICD-10-CM | POA: Diagnosis not present

## 2019-10-30 DIAGNOSIS — H9190 Unspecified hearing loss, unspecified ear: Secondary | ICD-10-CM | POA: Diagnosis not present

## 2019-10-30 DIAGNOSIS — E876 Hypokalemia: Secondary | ICD-10-CM | POA: Diagnosis not present

## 2019-10-30 DIAGNOSIS — I10 Essential (primary) hypertension: Secondary | ICD-10-CM | POA: Diagnosis not present

## 2019-10-30 DIAGNOSIS — Z79891 Long term (current) use of opiate analgesic: Secondary | ICD-10-CM | POA: Diagnosis not present

## 2019-10-30 DIAGNOSIS — C3432 Malignant neoplasm of lower lobe, left bronchus or lung: Secondary | ICD-10-CM | POA: Diagnosis not present

## 2019-10-30 DIAGNOSIS — Z87891 Personal history of nicotine dependence: Secondary | ICD-10-CM | POA: Diagnosis not present

## 2019-10-30 DIAGNOSIS — J449 Chronic obstructive pulmonary disease, unspecified: Secondary | ICD-10-CM | POA: Diagnosis not present

## 2019-10-30 DIAGNOSIS — M810 Age-related osteoporosis without current pathological fracture: Secondary | ICD-10-CM | POA: Diagnosis not present

## 2019-10-30 DIAGNOSIS — Z7951 Long term (current) use of inhaled steroids: Secondary | ICD-10-CM | POA: Diagnosis not present

## 2019-10-30 DIAGNOSIS — G479 Sleep disorder, unspecified: Secondary | ICD-10-CM | POA: Diagnosis not present

## 2019-10-30 DIAGNOSIS — M5136 Other intervertebral disc degeneration, lumbar region: Secondary | ICD-10-CM | POA: Diagnosis not present

## 2019-10-30 DIAGNOSIS — G43809 Other migraine, not intractable, without status migrainosus: Secondary | ICD-10-CM | POA: Diagnosis not present

## 2019-10-30 DIAGNOSIS — R627 Adult failure to thrive: Secondary | ICD-10-CM | POA: Diagnosis not present

## 2019-10-30 DIAGNOSIS — G8929 Other chronic pain: Secondary | ICD-10-CM | POA: Diagnosis not present

## 2019-10-30 DIAGNOSIS — M0579 Rheumatoid arthritis with rheumatoid factor of multiple sites without organ or systems involvement: Secondary | ICD-10-CM | POA: Diagnosis not present

## 2019-10-30 DIAGNOSIS — F410 Panic disorder [episodic paroxysmal anxiety] without agoraphobia: Secondary | ICD-10-CM | POA: Diagnosis not present

## 2019-10-30 DIAGNOSIS — Z9181 History of falling: Secondary | ICD-10-CM | POA: Diagnosis not present

## 2019-10-30 DIAGNOSIS — M109 Gout, unspecified: Secondary | ICD-10-CM | POA: Diagnosis not present

## 2019-10-30 DIAGNOSIS — Z86718 Personal history of other venous thrombosis and embolism: Secondary | ICD-10-CM | POA: Diagnosis not present

## 2019-10-30 DIAGNOSIS — M6281 Muscle weakness (generalized): Secondary | ICD-10-CM | POA: Diagnosis not present

## 2019-10-30 DIAGNOSIS — F329 Major depressive disorder, single episode, unspecified: Secondary | ICD-10-CM | POA: Diagnosis not present

## 2019-10-30 DIAGNOSIS — E785 Hyperlipidemia, unspecified: Secondary | ICD-10-CM | POA: Diagnosis not present

## 2019-10-30 DIAGNOSIS — Z79899 Other long term (current) drug therapy: Secondary | ICD-10-CM | POA: Diagnosis not present

## 2019-10-30 DIAGNOSIS — E538 Deficiency of other specified B group vitamins: Secondary | ICD-10-CM | POA: Diagnosis not present

## 2019-10-30 DIAGNOSIS — D61818 Other pancytopenia: Secondary | ICD-10-CM | POA: Diagnosis not present

## 2019-10-30 DIAGNOSIS — Z7952 Long term (current) use of systemic steroids: Secondary | ICD-10-CM | POA: Diagnosis not present

## 2019-10-30 DIAGNOSIS — J8410 Pulmonary fibrosis, unspecified: Secondary | ICD-10-CM | POA: Diagnosis not present

## 2019-10-31 DIAGNOSIS — J8410 Pulmonary fibrosis, unspecified: Secondary | ICD-10-CM | POA: Diagnosis not present

## 2019-10-31 DIAGNOSIS — J449 Chronic obstructive pulmonary disease, unspecified: Secondary | ICD-10-CM | POA: Diagnosis not present

## 2019-10-31 DIAGNOSIS — I10 Essential (primary) hypertension: Secondary | ICD-10-CM | POA: Diagnosis not present

## 2019-10-31 DIAGNOSIS — F410 Panic disorder [episodic paroxysmal anxiety] without agoraphobia: Secondary | ICD-10-CM | POA: Diagnosis not present

## 2019-10-31 DIAGNOSIS — C3432 Malignant neoplasm of lower lobe, left bronchus or lung: Secondary | ICD-10-CM | POA: Diagnosis not present

## 2019-10-31 DIAGNOSIS — M0579 Rheumatoid arthritis with rheumatoid factor of multiple sites without organ or systems involvement: Secondary | ICD-10-CM | POA: Diagnosis not present

## 2019-11-02 DIAGNOSIS — M0579 Rheumatoid arthritis with rheumatoid factor of multiple sites without organ or systems involvement: Secondary | ICD-10-CM | POA: Diagnosis not present

## 2019-11-02 DIAGNOSIS — I10 Essential (primary) hypertension: Secondary | ICD-10-CM | POA: Diagnosis not present

## 2019-11-02 DIAGNOSIS — C3432 Malignant neoplasm of lower lobe, left bronchus or lung: Secondary | ICD-10-CM | POA: Diagnosis not present

## 2019-11-02 DIAGNOSIS — J8410 Pulmonary fibrosis, unspecified: Secondary | ICD-10-CM | POA: Diagnosis not present

## 2019-11-02 DIAGNOSIS — F410 Panic disorder [episodic paroxysmal anxiety] without agoraphobia: Secondary | ICD-10-CM | POA: Diagnosis not present

## 2019-11-02 DIAGNOSIS — J449 Chronic obstructive pulmonary disease, unspecified: Secondary | ICD-10-CM | POA: Diagnosis not present

## 2019-11-03 DIAGNOSIS — I10 Essential (primary) hypertension: Secondary | ICD-10-CM | POA: Diagnosis not present

## 2019-11-03 DIAGNOSIS — M0579 Rheumatoid arthritis with rheumatoid factor of multiple sites without organ or systems involvement: Secondary | ICD-10-CM | POA: Diagnosis not present

## 2019-11-03 DIAGNOSIS — C3432 Malignant neoplasm of lower lobe, left bronchus or lung: Secondary | ICD-10-CM | POA: Diagnosis not present

## 2019-11-03 DIAGNOSIS — F410 Panic disorder [episodic paroxysmal anxiety] without agoraphobia: Secondary | ICD-10-CM | POA: Diagnosis not present

## 2019-11-03 DIAGNOSIS — J8410 Pulmonary fibrosis, unspecified: Secondary | ICD-10-CM | POA: Diagnosis not present

## 2019-11-03 DIAGNOSIS — J449 Chronic obstructive pulmonary disease, unspecified: Secondary | ICD-10-CM | POA: Diagnosis not present

## 2019-11-05 DIAGNOSIS — J8410 Pulmonary fibrosis, unspecified: Secondary | ICD-10-CM | POA: Diagnosis not present

## 2019-11-05 DIAGNOSIS — I10 Essential (primary) hypertension: Secondary | ICD-10-CM | POA: Diagnosis not present

## 2019-11-05 DIAGNOSIS — J449 Chronic obstructive pulmonary disease, unspecified: Secondary | ICD-10-CM | POA: Diagnosis not present

## 2019-11-05 DIAGNOSIS — C3432 Malignant neoplasm of lower lobe, left bronchus or lung: Secondary | ICD-10-CM | POA: Diagnosis not present

## 2019-11-05 DIAGNOSIS — F410 Panic disorder [episodic paroxysmal anxiety] without agoraphobia: Secondary | ICD-10-CM | POA: Diagnosis not present

## 2019-11-05 DIAGNOSIS — M0579 Rheumatoid arthritis with rheumatoid factor of multiple sites without organ or systems involvement: Secondary | ICD-10-CM | POA: Diagnosis not present

## 2019-11-06 ENCOUNTER — Other Ambulatory Visit: Payer: Self-pay

## 2019-11-06 ENCOUNTER — Other Ambulatory Visit: Payer: Medicare Other | Admitting: Licensed Clinical Social Worker

## 2019-11-06 ENCOUNTER — Other Ambulatory Visit: Payer: Medicare Other | Admitting: *Deleted

## 2019-11-06 DIAGNOSIS — M0579 Rheumatoid arthritis with rheumatoid factor of multiple sites without organ or systems involvement: Secondary | ICD-10-CM | POA: Diagnosis not present

## 2019-11-06 DIAGNOSIS — F410 Panic disorder [episodic paroxysmal anxiety] without agoraphobia: Secondary | ICD-10-CM | POA: Diagnosis not present

## 2019-11-06 DIAGNOSIS — Z515 Encounter for palliative care: Secondary | ICD-10-CM

## 2019-11-06 DIAGNOSIS — J449 Chronic obstructive pulmonary disease, unspecified: Secondary | ICD-10-CM | POA: Diagnosis not present

## 2019-11-06 DIAGNOSIS — J8410 Pulmonary fibrosis, unspecified: Secondary | ICD-10-CM | POA: Diagnosis not present

## 2019-11-06 DIAGNOSIS — I10 Essential (primary) hypertension: Secondary | ICD-10-CM | POA: Diagnosis not present

## 2019-11-06 DIAGNOSIS — C3432 Malignant neoplasm of lower lobe, left bronchus or lung: Secondary | ICD-10-CM | POA: Diagnosis not present

## 2019-11-06 NOTE — Progress Notes (Signed)
COMMUNITY PALLIATIVE CARE RN NOTE  PATIENT NAME: Kenneth [name] DOB: 1946-07-20 MRN: [id number]  PRIMARY CARE PROVIDER: [name], MD  RESPONSIBLE PARTY:  Acct ID - Guarantor Home Phone Work Phone Relationship Acct Type  [id number] - Jarquin,Filip* [phone]  Self P/F     [address], [place], [place] [address]   Covid-19 Pre-screening Negative  PLAN OF CARE and INTERVENTION:  1. ADVANCE CARE PLANNING/GOALS OF CARE: Goal is for patient to remain at home with his wife and son. He has a DNR. 2. PATIENT/CAREGIVER EDUCATION: Safe Transfers, Symptom Management 3. DISEASE STATUS: Joint visit made with Palliative care SW, Lynn Duffy. Met with patient and his wife in their home. Son, [name], was in another room throughout visit. Patient is lying in bed awake and alert. He just finished receiving a bed bath from bath aide with [facility]. He remains able to engage in appropriate conversation and is pleasant. His pain is controlled well with his current medication regimen. He denies any recent episodes of shortness of breath. He requires 1 person assistance with transfers. Some days he is transferred using a sliding board, and other days when he feels stronger, he will bear weight and transfer with assistance. He is non-ambulatory. He is given bed baths and his son feeds him. He states d/t his arthritis in both hands, it is difficult for him to hold utensils. He is continent of both bowel and bladder. He uses the bedside commode and a urinal. His LBM was yesterday 11/05/19. He if given a stool softener daily, and they will give patient Miralax if no BM in 3 days, which is usually effective. He continues with PT through [facility], who visits twice weekly. PT got him up to his wheelchair yesterday and he was able to tolerate sitting up for about 45 minutes to an hour. His appetite has improved some. He eats about 2 meals/day. Some days he may only take a few bites, but the next day will  usually eat fair. He denies dysphagia. No skin issues at this time, other than scattered bruising noted to bilateral arms. No redness noted today on his R ankle. They wish to continue on Palliative care where he can still receive PT for now. Will continue to monitor.   HISTORY OF PRESENT ILLNESS:  This is a 74 yo male who resides at home with his wife and son. Palliative care team continues to follow patient. Team to continue to visit monthly and PRN.  CODE STATUS: DNR ADVANCED DIRECTIVES: Y MOST FORM: no PPS: 30%   PHYSICAL EXAM:   VITALS: Today's Vitals   11/06/19 1337  BP: (!) 131/91  Pulse: 98  Resp: 18  Temp: (!) 97.5 F (36.4 C)  TempSrc: Temporal  SpO2: 93%  PainSc: 0-No pain    LUNGS: clear to auscultation  CARDIAC: Cor RRR EXTREMITIES: No edema SKIN: Skin is dry and intact, scattered bruising to bilateral arms, thin/frail skin  NEURO: Alert and oriented x 3, pleasant mood, generalized weakness, non-ambulatory   (Duration of visit and documentation 60 minutes)   Daryl Eastern, RN BSN

## 2019-11-06 NOTE — Progress Notes (Signed)
COMMUNITY PALLIATIVE CARE SW NOTE  PATIENT NAME: Kenneth [name] DOB: 1946/07/05 MRN: [id number]  PRIMARY CARE PROVIDER: [name], MD  RESPONSIBLE PARTY:  Acct ID - Guarantor Home Phone Work Phone Relationship Acct Type  [id number] - [name],[name]  Self P/F     [address], [place], [place] [address]     PLAN OF CARE and INTERVENTIONS:             1. GOALS OF CARE/ ADVANCE CARE PLANNING:  Patient's goal is to remain in his home.  Patient has a DNR. 2. SOCIAL/EMOTIONAL/SPIRITUAL ASSESSMENT/ INTERVENTIONS:  SW and Palliative Care RN, Kenneth [name], met with patient and his wife, Kenneth [name], in their home.  Patient had just received a bed bath and appeared to be resting comfortably.  Patient continues displaying humor with a strong faith.  This was also expressed by his wife, Kenneth [name].  Patient's sister recently died at [facility] from Parkinson's.  The funeral is tomorrow and Kenneth [name] was busy baking food for the reception.  Their son, Kenneth [name], lives with them and spends quality time with his father, as well as assisting with his ADLs.  SW provided active listening and supportive counseling. 3. PATIENT/CAREGIVER EDUCATION/ COPINGStanton [name] copes by problem-solving. 4. PERSONAL EMERGENCY PLAN:  Patient will remain in bed when he is too week to transfer to her w/c.  Kenneth [name] will contact EMS. 5. COMMUNITY RESOURCES COORDINATION/ HEALTH CARE NAVIGATION:  Patient receives services from [facility].  A CNA provides baths on Tuesday and Friday.  He also receives physical therapy. 6. FINANCIAL/LEGAL CONCERNS/INTERVENTIONS:  None.     SOCIAL HX:  Social History   Tobacco Use  . Smoking status: Former Smoker    Packs/day: 0.50    Years: 44.00    Pack years: 22.00    Types: Cigarettes    Quit date: 12/05/2017    Years since quitting: 1.9  . Smokeless tobacco: Never Used  . Tobacco comment: "quit cold [place]"  Substance Use Topics  . Alcohol use: No    CODE STATUS:  DNR  ADVANCED DIRECTIVES:  N MOST FORM COMPLETE:  N HOSPICE EDUCATION PROVIDED: N PPS:  Patient reports eating two "good" meals a day.  He cannot ambulate. Duration of visit and documentation:  60 minutes.      Kenneth Corn Addilyne Backs, LCSW

## 2019-11-07 ENCOUNTER — Other Ambulatory Visit: Payer: Self-pay

## 2019-11-07 DIAGNOSIS — J449 Chronic obstructive pulmonary disease, unspecified: Secondary | ICD-10-CM | POA: Diagnosis not present

## 2019-11-07 DIAGNOSIS — M0579 Rheumatoid arthritis with rheumatoid factor of multiple sites without organ or systems involvement: Secondary | ICD-10-CM | POA: Diagnosis not present

## 2019-11-07 DIAGNOSIS — I10 Essential (primary) hypertension: Secondary | ICD-10-CM | POA: Diagnosis not present

## 2019-11-07 DIAGNOSIS — C3432 Malignant neoplasm of lower lobe, left bronchus or lung: Secondary | ICD-10-CM | POA: Diagnosis not present

## 2019-11-07 DIAGNOSIS — F410 Panic disorder [episodic paroxysmal anxiety] without agoraphobia: Secondary | ICD-10-CM | POA: Diagnosis not present

## 2019-11-07 DIAGNOSIS — J8410 Pulmonary fibrosis, unspecified: Secondary | ICD-10-CM | POA: Diagnosis not present

## 2019-11-09 DIAGNOSIS — J449 Chronic obstructive pulmonary disease, unspecified: Secondary | ICD-10-CM | POA: Diagnosis not present

## 2019-11-09 DIAGNOSIS — C3432 Malignant neoplasm of lower lobe, left bronchus or lung: Secondary | ICD-10-CM | POA: Diagnosis not present

## 2019-11-09 DIAGNOSIS — M0579 Rheumatoid arthritis with rheumatoid factor of multiple sites without organ or systems involvement: Secondary | ICD-10-CM | POA: Diagnosis not present

## 2019-11-09 DIAGNOSIS — F410 Panic disorder [episodic paroxysmal anxiety] without agoraphobia: Secondary | ICD-10-CM | POA: Diagnosis not present

## 2019-11-09 DIAGNOSIS — I491 Atrial premature depolarization: Secondary | ICD-10-CM | POA: Diagnosis not present

## 2019-11-09 DIAGNOSIS — I10 Essential (primary) hypertension: Secondary | ICD-10-CM | POA: Diagnosis not present

## 2019-11-09 DIAGNOSIS — J8410 Pulmonary fibrosis, unspecified: Secondary | ICD-10-CM | POA: Diagnosis not present

## 2019-11-12 DIAGNOSIS — J8410 Pulmonary fibrosis, unspecified: Secondary | ICD-10-CM | POA: Diagnosis not present

## 2019-11-12 DIAGNOSIS — J449 Chronic obstructive pulmonary disease, unspecified: Secondary | ICD-10-CM | POA: Diagnosis not present

## 2019-11-12 DIAGNOSIS — M0579 Rheumatoid arthritis with rheumatoid factor of multiple sites without organ or systems involvement: Secondary | ICD-10-CM | POA: Diagnosis not present

## 2019-11-12 DIAGNOSIS — F410 Panic disorder [episodic paroxysmal anxiety] without agoraphobia: Secondary | ICD-10-CM | POA: Diagnosis not present

## 2019-11-12 DIAGNOSIS — C3432 Malignant neoplasm of lower lobe, left bronchus or lung: Secondary | ICD-10-CM | POA: Diagnosis not present

## 2019-11-12 DIAGNOSIS — I10 Essential (primary) hypertension: Secondary | ICD-10-CM | POA: Diagnosis not present

## 2019-11-13 DIAGNOSIS — F410 Panic disorder [episodic paroxysmal anxiety] without agoraphobia: Secondary | ICD-10-CM | POA: Diagnosis not present

## 2019-11-13 DIAGNOSIS — J449 Chronic obstructive pulmonary disease, unspecified: Secondary | ICD-10-CM | POA: Diagnosis not present

## 2019-11-13 DIAGNOSIS — J8410 Pulmonary fibrosis, unspecified: Secondary | ICD-10-CM | POA: Diagnosis not present

## 2019-11-13 DIAGNOSIS — M0579 Rheumatoid arthritis with rheumatoid factor of multiple sites without organ or systems involvement: Secondary | ICD-10-CM | POA: Diagnosis not present

## 2019-11-13 DIAGNOSIS — I10 Essential (primary) hypertension: Secondary | ICD-10-CM | POA: Diagnosis not present

## 2019-11-13 DIAGNOSIS — C3432 Malignant neoplasm of lower lobe, left bronchus or lung: Secondary | ICD-10-CM | POA: Diagnosis not present

## 2019-11-14 DIAGNOSIS — I10 Essential (primary) hypertension: Secondary | ICD-10-CM | POA: Diagnosis not present

## 2019-11-14 DIAGNOSIS — J8410 Pulmonary fibrosis, unspecified: Secondary | ICD-10-CM | POA: Diagnosis not present

## 2019-11-14 DIAGNOSIS — J449 Chronic obstructive pulmonary disease, unspecified: Secondary | ICD-10-CM | POA: Diagnosis not present

## 2019-11-14 DIAGNOSIS — F410 Panic disorder [episodic paroxysmal anxiety] without agoraphobia: Secondary | ICD-10-CM | POA: Diagnosis not present

## 2019-11-14 DIAGNOSIS — C3432 Malignant neoplasm of lower lobe, left bronchus or lung: Secondary | ICD-10-CM | POA: Diagnosis not present

## 2019-11-14 DIAGNOSIS — M0579 Rheumatoid arthritis with rheumatoid factor of multiple sites without organ or systems involvement: Secondary | ICD-10-CM | POA: Diagnosis not present

## 2019-11-19 DIAGNOSIS — J8410 Pulmonary fibrosis, unspecified: Secondary | ICD-10-CM | POA: Diagnosis not present

## 2019-11-19 DIAGNOSIS — M0579 Rheumatoid arthritis with rheumatoid factor of multiple sites without organ or systems involvement: Secondary | ICD-10-CM | POA: Diagnosis not present

## 2019-11-19 DIAGNOSIS — F410 Panic disorder [episodic paroxysmal anxiety] without agoraphobia: Secondary | ICD-10-CM | POA: Diagnosis not present

## 2019-11-19 DIAGNOSIS — J449 Chronic obstructive pulmonary disease, unspecified: Secondary | ICD-10-CM | POA: Diagnosis not present

## 2019-11-19 DIAGNOSIS — I10 Essential (primary) hypertension: Secondary | ICD-10-CM | POA: Diagnosis not present

## 2019-11-19 DIAGNOSIS — C3432 Malignant neoplasm of lower lobe, left bronchus or lung: Secondary | ICD-10-CM | POA: Diagnosis not present

## 2019-11-21 DIAGNOSIS — C3432 Malignant neoplasm of lower lobe, left bronchus or lung: Secondary | ICD-10-CM | POA: Diagnosis not present

## 2019-11-21 DIAGNOSIS — M0579 Rheumatoid arthritis with rheumatoid factor of multiple sites without organ or systems involvement: Secondary | ICD-10-CM | POA: Diagnosis not present

## 2019-11-21 DIAGNOSIS — I10 Essential (primary) hypertension: Secondary | ICD-10-CM | POA: Diagnosis not present

## 2019-11-21 DIAGNOSIS — F410 Panic disorder [episodic paroxysmal anxiety] without agoraphobia: Secondary | ICD-10-CM | POA: Diagnosis not present

## 2019-11-21 DIAGNOSIS — J449 Chronic obstructive pulmonary disease, unspecified: Secondary | ICD-10-CM | POA: Diagnosis not present

## 2019-11-21 DIAGNOSIS — J8410 Pulmonary fibrosis, unspecified: Secondary | ICD-10-CM | POA: Diagnosis not present

## 2019-11-23 DIAGNOSIS — J449 Chronic obstructive pulmonary disease, unspecified: Secondary | ICD-10-CM | POA: Diagnosis not present

## 2019-11-23 DIAGNOSIS — M0579 Rheumatoid arthritis with rheumatoid factor of multiple sites without organ or systems involvement: Secondary | ICD-10-CM | POA: Diagnosis not present

## 2019-11-23 DIAGNOSIS — F410 Panic disorder [episodic paroxysmal anxiety] without agoraphobia: Secondary | ICD-10-CM | POA: Diagnosis not present

## 2019-11-23 DIAGNOSIS — C3432 Malignant neoplasm of lower lobe, left bronchus or lung: Secondary | ICD-10-CM | POA: Diagnosis not present

## 2019-11-23 DIAGNOSIS — J8410 Pulmonary fibrosis, unspecified: Secondary | ICD-10-CM | POA: Diagnosis not present

## 2019-11-23 DIAGNOSIS — I10 Essential (primary) hypertension: Secondary | ICD-10-CM | POA: Diagnosis not present

## 2019-11-26 DIAGNOSIS — J449 Chronic obstructive pulmonary disease, unspecified: Secondary | ICD-10-CM | POA: Diagnosis not present

## 2019-11-26 DIAGNOSIS — J8410 Pulmonary fibrosis, unspecified: Secondary | ICD-10-CM | POA: Diagnosis not present

## 2019-11-26 DIAGNOSIS — M0579 Rheumatoid arthritis with rheumatoid factor of multiple sites without organ or systems involvement: Secondary | ICD-10-CM | POA: Diagnosis not present

## 2019-11-26 DIAGNOSIS — F410 Panic disorder [episodic paroxysmal anxiety] without agoraphobia: Secondary | ICD-10-CM | POA: Diagnosis not present

## 2019-11-26 DIAGNOSIS — C3432 Malignant neoplasm of lower lobe, left bronchus or lung: Secondary | ICD-10-CM | POA: Diagnosis not present

## 2019-11-26 DIAGNOSIS — I10 Essential (primary) hypertension: Secondary | ICD-10-CM | POA: Diagnosis not present

## 2019-11-27 DIAGNOSIS — J8410 Pulmonary fibrosis, unspecified: Secondary | ICD-10-CM | POA: Diagnosis not present

## 2019-11-27 DIAGNOSIS — M0579 Rheumatoid arthritis with rheumatoid factor of multiple sites without organ or systems involvement: Secondary | ICD-10-CM | POA: Diagnosis not present

## 2019-11-27 DIAGNOSIS — C3432 Malignant neoplasm of lower lobe, left bronchus or lung: Secondary | ICD-10-CM | POA: Diagnosis not present

## 2019-11-27 DIAGNOSIS — I10 Essential (primary) hypertension: Secondary | ICD-10-CM | POA: Diagnosis not present

## 2019-11-27 DIAGNOSIS — J449 Chronic obstructive pulmonary disease, unspecified: Secondary | ICD-10-CM | POA: Diagnosis not present

## 2019-11-27 DIAGNOSIS — F410 Panic disorder [episodic paroxysmal anxiety] without agoraphobia: Secondary | ICD-10-CM | POA: Diagnosis not present

## 2019-11-28 DIAGNOSIS — C3432 Malignant neoplasm of lower lobe, left bronchus or lung: Secondary | ICD-10-CM | POA: Diagnosis not present

## 2019-11-28 DIAGNOSIS — J8410 Pulmonary fibrosis, unspecified: Secondary | ICD-10-CM | POA: Diagnosis not present

## 2019-11-28 DIAGNOSIS — I10 Essential (primary) hypertension: Secondary | ICD-10-CM | POA: Diagnosis not present

## 2019-11-28 DIAGNOSIS — F410 Panic disorder [episodic paroxysmal anxiety] without agoraphobia: Secondary | ICD-10-CM | POA: Diagnosis not present

## 2019-11-28 DIAGNOSIS — J449 Chronic obstructive pulmonary disease, unspecified: Secondary | ICD-10-CM | POA: Diagnosis not present

## 2019-11-28 DIAGNOSIS — M0579 Rheumatoid arthritis with rheumatoid factor of multiple sites without organ or systems involvement: Secondary | ICD-10-CM | POA: Diagnosis not present

## 2019-11-29 DIAGNOSIS — C3432 Malignant neoplasm of lower lobe, left bronchus or lung: Secondary | ICD-10-CM | POA: Diagnosis not present

## 2019-11-29 DIAGNOSIS — G479 Sleep disorder, unspecified: Secondary | ICD-10-CM | POA: Diagnosis not present

## 2019-11-29 DIAGNOSIS — E538 Deficiency of other specified B group vitamins: Secondary | ICD-10-CM | POA: Diagnosis not present

## 2019-11-29 DIAGNOSIS — Z87891 Personal history of nicotine dependence: Secondary | ICD-10-CM | POA: Diagnosis not present

## 2019-11-29 DIAGNOSIS — M5136 Other intervertebral disc degeneration, lumbar region: Secondary | ICD-10-CM | POA: Diagnosis not present

## 2019-11-29 DIAGNOSIS — M109 Gout, unspecified: Secondary | ICD-10-CM | POA: Diagnosis not present

## 2019-11-29 DIAGNOSIS — M0579 Rheumatoid arthritis with rheumatoid factor of multiple sites without organ or systems involvement: Secondary | ICD-10-CM | POA: Diagnosis not present

## 2019-11-29 DIAGNOSIS — R627 Adult failure to thrive: Secondary | ICD-10-CM | POA: Diagnosis not present

## 2019-11-29 DIAGNOSIS — F410 Panic disorder [episodic paroxysmal anxiety] without agoraphobia: Secondary | ICD-10-CM | POA: Diagnosis not present

## 2019-11-29 DIAGNOSIS — M810 Age-related osteoporosis without current pathological fracture: Secondary | ICD-10-CM | POA: Diagnosis not present

## 2019-11-29 DIAGNOSIS — D61818 Other pancytopenia: Secondary | ICD-10-CM | POA: Diagnosis not present

## 2019-11-29 DIAGNOSIS — Z79891 Long term (current) use of opiate analgesic: Secondary | ICD-10-CM | POA: Diagnosis not present

## 2019-11-29 DIAGNOSIS — J8410 Pulmonary fibrosis, unspecified: Secondary | ICD-10-CM | POA: Diagnosis not present

## 2019-11-29 DIAGNOSIS — E876 Hypokalemia: Secondary | ICD-10-CM | POA: Diagnosis not present

## 2019-11-29 DIAGNOSIS — G8929 Other chronic pain: Secondary | ICD-10-CM | POA: Diagnosis not present

## 2019-11-29 DIAGNOSIS — F329 Major depressive disorder, single episode, unspecified: Secondary | ICD-10-CM | POA: Diagnosis not present

## 2019-11-29 DIAGNOSIS — Z86718 Personal history of other venous thrombosis and embolism: Secondary | ICD-10-CM | POA: Diagnosis not present

## 2019-11-29 DIAGNOSIS — J449 Chronic obstructive pulmonary disease, unspecified: Secondary | ICD-10-CM | POA: Diagnosis not present

## 2019-11-29 DIAGNOSIS — I10 Essential (primary) hypertension: Secondary | ICD-10-CM | POA: Diagnosis not present

## 2019-11-29 DIAGNOSIS — E785 Hyperlipidemia, unspecified: Secondary | ICD-10-CM | POA: Diagnosis not present

## 2019-11-29 DIAGNOSIS — Z7952 Long term (current) use of systemic steroids: Secondary | ICD-10-CM | POA: Diagnosis not present

## 2019-11-29 DIAGNOSIS — H9190 Unspecified hearing loss, unspecified ear: Secondary | ICD-10-CM | POA: Diagnosis not present

## 2019-11-29 DIAGNOSIS — Z9181 History of falling: Secondary | ICD-10-CM | POA: Diagnosis not present

## 2019-11-29 DIAGNOSIS — G43809 Other migraine, not intractable, without status migrainosus: Secondary | ICD-10-CM | POA: Diagnosis not present

## 2019-12-02 ENCOUNTER — Emergency Department (HOSPITAL_COMMUNITY): Payer: Medicare Other

## 2019-12-02 ENCOUNTER — Encounter (HOSPITAL_COMMUNITY): Payer: Self-pay | Admitting: Emergency Medicine

## 2019-12-02 ENCOUNTER — Inpatient Hospital Stay (HOSPITAL_COMMUNITY)
Admission: EM | Admit: 2019-12-02 | Discharge: 2019-12-05 | DRG: 175 | Disposition: A | Discharge status: Home Health | Payer: Medicare Other | Attending: Internal Medicine | Admitting: Internal Medicine

## 2019-12-02 ENCOUNTER — Other Ambulatory Visit: Payer: Self-pay

## 2019-12-02 DIAGNOSIS — Z7951 Long term (current) use of inhaled steroids: Secondary | ICD-10-CM | POA: Diagnosis not present

## 2019-12-02 DIAGNOSIS — F329 Major depressive disorder, single episode, unspecified: Secondary | ICD-10-CM | POA: Diagnosis present

## 2019-12-02 DIAGNOSIS — Z66 Do not resuscitate: Secondary | ICD-10-CM | POA: Diagnosis present

## 2019-12-02 DIAGNOSIS — I82412 Acute embolism and thrombosis of left femoral vein: Secondary | ICD-10-CM | POA: Diagnosis present

## 2019-12-02 DIAGNOSIS — R0689 Other abnormalities of breathing: Secondary | ICD-10-CM | POA: Diagnosis not present

## 2019-12-02 DIAGNOSIS — J9691 Respiratory failure, unspecified with hypoxia: Secondary | ICD-10-CM

## 2019-12-02 DIAGNOSIS — I82452 Acute embolism and thrombosis of left peroneal vein: Secondary | ICD-10-CM | POA: Diagnosis present

## 2019-12-02 DIAGNOSIS — E785 Hyperlipidemia, unspecified: Secondary | ICD-10-CM | POA: Diagnosis present

## 2019-12-02 DIAGNOSIS — J9601 Acute respiratory failure with hypoxia: Secondary | ICD-10-CM

## 2019-12-02 DIAGNOSIS — J439 Emphysema, unspecified: Secondary | ICD-10-CM | POA: Diagnosis present

## 2019-12-02 DIAGNOSIS — I2699 Other pulmonary embolism without acute cor pulmonale: Secondary | ICD-10-CM | POA: Diagnosis present

## 2019-12-02 DIAGNOSIS — Z923 Personal history of irradiation: Secondary | ICD-10-CM

## 2019-12-02 DIAGNOSIS — Z87891 Personal history of nicotine dependence: Secondary | ICD-10-CM | POA: Diagnosis not present

## 2019-12-02 DIAGNOSIS — J8410 Pulmonary fibrosis, unspecified: Secondary | ICD-10-CM | POA: Diagnosis present

## 2019-12-02 DIAGNOSIS — J4489 Other specified chronic obstructive pulmonary disease: Secondary | ICD-10-CM | POA: Diagnosis present

## 2019-12-02 DIAGNOSIS — Z20822 Contact with and (suspected) exposure to covid-19: Secondary | ICD-10-CM | POA: Diagnosis present

## 2019-12-02 DIAGNOSIS — Z7401 Bed confinement status: Secondary | ICD-10-CM | POA: Diagnosis not present

## 2019-12-02 DIAGNOSIS — Z886 Allergy status to analgesic agent status: Secondary | ICD-10-CM

## 2019-12-02 DIAGNOSIS — Z85118 Personal history of other malignant neoplasm of bronchus and lung: Secondary | ICD-10-CM

## 2019-12-02 DIAGNOSIS — Z800 Family history of malignant neoplasm of digestive organs: Secondary | ICD-10-CM

## 2019-12-02 DIAGNOSIS — Z6821 Body mass index (BMI) 21.0-21.9, adult: Secondary | ICD-10-CM

## 2019-12-02 DIAGNOSIS — M2550 Pain in unspecified joint: Secondary | ICD-10-CM | POA: Diagnosis not present

## 2019-12-02 DIAGNOSIS — Z803 Family history of malignant neoplasm of breast: Secondary | ICD-10-CM | POA: Diagnosis not present

## 2019-12-02 DIAGNOSIS — Z7952 Long term (current) use of systemic steroids: Secondary | ICD-10-CM | POA: Diagnosis not present

## 2019-12-02 DIAGNOSIS — M069 Rheumatoid arthritis, unspecified: Secondary | ICD-10-CM | POA: Diagnosis present

## 2019-12-02 DIAGNOSIS — R000 Tachycardia, unspecified: Secondary | ICD-10-CM | POA: Diagnosis not present

## 2019-12-02 DIAGNOSIS — Z801 Family history of malignant neoplasm of trachea, bronchus and lung: Secondary | ICD-10-CM

## 2019-12-02 DIAGNOSIS — R404 Transient alteration of awareness: Secondary | ICD-10-CM | POA: Diagnosis not present

## 2019-12-02 DIAGNOSIS — Z86718 Personal history of other venous thrombosis and embolism: Secondary | ICD-10-CM

## 2019-12-02 DIAGNOSIS — E43 Unspecified severe protein-calorie malnutrition: Secondary | ICD-10-CM | POA: Diagnosis present

## 2019-12-02 DIAGNOSIS — J449 Chronic obstructive pulmonary disease, unspecified: Secondary | ICD-10-CM | POA: Diagnosis not present

## 2019-12-02 DIAGNOSIS — R0600 Dyspnea, unspecified: Secondary | ICD-10-CM | POA: Diagnosis not present

## 2019-12-02 DIAGNOSIS — L89152 Pressure ulcer of sacral region, stage 2: Secondary | ICD-10-CM | POA: Diagnosis present

## 2019-12-02 DIAGNOSIS — M109 Gout, unspecified: Secondary | ICD-10-CM | POA: Diagnosis present

## 2019-12-02 DIAGNOSIS — I10 Essential (primary) hypertension: Secondary | ICD-10-CM | POA: Diagnosis present

## 2019-12-02 DIAGNOSIS — I82442 Acute embolism and thrombosis of left tibial vein: Secondary | ICD-10-CM | POA: Diagnosis present

## 2019-12-02 DIAGNOSIS — H9190 Unspecified hearing loss, unspecified ear: Secondary | ICD-10-CM | POA: Diagnosis present

## 2019-12-02 DIAGNOSIS — I2602 Saddle embolus of pulmonary artery with acute cor pulmonale: Secondary | ICD-10-CM | POA: Diagnosis not present

## 2019-12-02 DIAGNOSIS — Z825 Family history of asthma and other chronic lower respiratory diseases: Secondary | ICD-10-CM | POA: Diagnosis not present

## 2019-12-02 DIAGNOSIS — I959 Hypotension, unspecified: Secondary | ICD-10-CM | POA: Diagnosis present

## 2019-12-02 DIAGNOSIS — I82432 Acute embolism and thrombosis of left popliteal vein: Secondary | ICD-10-CM | POA: Diagnosis present

## 2019-12-02 DIAGNOSIS — Z79899 Other long term (current) drug therapy: Secondary | ICD-10-CM

## 2019-12-02 DIAGNOSIS — R0602 Shortness of breath: Secondary | ICD-10-CM | POA: Diagnosis not present

## 2019-12-02 DIAGNOSIS — R0902 Hypoxemia: Secondary | ICD-10-CM | POA: Diagnosis not present

## 2019-12-02 LAB — BASIC METABOLIC PANEL
Anion gap: 11 (ref 5–15)
BUN: 16 mg/dL (ref 8–23)
CO2: 26 mmol/L (ref 22–32)
Calcium: 9.4 mg/dL (ref 8.9–10.3)
Chloride: 104 mmol/L (ref 98–111)
Creatinine, Ser: 0.97 mg/dL (ref 0.61–1.24)
GFR calc Af Amer: >60 mL/min (ref >60)
GFR calc non Af Amer: >60 mL/min (ref >60)
Glucose, Bld: 145 mg/dL — ABNORMAL HIGH (ref 70–99)
Potassium: 3.8 mmol/L (ref 3.5–5.1)
Sodium: 141 mmol/L (ref 135–145)

## 2019-12-02 LAB — CBC
HCT: 47.8 % (ref 39.0–52.0)
Hemoglobin: 15.6 g/dL (ref 13.0–17.0)
MCH: 38.7 pg — ABNORMAL HIGH (ref 26.0–34.0)
MCHC: 32.6 g/dL (ref 30.0–36.0)
MCV: 118.6 fL — ABNORMAL HIGH (ref 80.0–100.0)
Platelets: 150 10*3/uL (ref 150–400)
RBC: 4.03 MIL/uL — ABNORMAL LOW (ref 4.22–5.81)
RDW: 14.5 % (ref 11.5–15.5)
WBC: 12.1 10*3/uL — ABNORMAL HIGH (ref 4.0–10.5)
nRBC: 0 % (ref 0.0–0.2)

## 2019-12-02 LAB — RESPIRATORY PANEL BY RT PCR (FLU A&B, COVID)
Influenza A by PCR: NEGATIVE
Influenza B by PCR: NEGATIVE
SARS Coronavirus 2 by RT PCR: NEGATIVE

## 2019-12-02 LAB — PROTIME-INR
INR: 0.9 (ref 0.8–1.2)
Prothrombin Time: 12.4 seconds (ref 11.4–15.2)

## 2019-12-02 LAB — TROPONIN I (HIGH SENSITIVITY)
Troponin I (High Sensitivity): 266 ng/L (ref <18)
Troponin I (High Sensitivity): 690 ng/L (ref <18)

## 2019-12-02 LAB — POC SARS CORONAVIRUS 2 AG -  ED: SARS Coronavirus 2 Ag: NEGATIVE

## 2019-12-02 LAB — APTT: aPTT: 32 seconds (ref 24–36)

## 2019-12-02 LAB — D-DIMER, QUANTITATIVE: D-Dimer, Quant: >20 ug/mL-FEU — ABNORMAL HIGH (ref 0.00–0.50)

## 2019-12-02 LAB — BRAIN NATRIURETIC PEPTIDE: B Natriuretic Peptide: 353.4 pg/mL — ABNORMAL HIGH (ref 0.0–100.0)

## 2019-12-02 MED ORDER — UMECLIDINIUM BROMIDE 62.5 MCG/INH IN AEPB: 1.0000 | INHALATION_SPRAY | Freq: Every day | RESPIRATORY_TRACT | Status: DC

## 2019-12-02 MED ORDER — HEPARIN (PORCINE) 25000 UT/250ML-% IV SOLN
1200.0000 [IU]/h | INTRAVENOUS | Status: DC
  Administered 2019-12-02: 1200 [IU]/h via INTRAVENOUS
  Filled 2019-12-02: qty 250

## 2019-12-02 MED ORDER — FOLIC ACID 1 MG PO TABS
2.0000 mg | ORAL_TABLET | Freq: Every day | ORAL | Status: DC
  Administered 2019-12-02 – 2019-12-05 (×4): 2 mg via ORAL
  Filled 2019-12-02 (×4): qty 2

## 2019-12-02 MED ORDER — IOHEXOL 350 MG/ML SOLN
80.0000 mL | Freq: Once | INTRAVENOUS | Status: AC | PRN
  Administered 2019-12-02: 80 mL via INTRAVENOUS

## 2019-12-02 MED ORDER — UMECLIDINIUM BROMIDE 62.5 MCG/INH IN AEPB
1.0000 | INHALATION_SPRAY | Freq: Every day | RESPIRATORY_TRACT | Status: DC
  Filled 2019-12-02: qty 7

## 2019-12-02 MED ORDER — VENLAFAXINE HCL ER 37.5 MG PO CP24
37.5000 mg | ORAL_CAPSULE | Freq: Every day | ORAL | Status: DC
  Administered 2019-12-02 – 2019-12-05 (×4): 37.5 mg via ORAL
  Filled 2019-12-02 (×4): qty 1

## 2019-12-02 MED ORDER — PREDNISONE 5 MG PO TABS
2.5000 mg | ORAL_TABLET | Freq: Every day | ORAL | Status: DC
  Administered 2019-12-02 – 2019-12-05 (×4): 2.5 mg via ORAL
  Filled 2019-12-02 (×4): qty 1

## 2019-12-02 MED ORDER — CLONAZEPAM 0.5 MG PO TABS
0.5000 mg | ORAL_TABLET | Freq: Three times a day (TID) | ORAL | Status: DC
  Administered 2019-12-02 – 2019-12-05 (×10): 0.5 mg via ORAL
  Filled 2019-12-02 (×10): qty 1

## 2019-12-02 MED ORDER — DOCUSATE SODIUM 100 MG PO CAPS
100.0000 mg | ORAL_CAPSULE | Freq: Every day | ORAL | Status: DC
  Administered 2019-12-02 – 2019-12-05 (×4): 100 mg via ORAL
  Filled 2019-12-02 (×4): qty 1

## 2019-12-02 MED ORDER — HYDROCODONE-ACETAMINOPHEN 5-325 MG PO TABS
1.0000 | ORAL_TABLET | Freq: Three times a day (TID) | ORAL | Status: DC
  Administered 2019-12-02 – 2019-12-05 (×10): 1 via ORAL
  Filled 2019-12-02 (×10): qty 1

## 2019-12-02 MED ORDER — POTASSIUM CHLORIDE CRYS ER 20 MEQ PO TBCR
20.0000 meq | EXTENDED_RELEASE_TABLET | Freq: Every day | ORAL | Status: DC
  Administered 2019-12-02 – 2019-12-05 (×4): 20 meq via ORAL
  Filled 2019-12-02 (×4): qty 1

## 2019-12-02 MED ORDER — VALACYCLOVIR HCL 500 MG PO TABS
500.0000 mg | ORAL_TABLET | Freq: Two times a day (BID) | ORAL | Status: DC
  Administered 2019-12-02 – 2019-12-05 (×7): 500 mg via ORAL
  Filled 2019-12-02 (×7): qty 1

## 2019-12-02 MED ORDER — IPRATROPIUM-ALBUTEROL 0.5-2.5 (3) MG/3ML IN SOLN
3.0000 mL | Freq: Four times a day (QID) | RESPIRATORY_TRACT | Status: DC
  Administered 2019-12-02 – 2019-12-03 (×5): 3 mL via RESPIRATORY_TRACT
  Filled 2019-12-02 (×5): qty 3

## 2019-12-02 MED ORDER — VITAMIN B-12 1000 MCG PO TABS
1000.0000 ug | ORAL_TABLET | Freq: Every day | ORAL | Status: DC
  Administered 2019-12-03 – 2019-12-05 (×3): 1000 ug via ORAL
  Filled 2019-12-02 (×3): qty 1

## 2019-12-02 MED ORDER — UMECLIDINIUM BROMIDE 62.5 MCG/INH IN AEPB
1.0000 | INHALATION_SPRAY | Freq: Every day | RESPIRATORY_TRACT | Status: DC
  Administered 2019-12-03 – 2019-12-05 (×3): 1 via RESPIRATORY_TRACT
  Filled 2019-12-02: qty 7

## 2019-12-02 MED ORDER — HEPARIN BOLUS VIA INFUSION
2200.0000 [IU] | Freq: Once | INTRAVENOUS | Status: AC
  Administered 2019-12-02: 2200 [IU] via INTRAVENOUS
  Filled 2019-12-02: qty 2200

## 2019-12-02 MED ORDER — SODIUM CHLORIDE (PF) 0.9 % IJ SOLN
INTRAMUSCULAR | Status: AC
  Filled 2019-12-02: qty 50

## 2019-12-02 MED ORDER — MOMETASONE FURO-FORMOTEROL FUM 200-5 MCG/ACT IN AERO
2.0000 | INHALATION_SPRAY | Freq: Two times a day (BID) | RESPIRATORY_TRACT | Status: DC
  Administered 2019-12-02 – 2019-12-05 (×7): 2 via RESPIRATORY_TRACT
  Filled 2019-12-02: qty 8.8

## 2019-12-02 NOTE — ED Notes (Addendum)
This Probation officer will begin heparin medication .

## 2019-12-02 NOTE — Progress Notes (Addendum)
ANTICOAGULATION CONSULT NOTE - Initial Consult  Pharmacy Consult for IV heparin Indication: pulmonary embolus  Allergies  Allergen Reactions  . Other     "CILLIN" Family Has patient had a PCN reaction causing immediate rash, facial/tongue/throat swelling, SOB or lightheadedness with hypotension: no Has patient had a PCN reaction causing severe rash involving mucus membranes or skin necrosis: yes Has patient had a PCN reaction that required hospitalization: no Has patient had a PCN reaction occurring within the last 10 years: no If all of the above answers are "NO", then may proceed with Cephalosporin use.   . Aspirin Other (See Comments)    Gi upset  . Naproxen Sodium Other (See Comments)    Whelps in large doses **ALEVE**    Patient Measurements: Height: 5\' 10"  (177.8 cm) Weight: 150 lb (68 kg) IBW/kg (Calculated) : 73 Heparin Dosing Weight: total body weight   Vital Signs: Temp: 98.8 F (37.1 C) (02/14 1346) BP: 115/75 (02/14 1530) Pulse Rate: 98 (02/14 1530)  Labs: Recent Labs    12/02/19 1345  HGB 15.6  HCT 47.8  PLT 150  CREATININE 0.97  TROPONINIHS 266*    Estimated Creatinine Clearance: 66.2 mL/min (by C-G formula based on SCr of 0.97 mg/dL).   Medical History: Past Medical History:  Diagnosis Date  . Abnormal LFTs   . Anxiety   . COPD (chronic obstructive pulmonary disease) (HCC)    emphysema.  . CTS (carpal tunnel syndrome)    Left  . DDD (degenerative disc disease)   . Depression    mild  . Elevated MCV   . Emphysema of lung ([place])   . Gout   . Gynecomastia   . Hearing loss   . History of radiation therapy 10/17/17-10/24/17   left lung treated to 54 Gy in 3 fractions  . Hyperlipidemia   . Microhematuria   . Nephrolithiasis   . Ocular migraine   . Panic attack   . Rheumatoid arthritis(714.0) dx 2000  . Situational hypertension   . Tobacco abuse     Assessment: 5 y/oM who presented to [facility] ED on 12/02/2019 with shortness of breath.  D-dimer > 20. CTa chest + for bilateral segmental PE with early right heart strain. Pharmacy consulted for IV heparin dosing. 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 H/H, Pltc WNL.   Goal of Therapy:  Heparin level 0.3-0.7 units/ml Monitor platelets by anticoagulation protocol: Yes   Plan:   Baseline aPTT, PT/INR  Heparin 2200 units IV bolus x 1, then start heparin infusion at 1200 units/hr  Heparin level 8 hours after initiation of infusion  Daily CBC, heparin level  Monitor closely for s/sx of bleeding   Lindell Spar, PharmD, BCPS Clinical Pharmacist 12/02/2019,4:04 PM

## 2019-12-02 NOTE — H&P (Signed)
History and Physical    Kenneth [name] VQQ:[id number] DOB: September 13, 1946 DOA: 12/02/2019  PCP: [name], MD  Patient coming from: Home, lives with wife. Son also is caretaker.   I have personally briefly reviewed patient's old medical records in [place]  Chief Complaint: increasing shortness of breath  HPI: Kenneth [name] is a 74 y.o. male with medical history significant for RA previously on methotrexate, severe emphysema, non-small cell lung cancer s/p radiation in 2018, history of left lower extremity DVT who presented to the ED for increasing shortness of breath. History mostly obtained from son and ER physician given patient was not a great historian.  Son was helping to turn him to lay on his right side today to evaluate some pressure ulcers when patient developed sudden acute onset of shortness of breath and tachypnea.  Patient has been bedbound since October 2020 since he became progressively weak and had recurrent falls.  He is currently followed by palliative care outpatient.   In the ER, he was afebrile, tachycardic and tachypnea initially requiring 15L NRB but was able to wean down to 4L via [place].   D-dimer greater than 20.  CTA chest revealing for bilateral segmental pulmonary emboli with signs of right heart strain.  Troponin elevated to 266. Negative COVID and flu PCR test  He was evaluated by critical care in the ED and no indication for TPA or catheter directed anticoagulation was recommended.  Patient was started on heparin drip and asked to be admitted by hospitalist for further work-up.  Review of Systems:  Constitutional: No Weight Change, No Fever ENT/Mouth: No sore throat, No Rhinorrhea Eyes: No Eye Pain, No Vision Changes Cardiovascular: No Chest Pain, + SOB, Respiratory: No Cough, No Sputum Gastrointestinal: No Nausea, No Vomiting, No Diarrhea, No Constipation, No Pain Genitourinary: + Urinary Incontinence Musculoskeletal: No Arthralgias, No  Myalgias Skin: No Skin Lesions, No Pruritus, Neuro: + Weakness, No Numbness Psych: No Anxiety/Panic, No Depression, no decrease appetite Heme/Lymph: No Bruising, No Bleeding  Past Medical History:  Diagnosis Date  . Abnormal LFTs   . Anxiety   . COPD (chronic obstructive pulmonary disease) (HCC)    emphysema.  . CTS (carpal tunnel syndrome)    Left  . DDD (degenerative disc disease)   . Depression    mild  . Elevated MCV   . Emphysema of lung ([place])   . Gout   . Gynecomastia   . Hearing loss   . History of radiation therapy 10/17/17-10/24/17   left lung treated to 54 Gy in 3 fractions  . Hyperlipidemia   . Microhematuria   . Nephrolithiasis   . Ocular migraine   . Panic attack   . Rheumatoid arthritis(714.0) dx 2000  . Situational hypertension   . Tobacco abuse     Past Surgical History:  Procedure Laterality Date  . APPENDECTOMY    . COLONOSCOPY    . CYSTOSTOMY W/ BLADDER BIOPSY  2006  . ESOPHAGOGASTRODUODENOSCOPY N/A 04/30/2016   Procedure: ESOPHAGOGASTRODUODENOSCOPY (EGD);  Surgeon: Doran Stabler, MD;  Location: [facility];  Service: Endoscopy;  Laterality: N/A;     reports that he quit smoking about 1 years ago. His smoking use included cigarettes. He has a 22.00 pack-year smoking history. He has never used smokeless tobacco. He reports that he does not drink alcohol or use drugs.  Allergies  Allergen Reactions  . Other Other (See Comments)    "CILLIN" Family Has patient had a PCN reaction causing immediate rash,  facial/tongue/throat swelling, SOB or lightheadedness with hypotension: no Has patient had a PCN reaction causing severe rash involving mucus membranes or skin necrosis: yes Has patient had a PCN reaction that required hospitalization: no Has patient had a PCN reaction occurring within the last 10 years: no If all of the above answers are "NO", then may proceed with Cephalosporin use.   . Aspirin Other (See Comments)    Gi upset  . Naproxen  Sodium Other (See Comments)    Whelps in large doses **ALEVE**    Family History  Problem Relation Age of Onset  . Lung cancer Father   . Asthma Paternal Grandmother   . Lung cancer Paternal Aunt   . Breast cancer Maternal Aunt   . Breast cancer Maternal Aunt   . Stomach cancer Maternal Uncle   . Colon cancer Neg Hx      Prior to Admission medications   Medication Sig Start Date End Date Taking? Authorizing Provider  albuterol (VENTOLIN HFA) 108 (90 Base) MCG/ACT inhaler Inhale 1-2 puffs into the lungs every 6 (six) hours as needed for wheezing or shortness of breath.   Yes [provider]  amLODipine (NORVASC) 5 MG tablet Take 5 mg by mouth every evening.  08/06/17  Yes [provider]  cholecalciferol (VITAMIN D) 1000 UNITS tablet Take 1,000 Units by mouth daily.     Yes [provider]  clonazePAM (KLONOPIN) 0.5 MG tablet Take 0.5 mg by mouth 3 (three) times daily.    Yes [provider]  docusate sodium (COLACE) 100 MG capsule Take 100 mg by mouth daily.   Yes [provider]  Fluticasone-Umeclidin-Vilant (TRELEGY ELLIPTA) 100-62.5-25 MCG/INH AEPB Inhale 1 puff into the lungs every evening.    Yes [provider]  folic acid (FOLVITE) 1 MG tablet Take 5 tablets (5 mg total) by mouth daily. Patient taking differently: Take 6 mg by mouth daily.  06/16/18  Yes Aline August, MD  HYDROcodone-acetaminophen (NORCO/VICODIN) 5-325 MG tablet Take 1 tablet by mouth 3 (three) times daily.    Yes [provider]  potassium chloride SA (KLOR-CON) 20 MEQ tablet Take 20 mEq by mouth daily.   Yes [provider]  predniSONE (DELTASONE) 1 MG tablet Take 2.5 mg by mouth daily. 05/18/18  Yes [provider]  triamcinolone cream (KENALOG) 0.1 % Apply 1 application topically daily as needed for itching. 10/16/19  Yes [provider]  valACYclovir (VALTREX) 500 MG tablet Take 500 mg by mouth 2 (two) times daily.     Yes [provider]  venlafaxine XR (EFFEXOR-XR) 37.5 MG 24 hr capsule Take 37.5 mg by mouth at bedtime. 08/07/19  Yes [provider]  vitamin B-12 1000 MCG tablet Take 1 tablet (1,000 mcg total) by mouth daily. 06/16/18  Yes Aline August, MD    Physical Exam: Vitals:   12/02/19 1900 12/02/19 1930 12/02/19 2000 12/02/19 2030  BP: (!) 129/95 117/85 128/81 137/88  Pulse: 94 95 94   Resp: (!) 29 (!) 26 (!) 25 (!) 27  Temp:      SpO2: 91% 92% 92%   Weight:      Height:        Constitutional: Thin, cachectic, chronically ill-appearing male sitting at 40 degree incline in bed Vitals:   12/02/19 1900 12/02/19 1930 12/02/19 2000 12/02/19 2030  BP: (!) 129/95 117/85 128/81 137/88  Pulse: 94 95 94   Resp: (!) 29 (!) 26 (!) 25 (!) 27  Temp:  SpO2: 91% 92% 92%   Weight:      Height:       Eyes: PERRL, lids and conjunctivae normal ENMT: Mucous membranes are moist.  Has hoarseness of voice. Neck: normal, supple, no masses, no thyromegaly Respiratory: Bibasilar crackles with normal respiratory effort on 4 L via nasal cannula.  No accessory muscle use.  Cardiovascular: Tachycardic, no murmurs / rubs / gallops. No extremity edema. 2+ pedal pulses..  Abdomen: no tenderness, no masses palpated.  Bowel sounds positive.  Musculoskeletal: no clubbing / cyanosis. No joint deformity upper and lower extremities.  Skin: Scattered bruising noted on thin skin on upper extremity/forearm Neurologic: CN 2-12 grossly intact. Sensation intact.  Strong hand grip but 1/5 strength in lower extremity.   Psychiatric: Normal judgment and insight. Alert and oriented x 3. Normal mood.     Labs on Admission: I have personally reviewed following labs and imaging studies  CBC: Recent Labs  Lab 12/02/19 1345  WBC 12.1*  HGB 15.6  HCT 47.8  MCV 118.6*  PLT 017   Basic Metabolic Panel: Recent Labs  Lab 12/02/19 1345  NA 141  K 3.8  CL 104  CO2 26  GLUCOSE 145*  BUN 16   CREATININE 0.97  CALCIUM 9.4   GFR: Estimated Creatinine Clearance: 66.2 mL/min (by C-G formula based on SCr of 0.97 mg/dL). Liver Function Tests: No results for input(s): AST, ALT, ALKPHOS, BILITOT, PROT, ALBUMIN in the last 168 hours. No results for input(s): LIPASE, AMYLASE in the last 168 hours. No results for input(s): AMMONIA in the last 168 hours. Coagulation Profile: Recent Labs  Lab 12/02/19 1630  INR 0.9   Cardiac Enzymes: No results for input(s): CKTOTAL, CKMB, CKMBINDEX, TROPONINI in the last 168 hours. BNP (last 3 results) No results for input(s): PROBNP in the last 8760 hours. HbA1C: No results for input(s): HGBA1C in the last 72 hours. CBG: No results for input(s): GLUCAP in the last 168 hours. Lipid Profile: No results for input(s): CHOL, HDL, LDLCALC, TRIG, CHOLHDL, LDLDIRECT in the last 72 hours. Thyroid Function Tests: No results for input(s): TSH, T4TOTAL, FREET4, T3FREE, THYROIDAB in the last 72 hours. Anemia Panel: No results for input(s): VITAMINB12, FOLATE, FERRITIN, TIBC, IRON, RETICCTPCT in the last 72 hours. Urine analysis:    Component Value Date/Time   COLORURINE YELLOW 10/01/2019 1613   APPEARANCEUR CLEAR 10/01/2019 1613   LABSPEC 1.025 10/01/2019 1613   PHURINE 5.0 10/01/2019 1613   GLUCOSEU NEGATIVE 10/01/2019 1613   HGBUR NEGATIVE 10/01/2019 1613   BILIRUBINUR NEGATIVE 10/01/2019 1613   KETONESUR NEGATIVE 10/01/2019 1613   PROTEINUR NEGATIVE 10/01/2019 1613   NITRITE NEGATIVE 10/01/2019 1613   LEUKOCYTESUR NEGATIVE 10/01/2019 1613    Radiological Exams on Admission: CT Angio Chest PE W and/or Wo Contrast  Result Date: 12/02/2019 CLINICAL DATA:  Shortness of breath, elevated D-dimer, history of COPD EXAM: CT ANGIOGRAPHY CHEST WITH CONTRAST TECHNIQUE: Multidetector CT imaging of the chest was performed using the standard protocol during bolus administration of intravenous contrast. Multiplanar CT image reconstructions and MIPs were  obtained to evaluate the vascular anatomy. CONTRAST:  20mL OMNIPAQUE IOHEXOL 350 MG/ML SOLN COMPARISON:  12/02/2019, 09/07/2019 FINDINGS: Cardiovascular: This is a technically adequate evaluation of the pulmonary vasculature. There are bilateral segmental pulmonary emboli greatest in the right upper and left lower lobe distributions. Moderate clot burden. There is straightening of the interventricular septum with no frank right ventricular dilatation at this time. Thoracic aorta is normal in caliber. Mild atherosclerosis of the aorta  and coronary vessels. No pericardial effusion. Mediastinum/Nodes: No pathologic mediastinal or hilar adenopathy. Lungs/Pleura: There is severe emphysema, with chronic bibasilar scarring and fibrosis. Increased consolidation within the right lower lobe is felt to be hypoventilatory rather than airspace disease or infarct. No effusion or pneumothorax. Central airways are patent. There is mucoid material within the bronchus intermedius. Upper Abdomen: Calcified gallstones are identified without cholecystitis. The remainder of the upper abdomen is unremarkable. Musculoskeletal: There are no acute displaced fractures. The reconstructed images demonstrate no additional findings. Review of the MIP images confirms the above findings. IMPRESSION: 1. Bilateral segmental pulmonary emboli. There is straightening of the interventricular septum consistent with early right heart strain. 2. Severe background emphysema, with bibasilar scarring and atelectasis. 3. Cholelithiasis without cholecystitis. These results were called by telephone at the time of interpretation on 12/02/2019 at 5:06 pm to provider [facility] , who verbally acknowledged these results. Electronically Signed   By: Randa Ngo M.D.   On: 12/02/2019 17:06   DG Chest Port 1 View  Result Date: 12/02/2019 CLINICAL DATA:  Dyspnea, low O2 sats. EXAM: PORTABLE CHEST 1 VIEW COMPARISON:  Chest x-rays dated 10/01/2019 and 08/29/2019.  FINDINGS: Stable bibasilar scarring/fibrosis. No new lung findings. No pleural effusion or pneumothorax is seen. Heart size and mediastinal contours are stable. Osseous structures about the chest are unremarkable. IMPRESSION: No active disease. Stable bibasilar scarring/fibrosis. Electronically Signed   By: Franki Cabot M.D.   On: 12/02/2019 14:56    EKG: Independently reviewed.   Assessment/Plan  Acute submassive bilateral pulmonary emboli  Patient evaluated by critical care in the ED and no indication for TPA or catheter directed anticoagulation.  Patient will continue with heparin drip and will need to transition to Lovenox once stable  Likely will need lifelong anticoagulation with prior malignancy history and previous DVT  Obtain echocardiogram due to signs of right heart strain  Obtain  venous Doppler ultrasound of the lower extremity  Continue to trend troponin until peak   Acute hypoxemic respiratory failure secondary to pulmonary embolism in the setting of severe emphysema/fibrosis  Critical care has added Incruse daily and Dulera BID for COPD   Resume Trelegy at discharge PRN duonebs q6hrs  Maintain goal O2 of 88 to 92%   Rheumatoid arthritis  Continue chronic 2.5 mg prednisone  Continue 3 times daily home Norco for pain  Hypertension Patient was hypotensive on arrival to the ED.  Will hold 5 mg amlodipine for now  Depression Continue Effexor, Klonopin  Pressure ulcers Son endorsed seeing erythema on his buttocks and around his rectal region.  However patient did not want to be turned during exam.  Will have wound care per RN  Severe protein calorie malnutrition Consult dietician   DVT prophylaxis: heparin drip Code Status: DNR Family Communication: Plan discussed with patient at bedside and son over the phone disposition Plan: Home with at least 2 midnight stays  Consults called:  Admission status: inpatient   The appropriate patient status for this patient  is INPATIENT. Inpatient status is judged to be reasonable and necessary in order to provide the required intensity of service to ensure the patient's safety. The patient's presenting symptoms, physical exam findings, and initial radiographic and laboratory data in the context of their chronic comorbidities is felt to place them at high risk for further clinical deterioration. Furthermore, it is not anticipated that the patient will be medically stable for discharge from the hospital within 2 midnights of admission. The following factors support the patient  status of inpatient.    Patient presented with acute hypoxemic respiratory failure secondary to bilateral segmental pulmonary embolism and in the setting of COPD/fibrosis.  Patient also is frail and bedbound followed by palliative care outpatient.     * I certify that at the point of admission it is my clinical judgment that the patient will require inpatient hospital care spanning beyond 2 midnights from the point of admission due to high intensity of service, high risk for further deterioration and high frequency of surveillance required.Royal Piedra T Sherie Dobrowolski DO [facility]   If 7PM-7AM, please contact night-coverage [email or web address]   12/02/2019, 9:23 PM

## 2019-12-02 NOTE — ED Notes (Signed)
Gave report to Dawson. Will transport to [id number] presently.

## 2019-12-02 NOTE — ED Triage Notes (Signed)
Patient arrived by EMS from home. Patient c/o SOB.   Patient SpO2 dropped to 50's per Ems.   Patient placed on non-rebreather 15L. Patient now SpO2 86%.   Patient has history of pulmonary fibrosis, lung cancer, and COPD.

## 2019-12-02 NOTE — ED Provider Notes (Signed)
[facility] DEPT Provider Note   CSN: [id number] Arrival date & time: 12/02/19  1321     History Chief Complaint  Patient presents with  . Shortness of Breath    Kenneth [name] is a 74 y.o. male.  HPI   Patient has history of several medical problems including severe COPD, interstitial fibrosis and emphysema.  Patient's condition has declined in the last several months.  Patient is currently followed by palliative care at home.  Patient is DNR and indicates that he would not want to be intubated if that was the only thing keeping him alive.  Patient was at home today and in his chronic state of poor health.  He states he was getting rolled over in bed and shortly after that he started becoming acutely short of breath.  EMS was called and they noted his oxygen saturation to be significantly diminished.  He was started on a nonrebreather.  Patient states that has helped somewhat.  He denies any fevers or chills.  He denies any chest pain.  Patient has poor appetite but that has been chronic.  No vomiting or diarrhea.  No known leg swelling.  Past Medical History:  Diagnosis Date  . Abnormal LFTs   . Anxiety   . COPD (chronic obstructive pulmonary disease) (HCC)    emphysema.  . CTS (carpal tunnel syndrome)    Left  . DDD (degenerative disc disease)   . Depression    mild  . Elevated MCV   . Emphysema of lung ([place])   . Gout   . Gynecomastia   . Hearing loss   . History of radiation therapy 10/17/17-10/24/17   left lung treated to 54 Gy in 3 fractions  . Hyperlipidemia   . Microhematuria   . Nephrolithiasis   . Ocular migraine   . Panic attack   . Rheumatoid arthritis(714.0) dx 2000  . Situational hypertension   . Tobacco abuse     Patient Active Problem List   Diagnosis Date Noted  . Malnutrition of moderate degree 06/11/2018  . Pancytopenia ([place]) 06/09/2018  . HLD (hyperlipidemia) 06/09/2018  . Rheumatoid arthritis ([place]) 06/09/2018  .  Cellulitis of right leg 06/09/2018  . Anemia due to folic acid deficiency 93/26/7124  . Non-small cell cancer of left lung ([place]) 09/05/2017  . Lung nodule 08/01/2017  . Protein-calorie malnutrition, severe 04/30/2016  . Esophagitis 04/29/2016  . Dysphagia 04/29/2016  . Odynophagia   . Abnormal loss of weight   . Emphysema 09/28/2011  . Interstitial fibrosis 09/28/2011    Past Surgical History:  Procedure Laterality Date  . APPENDECTOMY    . COLONOSCOPY    . CYSTOSTOMY W/ BLADDER BIOPSY  2006  . ESOPHAGOGASTRODUODENOSCOPY N/A 04/30/2016   Procedure: ESOPHAGOGASTRODUODENOSCOPY (EGD);  Surgeon: Doran Stabler, MD;  Location: [facility];  Service: Endoscopy;  Laterality: N/A;       Family History  Problem Relation Age of Onset  . Lung cancer Father   . Asthma Paternal Grandmother   . Lung cancer Paternal Aunt   . Breast cancer Maternal Aunt   . Breast cancer Maternal Aunt   . Stomach cancer Maternal Uncle   . Colon cancer Neg Hx     Social History   Tobacco Use  . Smoking status: Former Smoker    Packs/day: 0.50    Years: 44.00    Pack years: 22.00    Types: Cigarettes    Quit date: 12/05/2017    Years since quitting:  1.9  . Smokeless tobacco: Never Used  . Tobacco comment: "quit cold [place]"  Substance Use Topics  . Alcohol use: No  . Drug use: No    Home Medications Prior to Admission medications   Medication Sig Start Date End Date Taking? Authorizing Provider  albuterol (VENTOLIN HFA) 108 (90 Base) MCG/ACT inhaler Inhale 1-2 puffs into the lungs every 6 (six) hours as needed for wheezing or shortness of breath.   Yes [provider]  amLODipine (NORVASC) 5 MG tablet Take 5 mg by mouth every evening.  08/06/17  Yes [provider]  cholecalciferol (VITAMIN D) 1000 UNITS tablet Take 1,000 Units by mouth daily.     Yes [provider]  clonazePAM (KLONOPIN) 0.5 MG tablet Take 0.5 mg by mouth 3 (three) times daily.    Yes [provider]  docusate sodium (COLACE) 100 MG capsule Take 100 mg by mouth daily.   Yes [provider]  Fluticasone-Umeclidin-Vilant (TRELEGY ELLIPTA) 100-62.5-25 MCG/INH AEPB Inhale 1 puff into the lungs every evening.    Yes [provider]  folic acid (FOLVITE) 1 MG tablet Take 5 tablets (5 mg total) by mouth daily. Patient taking differently: Take 6 mg by mouth daily.  06/16/18  Yes Aline August, MD  HYDROcodone-acetaminophen (NORCO/VICODIN) 5-325 MG tablet Take 1 tablet by mouth 3 (three) times daily.    Yes [provider]  potassium chloride SA (KLOR-CON) 20 MEQ tablet Take 20 mEq by mouth daily.   Yes [provider]  predniSONE (DELTASONE) 1 MG tablet Take 2.5 mg by mouth daily. 05/18/18  Yes [provider]  triamcinolone cream (KENALOG) 0.1 % Apply 1 application topically daily as needed for itching. 10/16/19  Yes [provider]  valACYclovir (VALTREX) 500 MG tablet Take 500 mg by mouth 2 (two) times daily.    Yes [provider]  venlafaxine XR (EFFEXOR-XR) 37.5 MG 24 hr capsule Take 37.5 mg by mouth at bedtime. 08/07/19  Yes [provider]  vitamin B-12 1000 MCG tablet Take 1 tablet (1,000 mcg total) by mouth daily. 06/16/18  Yes Aline August, MD    Allergies    Other, Aspirin, and Naproxen sodium  Review of Systems   Review of Systems  All other systems reviewed and are negative.   Physical Exam Updated Vital Signs BP (!) 126/91 (BP Location: Right Arm)   Pulse 92   Temp 98.8 F (37.1 C)   Resp (!) 27   Ht 1.778 m (5\' 10" )   Wt 68 kg   SpO2 97%   BMI 21.52 kg/m   Physical Exam Vitals and nursing note reviewed.  Constitutional:      Appearance: He is ill-appearing.     Comments: Underweight  HENT:     Head: Normocephalic and atraumatic.     Right Ear: External ear normal.     Left Ear: External ear normal.  Eyes:     General: No scleral icterus.       Right eye: No discharge.         Left eye: No discharge.     Conjunctiva/sclera: Conjunctivae normal.  Neck:     Trachea: No tracheal deviation.  Cardiovascular:     Rate and Rhythm: Regular rhythm. Tachycardia present.  Pulmonary:     Effort: Pulmonary effort is normal. No respiratory distress.     Breath sounds: No stridor. Rales present. No wheezing.     Comments: Diffuse crackles Abdominal:     General: Bowel  sounds are normal. There is no distension.     Palpations: Abdomen is soft.     Tenderness: There is no abdominal tenderness. There is no guarding or rebound.  Musculoskeletal:        General: No tenderness.     Cervical back: Neck supple.     Right lower leg: No edema.     Left lower leg: No edema.  Skin:    General: Skin is warm and dry.     Findings: No rash.  Neurological:     Mental Status: He is alert.     Cranial Nerves: No cranial nerve deficit (no facial droop, extraocular movements intact, no slurred speech).     Sensory: No sensory deficit.     Motor: No abnormal muscle tone or seizure activity.     Coordination: Coordination normal.     ED Results / Procedures / Treatments   Labs (all labs ordered are listed, but only abnormal results are displayed) Labs Reviewed  BASIC METABOLIC PANEL - Abnormal; Notable for the following components:      Result Value   Glucose, Bld 145 (*)    All other components within normal limits  CBC - Abnormal; Notable for the following components:   WBC 12.1 (*)    RBC 4.03 (*)    MCV 118.6 (*)    MCH 38.7 (*)    All other components within normal limits  D-DIMER, QUANTITATIVE (NOT AT [facility]) - Abnormal; Notable for the following components:   D-Dimer, Quant >20.00 (*)    All other components within normal limits  BRAIN NATRIURETIC PEPTIDE - Abnormal; Notable for the following components:   B Natriuretic Peptide 353.4 (*)    All other components within normal limits  TROPONIN I (HIGH SENSITIVITY) - Abnormal; Notable for the following components:    Troponin I (High Sensitivity) 266 (*)    All other components within normal limits  RESPIRATORY PANEL BY RT PCR (FLU A&B, COVID)  PROTIME-INR  APTT  POC SARS CORONAVIRUS 2 AG -  ED    EKG None  Radiology DG Chest Port 1 View  Result Date: 12/02/2019 CLINICAL DATA:  Dyspnea, low O2 sats. EXAM: PORTABLE CHEST 1 VIEW COMPARISON:  Chest x-rays dated 10/01/2019 and 08/29/2019. FINDINGS: Stable bibasilar scarring/fibrosis. No new lung findings. No pleural effusion or pneumothorax is seen. Heart size and mediastinal contours are stable. Osseous structures about the chest are unremarkable. IMPRESSION: No active disease. Stable bibasilar scarring/fibrosis. Electronically Signed   By: Franki Cabot M.D.   On: 12/02/2019 14:56    Procedures .Critical Care Performed by: Dorie Rank, MD Authorized by: Dorie Rank, MD   Critical care provider statement:    Critical care time (minutes):  45   Critical care was time spent personally by me on the following activities:  Discussions with consultants, evaluation of patient's response to treatment, examination of patient, ordering and performing treatments and interventions, ordering and review of laboratory studies, ordering and review of radiographic studies, pulse oximetry, re-evaluation of patient's condition, obtaining history from patient or surrogate and review of old charts   (including critical care time)  Medications Ordered in ED Medications  sodium chloride (PF) 0.9 % injection (has no administration in time range)  heparin bolus via infusion 2,200 Units (2,200 Units Intravenous Bolus from Bag 12/02/19 1735)    Followed by  heparin ADULT infusion 100 units/mL (25000 units/268mL sodium chloride 0.45%) (1,200 Units/hr Intravenous New Bag/Given 12/02/19 1735)  iohexol (OMNIPAQUE) 350 MG/ML injection 80  mL (80 mLs Intravenous Contrast Given 12/02/19 1650)    ED Course  I have reviewed the triage vital signs and the nursing notes.  Pertinent  labs & imaging results that were available during my care of the patient were reviewed by me and considered in my medical decision making (see chart for details).  Clinical Course as of Dec 01 1753  Nancy Fetter Dec 02, 2019  1538 Chest x-ray does not show acute finding.  D-dimer and troponin are significantly elevated.  I doubt ACS and I am concerned about the possibility of acute PE.  We will proceed with CT angiogram.   [JK]  40 CT shows bilateral PE, early heart strain.      [HQ]  1975 OITGPQ remains stable except for tachypnea.   Pt likely would benefit from echocardiogram.   Will consult for admission.   [DI]  2641 Discussed with PCCM. Agrees with heparin treatment for now.     [JK]    Clinical Course User Index [JK] Dorie Rank, MD   MDM Rules/Calculators/A&P                      Patient presented to the emergency room for evaluation of acute shortness of breath.  Patient does have history of pulmonary fibrosis lung cancer COPD.  He is being followed by palliative care.  Patient symptoms were concerning for the possibility of pulmonary embolism.  He had increasing oxygen requirements and was notably tachypneic.  Patient's laboratory tests do show evidence of heart strain with elevated troponin.  Patient CT scan suggest the possibility of early heart strain as well.  Patient was started on heparin.  Pulmonary critical care was consulted.  Plan admission to the medical service.  I have updated the patient and his family. Final Clinical Impression(s) / ED Diagnoses Final diagnoses:  Acute pulmonary embolism, unspecified pulmonary embolism type, unspecified whether acute cor pulmonale present [facility])     Dorie Rank, MD 12/02/19 1755

## 2019-12-02 NOTE — Consult Note (Signed)
NAME:  Kenneth [name], MRN:  [id number], DOB:  Feb 09, 1946, LOS: 0 ADMISSION DATE:  12/02/2019, CONSULTATION DATE:  12/02/19 REFERRING MD:  Marye Round, MD CHIEF COMPLAINT:  Pulmonary emboli  Brief History   74 year old male admitted for acute hypoxemic respiratory failure secondary to bilateral segmental pulmonary emboli  History of present illness   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 At baseline, he is bedbound due to chronic medical problems including weakness and arthritis per patient. He lives with his wife and son who cares for him. During turning in bed, he reported sudden onset of shortness of breath. Denied wheezing or cough. He does have chronic dyspnea related to his emphysema and takes Trelegy.   He reports prior history of LLE DVT on Eliquis. Per patient completed treatment? However with his cancer history unclear on reason for discontinuation (no prior GIB etc)  In the ED, he is mildly tachypneic but seems comfortable on 5L O2 while talking. High normal HR and BP 124/95. He confirms his DNR status but wishes to receive medical treatment as indicated.   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 Severe emphysema and bibasilar fibrosis  Micro Data:    Antimicrobials:     Interim history/subjective:  As above  Objective   Blood pressure (!) 126/91, pulse 92, temperature 98.8 F (37.1 C),  resp. rate (!) 27, height 5\' 10"  (1.778 m), weight 68 kg, SpO2 97 %.       No intake or output data in the 24 hours ending 12/02/19 1745 Filed Weights   12/02/19 1343  Weight: 68 kg   Physical Exam: General: Chronically, elderly-appearing, no acute distress HENT: [place], AT, OP clear, MMM Eyes: EOMI, no scleral icterus Respiratory: Diminished breath sounds bilaterally.  No crackles, wheezing or rales Cardiovascular: RRR, -M/R/G, no JVD GI: BS+, soft, nontender Extremities:-Edema,-tenderness Neuro: AAO x4, CNII-XII grossly intact Skin: Intact, no rashes or bruising Psych: Normal mood, normal affect  Resolved Hospital Problem list     Assessment & Plan:   Acute submassive bilateral segmental pulmonary emboli High risk based on PESI score (4-11% 30-day mortality) Hemodynamically stable Will likely need lifelong anticoagulation with prior malignancy history and previous DVT --Agree with systemic anticoagulation. No indication for tPA or catheter-directed AC. Transition to lovenox when stable --Please obtain lower extremity dopplers and echocardiogram --Trend troponin until peaked --Pain control per primary team  Acute hypoxemic respiratory failure secondary to PE Severe emphysema/fibrosis --Anticoagulation as above --Wean supplemental O2 for goal SpO2 88-92%. Will need ambulatory O2 prior to discharge --Scheduled duonebs q6h as needed --Start Incruse daily and Dulera BID for COPD/emphysema. Resume home Trelegy at discharge  Rheumatoid arthritis No longer on methotrexate but steroid dependent --Would recommend continuing home prednisone 2.5 mg daily  GOC Palliative care as outpatient DNR status  Pulmonary available as needed. Please call for any questions or concerns.  Best practice:  Diet: Per primary Pain/Anxiety/Delirium protocol (if indicated): Per  primary VAP protocol (if indicated): -- DVT prophylaxis: Systemic anticoagulation GI prophylaxis: Per primary Glucose  control: Per primary Mobility: As tolerated Code Status: DNR Family Communication: Per primary Disposition: Admitted to [facility]  Labs   CBC: Recent Labs  Lab 12/02/19 1345  WBC 12.1*  HGB 15.6  HCT 47.8  MCV 118.6*  PLT 448    Basic Metabolic Panel: Recent Labs  Lab 12/02/19 1345  NA 141  K 3.8  CL 104  CO2 26  GLUCOSE 145*  BUN 16  CREATININE 0.97  CALCIUM 9.4   GFR: Estimated Creatinine Clearance: 66.2 mL/min (by C-G formula based on SCr of 0.97 mg/dL). Recent Labs  Lab 12/02/19 1345  WBC 12.1*    Liver Function Tests: No results for input(s): AST, ALT, ALKPHOS, BILITOT, PROT, ALBUMIN in the last 168 hours. No results for input(s): LIPASE, AMYLASE in the last 168 hours. No results for input(s): AMMONIA in the last 168 hours.  ABG No results found for: PHART, PCO2ART, PO2ART, HCO3, TCO2, ACIDBASEDEF, O2SAT   Coagulation Profile: Recent Labs  Lab 12/02/19 1630  INR 0.9    Cardiac Enzymes: No results for input(s): CKTOTAL, CKMB, CKMBINDEX, TROPONINI in the last 168 hours.  HbA1C: No results found for: HGBA1C  CBG: No results for input(s): GLUCAP in the last 168 hours.  Review of Systems:   Review of Systems  Constitutional: Positive for malaise/fatigue. Negative for chills, diaphoresis, fever and weight loss.  HENT: Negative for congestion, ear pain and sore throat.   Respiratory: Positive for cough and shortness of breath. Negative for hemoptysis, sputum production and wheezing.   Cardiovascular: Negative for chest pain, palpitations and leg swelling.  Gastrointestinal: Negative for abdominal pain, heartburn and nausea.  Genitourinary: Negative for frequency.  Musculoskeletal: Negative for joint pain and myalgias.  Skin: Negative for itching and rash.  Neurological: Positive for weakness. Negative for dizziness, focal weakness and headaches.  Endo/Heme/Allergies: Bruises/bleeds easily.  Psychiatric/Behavioral: Negative for depression. The patient  is not nervous/anxious.      Past Medical History  He,  has a past medical history of Abnormal LFTs, Anxiety, COPD (chronic obstructive pulmonary disease) ([place]), CTS (carpal tunnel syndrome), DDD (degenerative disc disease), Depression, Elevated MCV, Emphysema of lung ([place]), Gout, Gynecomastia, Hearing loss, History of radiation therapy (10/17/17-10/24/17), Hyperlipidemia, Microhematuria, Nephrolithiasis, Ocular migraine, Panic attack, Rheumatoid arthritis(714.0) (dx 2000), Situational hypertension, and Tobacco abuse.   Surgical History    Past Surgical History:  Procedure Laterality Date  . APPENDECTOMY    . COLONOSCOPY    . CYSTOSTOMY W/ BLADDER BIOPSY  2006  . ESOPHAGOGASTRODUODENOSCOPY N/A 04/30/2016   Procedure: ESOPHAGOGASTRODUODENOSCOPY (EGD);  Surgeon: Doran Stabler, MD;  Location: [facility];  Service: Endoscopy;  Laterality: N/A;     Social History   reports that he quit smoking about 1 years ago. His smoking use included cigarettes. He has a 22.00 pack-year smoking history. He has never used smokeless tobacco. He reports that he does not drink alcohol or use drugs.   Family History   His family history includes Asthma in his paternal grandmother; Breast cancer in his maternal aunt and maternal aunt; Lung cancer in his father and paternal aunt; Stomach cancer in his maternal uncle. There is no history of Colon cancer.   Allergies Allergies  Allergen Reactions  . Other Other (See Comments)    "CILLIN" Family Has patient had a PCN reaction causing immediate rash, facial/tongue/throat swelling, SOB or lightheadedness with hypotension: no Has patient had a PCN reaction causing  severe rash involving mucus membranes or skin necrosis: yes Has patient had a PCN reaction that required hospitalization: no Has patient had a PCN reaction occurring within the last 10 years: no If all of the above answers are "NO", then may proceed with Cephalosporin use.   . Aspirin Other (See  Comments)    Gi upset  . Naproxen Sodium Other (See Comments)    Whelps in large doses **ALEVE**     Home Medications  Prior to Admission medications   Medication Sig Start Date End Date Taking? Authorizing Provider  albuterol (VENTOLIN HFA) 108 (90 Base) MCG/ACT inhaler Inhale 1-2 puffs into the lungs every 6 (six) hours as needed for wheezing or shortness of breath.   Yes [provider]  amLODipine (NORVASC) 5 MG tablet Take 5 mg by mouth every evening.  08/06/17  Yes [provider]  cholecalciferol (VITAMIN D) 1000 UNITS tablet Take 1,000 Units by mouth daily.     Yes [provider]  clonazePAM (KLONOPIN) 0.5 MG tablet Take 0.5 mg by mouth 3 (three) times daily.    Yes [provider]  docusate sodium (COLACE) 100 MG capsule Take 100 mg by mouth daily.   Yes [provider]  Fluticasone-Umeclidin-Vilant (TRELEGY ELLIPTA) 100-62.5-25 MCG/INH AEPB Inhale 1 puff into the lungs every evening.    Yes [provider]  folic acid (FOLVITE) 1 MG tablet Take 5 tablets (5 mg total) by mouth daily. Patient taking differently: Take 6 mg by mouth daily.  06/16/18  Yes Aline August, MD  HYDROcodone-acetaminophen (NORCO/VICODIN) 5-325 MG tablet Take 1 tablet by mouth 3 (three) times daily.    Yes [provider]  potassium chloride SA (KLOR-CON) 20 MEQ tablet Take 20 mEq by mouth daily.   Yes [provider]  predniSONE (DELTASONE) 1 MG tablet Take 2.5 mg by mouth daily. 05/18/18  Yes [provider]  triamcinolone cream (KENALOG) 0.1 % Apply 1 application topically daily as needed for itching. 10/16/19  Yes [provider]  valACYclovir (VALTREX) 500 MG tablet Take 500 mg by mouth 2 (two) times daily.    Yes [provider]  venlafaxine XR (EFFEXOR-XR) 37.5 MG 24 hr capsule Take 37.5 mg by mouth at bedtime. 08/07/19  Yes [provider]  vitamin B-12 1000 MCG tablet Take 1 tablet (1,000 mcg  total) by mouth daily. 06/16/18  Yes Aline August, MD    Rodman Pickle, M.D. [facility] Pulmonary/Critical Care Medicine 12/02/2019 5:46 PM

## 2019-12-03 ENCOUNTER — Inpatient Hospital Stay (HOSPITAL_COMMUNITY): Payer: Medicare Other

## 2019-12-03 DIAGNOSIS — I2602 Saddle embolus of pulmonary artery with acute cor pulmonale: Secondary | ICD-10-CM

## 2019-12-03 DIAGNOSIS — I2699 Other pulmonary embolism without acute cor pulmonale: Secondary | ICD-10-CM

## 2019-12-03 DIAGNOSIS — Z66 Do not resuscitate: Secondary | ICD-10-CM

## 2019-12-03 LAB — BASIC METABOLIC PANEL
Anion gap: 9 (ref 5–15)
BUN: 14 mg/dL (ref 8–23)
CO2: 25 mmol/L (ref 22–32)
Calcium: 9 mg/dL (ref 8.9–10.3)
Chloride: 105 mmol/L (ref 98–111)
Creatinine, Ser: 0.95 mg/dL (ref 0.61–1.24)
GFR calc Af Amer: >60 mL/min (ref >60)
GFR calc non Af Amer: >60 mL/min (ref >60)
Glucose, Bld: 120 mg/dL — ABNORMAL HIGH (ref 70–99)
Potassium: 4.9 mmol/L (ref 3.5–5.1)
Sodium: 139 mmol/L (ref 135–145)

## 2019-12-03 LAB — HEPARIN LEVEL (UNFRACTIONATED)
Heparin Unfractionated: 1.16 IU/mL — ABNORMAL HIGH (ref 0.30–0.70)
Heparin Unfractionated: 0.85 IU/mL — ABNORMAL HIGH (ref 0.30–0.70)
Heparin Unfractionated: 0.45 IU/mL (ref 0.30–0.70)

## 2019-12-03 LAB — CBC
HCT: 44 % (ref 39.0–52.0)
Hemoglobin: 14.5 g/dL (ref 13.0–17.0)
MCH: 38.8 pg — ABNORMAL HIGH (ref 26.0–34.0)
MCHC: 33 g/dL (ref 30.0–36.0)
MCV: 117.6 fL — ABNORMAL HIGH (ref 80.0–100.0)
Platelets: 129 10*3/uL — ABNORMAL LOW (ref 150–400)
RBC: 3.74 MIL/uL — ABNORMAL LOW (ref 4.22–5.81)
RDW: 14.6 % (ref 11.5–15.5)
WBC: 10.4 10*3/uL (ref 4.0–10.5)
nRBC: 0 % (ref 0.0–0.2)

## 2019-12-03 LAB — ECHOCARDIOGRAM COMPLETE
Height: 65 in
Weight: 2215.18 oz

## 2019-12-03 LAB — TROPONIN I (HIGH SENSITIVITY): Troponin I (High Sensitivity): 516 ng/L (ref <18)

## 2019-12-03 MED ORDER — ALBUTEROL SULFATE (2.5 MG/3ML) 0.083% IN NEBU
2.5000 mg | INHALATION_SOLUTION | Freq: Three times a day (TID) | RESPIRATORY_TRACT | Status: DC
  Administered 2019-12-04 – 2019-12-05 (×6): 2.5 mg via RESPIRATORY_TRACT
  Filled 2019-12-03 (×6): qty 3

## 2019-12-03 MED ORDER — HEPARIN (PORCINE) 25000 UT/250ML-% IV SOLN
1000.0000 [IU]/h | INTRAVENOUS | Status: DC
  Administered 2019-12-03 (×2): 1000 [IU]/h via INTRAVENOUS
  Filled 2019-12-03: qty 250

## 2019-12-03 MED ORDER — HEPARIN (PORCINE) 25000 UT/250ML-% IV SOLN
800.0000 [IU]/h | INTRAVENOUS | Status: DC
  Administered 2019-12-04: 800 [IU]/h via INTRAVENOUS
  Filled 2019-12-03: qty 250

## 2019-12-03 MED ORDER — PERFLUTREN LIPID MICROSPHERE
1.0000 mL | INTRAVENOUS | Status: AC | PRN
  Administered 2019-12-03: 1 mL via INTRAVENOUS
  Filled 2019-12-03: qty 10

## 2019-12-03 NOTE — Progress Notes (Signed)
ANTICOAGULATION CONSULT NOTE - Follow Up Consult  Pharmacy Consult for Heparin Indication: pulmonary embolus  Allergies  Allergen Reactions  . Other Other (See Comments)    "CILLIN" Family Has patient had a PCN reaction causing immediate rash, facial/tongue/throat swelling, SOB or lightheadedness with hypotension: no Has patient had a PCN reaction causing severe rash involving mucus membranes or skin necrosis: yes Has patient had a PCN reaction that required hospitalization: no Has patient had a PCN reaction occurring within the last 10 years: no If all of the above answers are "NO", then may proceed with Cephalosporin use.   . Aspirin Other (See Comments)    Gi upset  . Naproxen Sodium Other (See Comments)    Whelps in large doses **ALEVE**    Patient Measurements: Height: 5\' 5"  (165.1 cm) Weight: 138 lb 7.2 oz (62.8 kg) IBW/kg (Calculated) : 61.5 Heparin Dosing Weight:   Vital Signs: Temp: 97.8 F (36.6 C) (02/14 2319) Temp Source: Oral (02/14 2319) BP: 132/74 (02/14 2319) Pulse Rate: 56 (02/14 2319)  Labs: Recent Labs    12/02/19 1345 12/02/19 1630 12/02/19 2130 12/03/19 0003 12/03/19 0210  HGB 15.6  --   --   --  14.5  HCT 47.8  --   --   --  44.0  PLT 150  --   --   --  129*  APTT  --  32  --   --   --   LABPROT  --  12.4  --   --   --   INR  --  0.9  --   --   --   HEPARINUNFRC  --   --   --   --  1.16*  CREATININE 0.97  --   --   --  0.95  TROPONINIHS 266*  --  690* 516*  --     Estimated Creatinine Clearance: 61.1 mL/min (by C-G formula based on SCr of 0.95 mg/dL).   Medications:  Infusions:  . heparin      Assessment: Patient with high heparin level.  RN reports no issues with heparin issues.  However, RN taking care of patient was off the floor with another patient, therefore called charge RN to stop heparin.  Goal of Therapy:  Heparin level 0.3-0.7 units/ml Monitor platelets by anticoagulation protocol: Yes   Plan:  Hold heparin for  52mins--charge RN to do this Restart heparin at 1000 units/hr at 0415 Heparin level at [address] [place] 12/03/2019,3:50 AM

## 2019-12-03 NOTE — Progress Notes (Signed)
  Echocardiogram 2D Echocardiogram has been performed.  Oneal Deputy Macallan Ord 12/03/2019, 9:28 AM

## 2019-12-03 NOTE — Progress Notes (Signed)
ANTICOAGULATION CONSULT NOTE - Follow Up Consult  Pharmacy Consult for Heparin Indication: pulmonary embolus  Allergies  Allergen Reactions  . Other Other (See Comments)    "CILLIN" Family Has patient had a PCN reaction causing immediate rash, facial/tongue/throat swelling, SOB or lightheadedness with hypotension: no Has patient had a PCN reaction causing severe rash involving mucus membranes or skin necrosis: yes Has patient had a PCN reaction that required hospitalization: no Has patient had a PCN reaction occurring within the last 10 years: no If all of the above answers are "NO", then may proceed with Cephalosporin use.   . Aspirin Other (See Comments)    Gi upset  . Naproxen Sodium Other (See Comments)    Whelps in large doses **ALEVE**    Patient Measurements: Height: 5\' 10"  (177.8 cm) Weight: 138 lb 7.2 oz (62.8 kg) IBW/kg (Calculated) : 73 Heparin Dosing Weight:   Vital Signs: Temp: 98.7 F (37.1 C) (02/15 0545) BP: 129/80 (02/15 0545) Pulse Rate: 88 (02/15 0545)  Labs: Recent Labs    12/02/19 1345 12/02/19 1630 12/02/19 2130 12/03/19 0003 12/03/19 0210 12/03/19 1223  HGB 15.6  --   --   --  14.5  --   HCT 47.8  --   --   --  44.0  --   PLT 150  --   --   --  129*  --   APTT  --  32  --   --   --   --   LABPROT  --  12.4  --   --   --   --   INR  --  0.9  --   --   --   --   HEPARINUNFRC  --   --   --   --  1.16* 0.85*  CREATININE 0.97  --   --   --  0.95  --   TROPONINIHS 266*  --  690* 516*  --   --     Estimated Creatinine Clearance: 62.4 mL/min (by C-G formula based on SCr of 0.95 mg/dL).   Medications:  Infusions:  . heparin 1,000 Units/hr (12/03/19 1328)    Assessment: 73 y/oM who presented to [facility] ED on 12/02/2019 with shortness of breath. D-dimer > 20. CTa chest + for bilateral segmental PE with early right heart strain. Pharmacy consulted for IV heparin dosing. Patient has history of LLE DVT for which he was on Eliquis, currently not on  anticoagulation PTA per med list. H/H, Pltc WNL.   Today, 12/03/19  HL is 0.85  Hgb 14.5, plt 129  SCr 0.95 mg/dl   Goal of Therapy:  Heparin level 0.3-0.7 units/ml Monitor platelets by anticoagulation protocol: Yes   Plan:   Decrease  heparin to 800 units/hr   Obtain HL 8 hours after rate change  Daily HL  Daily CBC while on heparin drip  Monitor for signs and symptoms of bleeding    Royetta Asal, PharmD, BCPS 12/03/2019 1:46 PM

## 2019-12-03 NOTE — Progress Notes (Signed)
Bilateral lower extremity venous duplex has been completed. Preliminary results can be found in CV Proc through chart review.  Results were given to [employer].  12/03/19 9:32 AM Carlos Levering RVT

## 2019-12-03 NOTE — Plan of Care (Signed)

## 2019-12-03 NOTE — Progress Notes (Addendum)
PROGRESS NOTE    Kenneth [name]  OZH:[id number] DOB: [birth year] DOA: 12/02/2019 PCP: [name], MD    Brief Narrative:  74 y.o. male with medical history significant for RA previously on methotrexate, severe emphysema, non-small cell lung cancer s/p radiation in 2018, history of left lower extremity DVT who presented to the ED for increasing shortness of breath. History mostly obtained from son and ER physician given patient was not a great historian.  Son was helping to turn him to lay on his right side today to evaluate some pressure ulcers when patient developed sudden acute onset of shortness of breath and tachypnea.  Patient has been bedbound since October 2020 since he became progressively weak and had recurrent falls.  He is currently followed by palliative care outpatient.   In the ER, he was afebrile, tachycardic and tachypnea initially requiring 15L NRB but was able to wean down to 4L via [place].   D-dimer greater than 20.  CTA chest revealing for bilateral segmental pulmonary emboli with signs of right heart strain.  Troponin elevated to 266. Negative COVID and flu PCR test  He was evaluated by critical care in the ED and no indication for TPA or catheter directed anticoagulation was recommended.  Patient was started on heparin drip and asked to be admitted by hospitalist for further work-up.  Assessment & Plan:   Principal Problem:   Acute pulmonary embolism (HCC) Active Problems:   COPD with chronic bronchitis (HCC)   Protein-calorie malnutrition, severe   Rheumatoid arthritis ([place])   Respiratory failure with hypoxia ([place])   DNR (do not resuscitate)   Acute submassive bilateral pulmonary emboli with BLE DVT -Patient evaluated by critical care in the ED and no indication for TPA or catheter directed anticoagulation.  -CT personally reviewed. Findings of PE with evidence of R heart strain -Patient is continued on heparin gtt. Discussed case with Hematology who  recommends transition to [place] when transitioning off heparin.  -given clot burden, will plan to treat at least 48hrs of IV heparin -2d echo performed, finding of moderately reduced RV function -BLE dopplers reviewed, findings of acute DVT involving L femoral vein, L popliteal vein, L post tibial veins, L peroneal veins  Acute hypoxemic respiratory failure secondary to pulmonary embolism in the setting of severe emphysema/fibrosis  -Critical care added Incruse daily and Dulera BID for COPD   -Resume Trelegy at discharge -Continue with PRN duonebs q6hrs   Rheumatoid arthritis  -Continue chronic 2.5 mg prednisone as tolerated -Continue 3 times daily home Norco for pain  Hypertension -Patient was hypotensive on arrival to the ED.   -Home norvasc currently on hold  Depression -Continue Effexor, Klonopin as pt tolerates -Seems stable at this time  Pressure ulcers -Son endorsed seeing erythema on his buttocks and around his rectal region.  -Frequent turning  Severe protein calorie malnutrition -Consulted dietician   DVT prophylaxis: On therapeutic heparin gtt Code Status: DNR Family Communication: Pt in room, family at bedside Disposition Plan: From home, anticipate d/c home when transitioned off heparin gtt  Consultants:   PCCM  Discussed case with Hematology  Procedures:     Antimicrobials: Anti-infectives (From admission, onward)   Start     Dose/Rate Route Frequency Ordered Stop   12/02/19 2215  valACYclovir (VALTREX) tablet 500 mg     500 mg Oral 2 times daily 12/02/19 2204        Subjective: Denies sob or chest pain at this time  Objective: Vitals:   12/03/19 0748  12/03/19 1006 12/03/19 1400 12/03/19 1443  BP:   132/80   Pulse:   86   Resp:   16   Temp:   (!) 97.5 F (36.4 C)   TempSrc:   Oral   SpO2: 95%  100% 97%  Weight:      Height:  5\' 10"  (1.778 m)      Intake/Output Summary (Last 24 hours) at 12/03/2019 1550 Last data filed at  12/03/2019 1300 Gross per 24 hour  Intake 405.09 ml  Output 350 ml  Net 55.09 ml   Filed Weights   12/02/19 1343 12/02/19 2233  Weight: 68 kg 62.8 kg    Examination:  General exam: Appears calm and comfortable  Respiratory system: Clear to auscultation. Respiratory effort normal. Cardiovascular system: S1 & S2 heard, Regular Gastrointestinal system: Abdomen is nondistended, soft and nontender. No organomegaly or masses felt. Normal bowel sounds heard. Central nervous system: Alert and oriented. No focal neurological deficits. Extremities: Symmetric 5 x 5 power. Skin: No rashes, lesions, perfused Psychiatry: Judgement and insight appear normal. Mood & affect appropriate.   Data Reviewed: I have personally reviewed following labs and imaging studies  CBC: Recent Labs  Lab 12/02/19 1345 12/03/19 0210  WBC 12.1* 10.4  HGB 15.6 14.5  HCT 47.8 44.0  MCV 118.6* 117.6*  PLT 150 720*   Basic Metabolic Panel: Recent Labs  Lab 12/02/19 1345 12/03/19 0210  NA 141 139  K 3.8 4.9  CL 104 105  CO2 26 25  GLUCOSE 145* 120*  BUN 16 14  CREATININE 0.97 0.95  CALCIUM 9.4 9.0   GFR: Estimated Creatinine Clearance: 62.4 mL/min (by C-G formula based on SCr of 0.95 mg/dL). Liver Function Tests: No results for input(s): AST, ALT, ALKPHOS, BILITOT, PROT, ALBUMIN in the last 168 hours. No results for input(s): LIPASE, AMYLASE in the last 168 hours. No results for input(s): AMMONIA in the last 168 hours. Coagulation Profile: Recent Labs  Lab 12/02/19 1630  INR 0.9   Cardiac Enzymes: No results for input(s): CKTOTAL, CKMB, CKMBINDEX, TROPONINI in the last 168 hours. BNP (last 3 results) No results for input(s): PROBNP in the last 8760 hours. HbA1C: No results for input(s): HGBA1C in the last 72 hours. CBG: No results for input(s): GLUCAP in the last 168 hours. Lipid Profile: No results for input(s): CHOL, HDL, LDLCALC, TRIG, CHOLHDL, LDLDIRECT in the last 72 hours. Thyroid  Function Tests: No results for input(s): TSH, T4TOTAL, FREET4, T3FREE, THYROIDAB in the last 72 hours. Anemia Panel: No results for input(s): VITAMINB12, FOLATE, FERRITIN, TIBC, IRON, RETICCTPCT in the last 72 hours. Sepsis Labs: No results for input(s): PROCALCITON, LATICACIDVEN in the last 168 hours.  Recent Results (from the past 240 hour(s))  Respiratory Panel by RT PCR (Flu A&B, Covid) - Nasopharyngeal Swab     Status: None   Collection Time: 12/02/19  3:52 PM   Specimen: Nasopharyngeal Swab  Result Value Ref Range Status   SARS Coronavirus 2 by RT PCR NEGATIVE NEGATIVE Final    Comment: (NOTE) SARS-CoV-2 target nucleic acids are NOT DETECTED. The SARS-CoV-2 RNA is generally detectable in upper respiratoy specimens during the acute phase of infection. The lowest concentration of SARS-CoV-2 viral copies this assay can detect is 131 copies/mL. A negative result does not preclude SARS-Cov-2 infection and should not be used as the sole basis for treatment or other patient management decisions. A negative result may occur with  improper specimen collection/handling, submission of specimen other than nasopharyngeal swab, presence of  viral mutation(s) within the areas targeted by this assay, and inadequate number of viral copies (<131 copies/mL). A negative result must be combined with clinical observations, patient history, and epidemiological information. The expected result is Negative. Fact Sheet for Patients:  PinkCheek.be Fact Sheet for Healthcare Providers:  GravelBags.it This test is not yet ap proved or cleared by the [place] FDA and  has been authorized for detection and/or diagnosis of SARS-CoV-2 by FDA under an Emergency Use Authorization (EUA). This EUA will remain  in effect (meaning this test can be used) for the duration of the COVID-19 declaration under Section 564(b)(1) of the Act, 21 U.S.C. section  360bbb-3(b)(1), unless the authorization is terminated or revoked sooner.    Influenza A by PCR NEGATIVE NEGATIVE Final   Influenza B by PCR NEGATIVE NEGATIVE Final    Comment: (NOTE) The Xpert Xpress SARS-CoV-2/FLU/RSV assay is intended as an aid in  the diagnosis of influenza from Nasopharyngeal swab specimens and  should not be used as a sole basis for treatment. Nasal washings and  aspirates are unacceptable for Xpert Xpress SARS-CoV-2/FLU/RSV  testing. Fact Sheet for Patients: PinkCheek.be Fact Sheet for Healthcare Providers: GravelBags.it This test is not yet approved or cleared by the [place] FDA and  has been authorized for detection and/or diagnosis of SARS-CoV-2 by  FDA under an Emergency Use Authorization (EUA). This EUA will remain  in effect (meaning this test can be used) for the duration of the  Covid-19 declaration under Section 564(b)(1) of the Act, 21  U.S.C. section 360bbb-3(b)(1), unless the authorization is  terminated or revoked. Performed at [facility], [place] [address] [place], [place] [address]      Radiology Studies: CT Angio Chest PE W and/or Wo Contrast  Result Date: 12/02/2019 CLINICAL DATA:  Shortness of breath, elevated D-dimer, history of COPD EXAM: CT ANGIOGRAPHY CHEST WITH CONTRAST TECHNIQUE: Multidetector CT imaging of the chest was performed using the standard protocol during bolus administration of intravenous contrast. Multiplanar CT image reconstructions and MIPs were obtained to evaluate the vascular anatomy. CONTRAST:  35mL OMNIPAQUE IOHEXOL 350 MG/ML SOLN COMPARISON:  12/02/2019, 09/07/2019 FINDINGS: Cardiovascular: This is a technically adequate evaluation of the pulmonary vasculature. There are bilateral segmental pulmonary emboli greatest in the right upper and left lower lobe distributions. Moderate clot burden. There is straightening of the interventricular  septum with no frank right ventricular dilatation at this time. Thoracic aorta is normal in caliber. Mild atherosclerosis of the aorta and coronary vessels. No pericardial effusion. Mediastinum/Nodes: No pathologic mediastinal or hilar adenopathy. Lungs/Pleura: There is severe emphysema, with chronic bibasilar scarring and fibrosis. Increased consolidation within the right lower lobe is felt to be hypoventilatory rather than airspace disease or infarct. No effusion or pneumothorax. Central airways are patent. There is mucoid material within the bronchus intermedius. Upper Abdomen: Calcified gallstones are identified without cholecystitis. The remainder of the upper abdomen is unremarkable. Musculoskeletal: There are no acute displaced fractures. The reconstructed images demonstrate no additional findings. Review of the MIP images confirms the above findings. IMPRESSION: 1. Bilateral segmental pulmonary emboli. There is straightening of the interventricular septum consistent with early right heart strain. 2. Severe background emphysema, with bibasilar scarring and atelectasis. 3. Cholelithiasis without cholecystitis. These results were called by telephone at the time of interpretation on 12/02/2019 at 5:06 pm to provider [facility] , who verbally acknowledged these results. Electronically Signed   By: Randa Ngo M.D.   On: 12/02/2019 17:06   DG Chest [facility]  1 View  Result Date: 12/02/2019 CLINICAL DATA:  Dyspnea, low O2 sats. EXAM: PORTABLE CHEST 1 VIEW COMPARISON:  Chest x-rays dated 10/01/2019 and 08/29/2019. FINDINGS: Stable bibasilar scarring/fibrosis. No new lung findings. No pleural effusion or pneumothorax is seen. Heart size and mediastinal contours are stable. Osseous structures about the chest are unremarkable. IMPRESSION: No active disease. Stable bibasilar scarring/fibrosis. Electronically Signed   By: Franki Cabot M.D.   On: 12/02/2019 14:56   ECHOCARDIOGRAM COMPLETE  Result Date: 12/03/2019     ECHOCARDIOGRAM REPORT   Patient Name:   Kenneth [name] Date of Exam: 12/03/2019 Medical Rec #:  [id number]     Height:       70.0 in Accession #:    [id number]    Weight:       138.4 lb Date of Birth:  08-26-1946      BSA:          1.79 m Patient Age:    29 years      BP:           129/80 mmHg Patient Gender: M             HR:           93 bpm. Exam Location:  Inpatient Procedure: 2D Echo, Color Doppler and Cardiac Doppler Indications:    I26.02 Pulmonary embolus  History:        Patient has no prior history of Echocardiogram examinations.                 COPD; Risk Factors:Dyslipidemia.  Sonographer:    Raquel Sarna Senior RDCS Referring Phys: [id number] [place]  1. Left ventricular ejection fraction, by estimation, is 60 to 65%. The left ventricle has normal function. The left ventricle has no regional wall motion abnormalities. Left ventricular diastolic parameters are consistent with Grade I diastolic dysfunction (impaired relaxation).  2. Right ventricular systolic function is moderately reduced. The right ventricular size is moderately enlarged. There is normal pulmonary artery systolic pressure. The estimated right ventricular systolic pressure is 62.0 mmHg.  3. The mitral valve is grossly normal. No evidence of mitral valve regurgitation. No evidence of mitral stenosis.  4. The aortic valve is tricuspid. Aortic valve regurgitation is not visualized. No aortic stenosis is present.  5. The inferior vena cava is normal in size with greater than 50% respiratory variability, suggesting right atrial pressure of 3 mmHg. Comparison(s): No prior Echocardiogram. FINDINGS  Left Ventricle: Left ventricular ejection fraction, by estimation, is 60 to 65%. The left ventricle has normal function. The left ventricle has no regional wall motion abnormalities. The left ventricular internal cavity size was normal in size. There is  no left ventricular hypertrophy. Left ventricular diastolic parameters are consistent with Grade  I diastolic dysfunction (impaired relaxation). Right Ventricle: The right ventricular size is moderately enlarged. No increase in right ventricular wall thickness. Right ventricular systolic function is moderately reduced. There is normal pulmonary artery systolic pressure. The tricuspid regurgitant velocity is 2.76 m/s, and with an assumed right atrial pressure of 3 mmHg, the estimated right ventricular systolic pressure is 35.5 mmHg. Left Atrium: Left atrial size was normal in size. Right Atrium: Right atrial size was normal in size. Pericardium: There is no evidence of pericardial effusion. Mitral Valve: The mitral valve is grossly normal. No evidence of mitral valve regurgitation. No evidence of mitral valve stenosis. Tricuspid Valve: The tricuspid valve is grossly normal. Tricuspid valve regurgitation is not demonstrated. No evidence of tricuspid  stenosis. Aortic Valve: The aortic valve is tricuspid. Aortic valve regurgitation is not visualized. No aortic stenosis is present. Pulmonic Valve: The pulmonic valve was grossly normal. Pulmonic valve regurgitation is not visualized. No evidence of pulmonic stenosis. Aorta: The aortic root and ascending aorta are structurally normal, with no evidence of dilitation. Venous: The inferior vena cava is normal in size with greater than 50% respiratory variability, suggesting right atrial pressure of 3 mmHg. IAS/Shunts: No atrial level shunt detected by color flow Doppler. EKG: Rhythm strip during this exam demostrated normal sinus rhythm and premature ventricular contractions.  LEFT VENTRICLE PLAX 2D LVIDd:         3.90 cm LVIDs:         2.90 cm LV PW:         0.80 cm LV IVS:        0.80 cm LVOT diam:     2.10 cm LV SV:         51.26 ml LV SV Index:   19.22 LVOT Area:     3.46 cm  RIGHT VENTRICLE RV S prime:     4.57 cm/s TAPSE (M-mode): 1.3 cm LEFT ATRIUM             Index       RIGHT ATRIUM           Index LA diam:        2.20 cm 1.23 cm/m  RA Area:     10.50 cm LA  Vol (A2C):   35.7 ml 20.00 ml/m RA Volume:   21.70 ml  12.16 ml/m LA Vol (A4C):   27.4 ml 15.35 ml/m LA Biplane Vol: 32.3 ml 18.09 ml/m  AORTIC VALVE LVOT Vmax:   77.47 cm/s LVOT Vmean:  54.033 cm/s LVOT VTI:    0.148 m  AORTA Ao Root diam: 2.80 cm Ao Asc diam:  3.20 cm TRICUSPID VALVE TR Peak grad:   30.5 mmHg TR Vmax:        276.00 cm/s  SHUNTS Systemic VTI:  0.15 m Systemic Diam: 2.10 cm Eleonore Chiquito MD Electronically signed by Eleonore Chiquito MD Signature Date/Time: 12/03/2019/11:16:38 AM    Final    VAS [place] LOWER EXTREMITY VENOUS (DVT)  Result Date: 12/03/2019  Lower Venous DVTStudy Indications: Pulmonary embolism.  Risk Factors: None identified. Comparison Study: No prior studies. Performing Technologist: Oliver Hum RVT  Examination Guidelines: A complete evaluation includes B-mode imaging, spectral Doppler, color Doppler, and power Doppler as needed of all accessible portions of each vessel. Bilateral testing is considered an integral part of a complete examination. Limited examinations for reoccurring indications may be performed as noted. The reflux portion of the exam is performed with the patient in reverse Trendelenburg.  +---------+---------------+---------+-----------+----------+--------------+ RIGHT    CompressibilityPhasicitySpontaneityPropertiesThrombus Aging +---------+---------------+---------+-----------+----------+--------------+ CFV      Full           Yes      Yes                                 +---------+---------------+---------+-----------+----------+--------------+ SFJ      Full                                                        +---------+---------------+---------+-----------+----------+--------------+ FV Prox  Full                                                        +---------+---------------+---------+-----------+----------+--------------+  FV Mid   Full                                                         +---------+---------------+---------+-----------+----------+--------------+ FV DistalFull                                                        +---------+---------------+---------+-----------+----------+--------------+ PFV      Full                                                        +---------+---------------+---------+-----------+----------+--------------+ POP      Full           Yes      Yes                                 +---------+---------------+---------+-----------+----------+--------------+ PTV      Full                                                        +---------+---------------+---------+-----------+----------+--------------+ PERO     Full                                                        +---------+---------------+---------+-----------+----------+--------------+   +---------+---------------+---------+-----------+----------+--------------+ LEFT     CompressibilityPhasicitySpontaneityPropertiesThrombus Aging +---------+---------------+---------+-----------+----------+--------------+ CFV      Full           Yes      Yes                                 +---------+---------------+---------+-----------+----------+--------------+ SFJ      Full                                                        +---------+---------------+---------+-----------+----------+--------------+ FV Prox  Partial        No       No                   Acute          +---------+---------------+---------+-----------+----------+--------------+ FV Mid   None           No       No                   Acute          +---------+---------------+---------+-----------+----------+--------------+ FV DistalNone  No       No                   Acute          +---------+---------------+---------+-----------+----------+--------------+ PFV      Full                                                         +---------+---------------+---------+-----------+----------+--------------+ POP      None           No       No                   Acute          +---------+---------------+---------+-----------+----------+--------------+ PTV      Partial                                      Acute          +---------+---------------+---------+-----------+----------+--------------+ PERO     Partial                                      Acute          +---------+---------------+---------+-----------+----------+--------------+     Summary: RIGHT: - There is no evidence of deep vein thrombosis in the lower extremity.  - No cystic structure found in the popliteal fossa.  LEFT: - Findings consistent with acute deep vein thrombosis involving the left femoral vein, left popliteal vein, left posterior tibial veins, and left peroneal veins. - No cystic structure found in the popliteal fossa.  *See table(s) above for measurements and observations.    Preliminary     Scheduled Meds: . clonazePAM  0.5 mg Oral TID  . docusate sodium  100 mg Oral Daily  . folic acid  2 mg Oral Daily  . HYDROcodone-acetaminophen  1 tablet Oral TID  . ipratropium-albuterol  3 mL Nebulization Q6H  . mometasone-formoterol  2 puff Inhalation BID  . potassium chloride SA  20 mEq Oral Daily  . predniSONE  2.5 mg Oral Daily  . umeclidinium bromide  1 puff Inhalation Daily  . valACYclovir  500 mg Oral BID  . venlafaxine XR  37.5 mg Oral QHS  . cyanocobalamin  1,000 mcg Oral Daily   Continuous Infusions: . heparin 800 Units/hr (12/03/19 1352)     LOS: 1 day   Marylu Lund, MD [facility] Pager On Amion  If 7PM-7AM, please contact night-coverage 12/03/2019, 3:50 PM

## 2019-12-04 LAB — CBC
HCT: 41.1 % (ref 39.0–52.0)
Hemoglobin: 13.6 g/dL (ref 13.0–17.0)
MCH: 39.3 pg — ABNORMAL HIGH (ref 26.0–34.0)
MCHC: 33.1 g/dL (ref 30.0–36.0)
MCV: 118.8 fL — ABNORMAL HIGH (ref 80.0–100.0)
Platelets: 120 10*3/uL — ABNORMAL LOW (ref 150–400)
RBC: 3.46 MIL/uL — ABNORMAL LOW (ref 4.22–5.81)
RDW: 14.6 % (ref 11.5–15.5)
WBC: 8.8 10*3/uL (ref 4.0–10.5)
nRBC: 0 % (ref 0.0–0.2)

## 2019-12-04 LAB — HEPARIN LEVEL (UNFRACTIONATED): Heparin Unfractionated: 0.48 IU/mL (ref 0.30–0.70)

## 2019-12-04 NOTE — Progress Notes (Signed)
ANTICOAGULATION CONSULT NOTE - Follow Up Consult  Pharmacy Consult for Heparin Indication: pulmonary embolus  Allergies  Allergen Reactions  . Other Other (See Comments)    "CILLIN" Family Has patient had a PCN reaction causing immediate rash, facial/tongue/throat swelling, SOB or lightheadedness with hypotension: no Has patient had a PCN reaction causing severe rash involving mucus membranes or skin necrosis: yes Has patient had a PCN reaction that required hospitalization: no Has patient had a PCN reaction occurring within the last 10 years: no If all of the above answers are "NO", then may proceed with Cephalosporin use.   . Aspirin Other (See Comments)    Gi upset  . Naproxen Sodium Other (See Comments)    Whelps in large doses **ALEVE**    Patient Measurements: Height: 5\' 10"  (177.8 cm) Weight: 138 lb 7.2 oz (62.8 kg) IBW/kg (Calculated) : 73 Heparin Dosing Weight:   Vital Signs: Temp: 98 F (36.7 C) (02/16 0558) Temp Source: Oral (02/16 0558) BP: 141/96 (02/16 0558) Pulse Rate: 72 (02/16 0558)  Labs: Recent Labs    12/02/19 1345 12/02/19 1345 12/02/19 1630 12/02/19 2130 12/03/19 0003 12/03/19 0210 12/03/19 0210 12/03/19 1223 12/03/19 2135 12/04/19 0745  HGB 15.6   < >  --   --   --  14.5  --   --   --  13.6  HCT 47.8  --   --   --   --  44.0  --   --   --  41.1  PLT 150  --   --   --   --  129*  --   --   --  120*  APTT  --   --  32  --   --   --   --   --   --   --   LABPROT  --   --  12.4  --   --   --   --   --   --   --   INR  --   --  0.9  --   --   --   --   --   --   --   HEPARINUNFRC  --   --   --   --   --  1.16*   < > 0.85* 0.45 0.48  CREATININE 0.97  --   --   --   --  0.95  --   --   --   --   TROPONINIHS 266*  --   --  690* 516*  --   --   --   --   --    < > = values in this interval not displayed.    Estimated Creatinine Clearance: 62.4 mL/min (by C-G formula based on SCr of 0.95 mg/dL).   Medications:  Infusions:  . heparin 800  Units/hr (12/03/19 1352)    Assessment: 73 y/oM who presented to [facility] ED on 12/02/2019 with shortness of breath. D-dimer > 20. CTa chest + for bilateral segmental PE with early right heart strain. Pharmacy consulted for IV heparin dosing. 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 H/H, Pltc WNL.   Today, 12/04/19  HL is 0.48, therapeutic  Hgb 13.6, plt 120  SCr 0.95 mg/dl ( 2/15)    No bleeding or infusion issues per RN.   Goal of Therapy:  Heparin level 0.3-0.7 units/ml Monitor platelets by anticoagulation protocol: Yes  Plan:   Continue heparin drip at 800 units/hr  Daily HL  Daily CBC while on heparin drip  Monitor for signs and symptoms of bleeding    Royetta Asal, PharmD, BCPS 12/04/2019 9:30 AM

## 2019-12-04 NOTE — Progress Notes (Signed)
ANTICOAGULATION CONSULT NOTE - Follow Up Consult  Pharmacy Consult for Heparin Indication: pulmonary embolus  Allergies  Allergen Reactions  . Other Other (See Comments)    "CILLIN" Family Has patient had a PCN reaction causing immediate rash, facial/tongue/throat swelling, SOB or lightheadedness with hypotension: no Has patient had a PCN reaction causing severe rash involving mucus membranes or skin necrosis: yes Has patient had a PCN reaction that required hospitalization: no Has patient had a PCN reaction occurring within the last 10 years: no If all of the above answers are "NO", then may proceed with Cephalosporin use.   . Aspirin Other (See Comments)    Gi upset  . Naproxen Sodium Other (See Comments)    Whelps in large doses **ALEVE**    Patient Measurements: Height: 5\' 10"  (177.8 cm) Weight: 138 lb 7.2 oz (62.8 kg) IBW/kg (Calculated) : 73 Heparin Dosing Weight:   Vital Signs: Temp: 97.8 F (36.6 C) (02/15 2040) Temp Source: Oral (02/15 2040) BP: 113/78 (02/15 2040) Pulse Rate: 88 (02/15 2040)  Labs: Recent Labs    12/02/19 1345 12/02/19 1630 12/02/19 2130 12/03/19 0003 12/03/19 0210 12/03/19 1223 12/03/19 2135  HGB 15.6  --   --   --  14.5  --   --   HCT 47.8  --   --   --  44.0  --   --   PLT 150  --   --   --  129*  --   --   APTT  --  32  --   --   --   --   --   LABPROT  --  12.4  --   --   --   --   --   INR  --  0.9  --   --   --   --   --   HEPARINUNFRC  --   --   --   --  1.16* 0.85* 0.45  CREATININE 0.97  --   --   --  0.95  --   --   TROPONINIHS 266*  --  690* 516*  --   --   --     Estimated Creatinine Clearance: 62.4 mL/min (by C-G formula based on SCr of 0.95 mg/dL).   Medications:  Infusions:  . heparin 800 Units/hr (12/03/19 1352)    Assessment: 73 y/oM who presented to [facility] ED on 12/02/2019 with shortness of breath. D-dimer > 20. CTa chest + for bilateral segmental PE with early right heart strain. Pharmacy consulted for IV  heparin dosing. 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 H/H, Pltc WNL.    2/15  HL is 0.85  Hgb 14.5, plt 129  SCr 0.95 mg/dl   2135 HL = 0.45 at goal, no bleeding or infusion issues per RN.   Goal of Therapy:  Heparin level 0.3-0.7 units/ml Monitor platelets by anticoagulation protocol: Yes   Plan:   Continue heparin drip at 800 units/hr  Daily HL  Daily CBC while on heparin drip  Monitor for signs and symptoms of bleeding    Dorrene German 12/04/2019 12:06 AM

## 2019-12-04 NOTE — Progress Notes (Signed)
PROGRESS NOTE    Kenneth [name]  IWL:[id number] DOB: 03-Sep-1946 DOA: 12/02/2019 PCP: [name], MD    Brief Narrative:  74 y.o. male with medical history significant for RA previously on methotrexate, severe emphysema, non-small cell lung cancer s/p radiation in 2018, history of left lower extremity DVT who presented to the ED for increasing shortness of breath. History mostly obtained from son and ER physician given patient was not a great historian.  Son was helping to turn him to lay on his right side today to evaluate some pressure ulcers when patient developed sudden acute onset of shortness of breath and tachypnea.  Patient has been bedbound since October 2020 since he became progressively weak and had recurrent falls.  He is currently followed by palliative care outpatient.   In the ER, he was afebrile, tachycardic and tachypnea initially requiring 15L NRB but was able to wean down to 4L via [place].   D-dimer greater than 20.  CTA chest revealing for bilateral segmental pulmonary emboli with signs of right heart strain.  Troponin elevated to 266. Negative COVID and flu PCR test  He was evaluated by critical care in the ED and no indication for TPA or catheter directed anticoagulation was recommended.  Patient was started on heparin drip and asked to be admitted by hospitalist for further work-up.  Assessment & Plan:   Principal Problem:   Acute pulmonary embolism (HCC) Active Problems:   COPD with chronic bronchitis (HCC)   Protein-calorie malnutrition, severe   Rheumatoid arthritis ([place])   Respiratory failure with hypoxia ([place])   DNR (do not resuscitate)   Acute submassive bilateral pulmonary emboli with BLE DVT -Patient evaluated by critical care in the ED and no indication for TPA or catheter directed anticoagulation.  -CT personally reviewed. Findings of PE with evidence of R heart strain -Patient is continued on heparin gtt. Discussed case with Hematology who  recommends transition to [place] when transitioning off heparin.  -2d echo performed, finding of moderately reduced RV function -BLE dopplers reviewed, findings of acute DVT involving L femoral vein, L popliteal vein, L post tibial veins, L peroneal veins -Given clot burden and R heart strain, continue heparin x total 48hrs and plan to transition to xarelto 2/27  Acute hypoxemic respiratory failure secondary to pulmonary embolism in the setting of severe emphysema/fibrosis  -Critical care added Incruse daily and Dulera BID for COPD   -Resume Trelegy at discharge -Continue with PRN duonebs q6hrs  -Stable this AM  Rheumatoid arthritis  -Continue chronic 2.5 mg prednisone as tolerated -Continue 3 times daily home Norco for pain as tolerated  Hypertension -Patient was hypotensive on arrival to the ED.   -Home norvasc presently on hold  Depression -Continue Effexor, Klonopin as pt tolerates -Seems stable at this time  Pressure ulcers -Son endorsed seeing erythema on his buttocks and around his rectal region.  -recommend frequent turning  Severe protein calorie malnutrition -Consulted dietician   DVT prophylaxis: On therapeutic heparin gtt Code Status: DNR Family Communication: Pt in room, family not at bedside Disposition Plan: From home, anticipate d/c home 2/17 when transitioned off heparin gtt and transition to [place]  Consultants:   PCCM  Discussed case with Hematology  Procedures:     Antimicrobials: Anti-infectives (From admission, onward)   Start     Dose/Rate Route Frequency Ordered Stop   12/02/19 2215  valACYclovir (VALTREX) tablet 500 mg     500 mg Oral 2 times daily 12/02/19 2204  Subjective: Eager to go home soon  Objective: Vitals:   12/04/19 0558 12/04/19 0802 12/04/19 0806 12/04/19 1215  BP: (!) 141/96   114/80  Pulse: 72   88  Resp: 18     Temp: 98 F (36.7 C)   97.6 F (36.4 C)  TempSrc: Oral   Oral  SpO2: 98% 95% 95% 96%    Weight:      Height:        Intake/Output Summary (Last 24 hours) at 12/04/2019 1600 Last data filed at 12/04/2019 2025 Gross per 24 hour  Intake 365.07 ml  Output 550 ml  Net -184.93 ml   Filed Weights   12/02/19 1343 12/02/19 2233  Weight: 68 kg 62.8 kg    Examination: General exam: Awake, laying in bed, in nad Respiratory system: Normal respiratory effort, no wheezing Cardiovascular system: regular rate, s1, s2 Gastrointestinal system: Soft, nondistended, positive BS Central nervous system: CN2-12 grossly intact, strength intact Extremities: Perfused, no clubbing Skin: Normal skin turgor, no notable skin lesions seen Psychiatry: Mood normal // no visual hallucinations   Data Reviewed: I have personally reviewed following labs and imaging studies  CBC: Recent Labs  Lab 12/02/19 1345 12/03/19 0210 12/04/19 0745  WBC 12.1* 10.4 8.8  HGB 15.6 14.5 13.6  HCT 47.8 44.0 41.1  MCV 118.6* 117.6* 118.8*  PLT 150 129* 427*   Basic Metabolic Panel: Recent Labs  Lab 12/02/19 1345 12/03/19 0210  NA 141 139  K 3.8 4.9  CL 104 105  CO2 26 25  GLUCOSE 145* 120*  BUN 16 14  CREATININE 0.97 0.95  CALCIUM 9.4 9.0   GFR: Estimated Creatinine Clearance: 62.4 mL/min (by C-G formula based on SCr of 0.95 mg/dL). Liver Function Tests: No results for input(s): AST, ALT, ALKPHOS, BILITOT, PROT, ALBUMIN in the last 168 hours. No results for input(s): LIPASE, AMYLASE in the last 168 hours. No results for input(s): AMMONIA in the last 168 hours. Coagulation Profile: Recent Labs  Lab 12/02/19 1630  INR 0.9   Cardiac Enzymes: No results for input(s): CKTOTAL, CKMB, CKMBINDEX, TROPONINI in the last 168 hours. BNP (last 3 results) No results for input(s): PROBNP in the last 8760 hours. HbA1C: No results for input(s): HGBA1C in the last 72 hours. CBG: No results for input(s): GLUCAP in the last 168 hours. Lipid Profile: No results for input(s): CHOL, HDL, LDLCALC, TRIG,  CHOLHDL, LDLDIRECT in the last 72 hours. Thyroid Function Tests: No results for input(s): TSH, T4TOTAL, FREET4, T3FREE, THYROIDAB in the last 72 hours. Anemia Panel: No results for input(s): VITAMINB12, FOLATE, FERRITIN, TIBC, IRON, RETICCTPCT in the last 72 hours. Sepsis Labs: No results for input(s): PROCALCITON, LATICACIDVEN in the last 168 hours.  Recent Results (from the past 240 hour(s))  Respiratory Panel by RT PCR (Flu A&B, Covid) - Nasopharyngeal Swab     Status: None   Collection Time: 12/02/19  3:52 PM   Specimen: Nasopharyngeal Swab  Result Value Ref Range Status   SARS Coronavirus 2 by RT PCR NEGATIVE NEGATIVE Final    Comment: (NOTE) SARS-CoV-2 target nucleic acids are NOT DETECTED. The SARS-CoV-2 RNA is generally detectable in upper respiratoy specimens during the acute phase of infection. The lowest concentration of SARS-CoV-2 viral copies this assay can detect is 131 copies/mL. A negative result does not preclude SARS-Cov-2 infection and should not be used as the sole basis for treatment or other patient management decisions. A negative result may occur with  improper specimen collection/handling, submission of specimen other  than nasopharyngeal swab, presence of viral mutation(s) within the areas targeted by this assay, and inadequate number of viral copies (<131 copies/mL). A negative result must be combined with clinical observations, patient history, and epidemiological information. The expected result is Negative. Fact Sheet for Patients:  PinkCheek.be Fact Sheet for Healthcare Providers:  GravelBags.it This test is not yet ap proved or cleared by the [place] FDA and  has been authorized for detection and/or diagnosis of SARS-CoV-2 by FDA under an Emergency Use Authorization (EUA). This EUA will remain  in effect (meaning this test can be used) for the duration of the COVID-19 declaration under  Section 564(b)(1) of the Act, 21 U.S.C. section 360bbb-3(b)(1), unless the authorization is terminated or revoked sooner.    Influenza A by PCR NEGATIVE NEGATIVE Final   Influenza B by PCR NEGATIVE NEGATIVE Final    Comment: (NOTE) The Xpert Xpress SARS-CoV-2/FLU/RSV assay is intended as an aid in  the diagnosis of influenza from Nasopharyngeal swab specimens and  should not be used as a sole basis for treatment. Nasal washings and  aspirates are unacceptable for Xpert Xpress SARS-CoV-2/FLU/RSV  testing. Fact Sheet for Patients: PinkCheek.be Fact Sheet for Healthcare Providers: GravelBags.it This test is not yet approved or cleared by the [place] FDA and  has been authorized for detection and/or diagnosis of SARS-CoV-2 by  FDA under an Emergency Use Authorization (EUA). This EUA will remain  in effect (meaning this test can be used) for the duration of the  Covid-19 declaration under Section 564(b)(1) of the Act, 21  U.S.C. section 360bbb-3(b)(1), unless the authorization is  terminated or revoked. Performed at [facility], [place] [address] [place], [place] [address]      Radiology Studies: CT Angio Chest PE W and/or Wo Contrast  Result Date: 12/02/2019 CLINICAL DATA:  Shortness of breath, elevated D-dimer, history of COPD EXAM: CT ANGIOGRAPHY CHEST WITH CONTRAST TECHNIQUE: Multidetector CT imaging of the chest was performed using the standard protocol during bolus administration of intravenous contrast. Multiplanar CT image reconstructions and MIPs were obtained to evaluate the vascular anatomy. CONTRAST:  33mL OMNIPAQUE IOHEXOL 350 MG/ML SOLN COMPARISON:  12/02/2019, 09/07/2019 FINDINGS: Cardiovascular: This is a technically adequate evaluation of the pulmonary vasculature. There are bilateral segmental pulmonary emboli greatest in the right upper and left lower lobe distributions. Moderate clot burden.  There is straightening of the interventricular septum with no frank right ventricular dilatation at this time. Thoracic aorta is normal in caliber. Mild atherosclerosis of the aorta and coronary vessels. No pericardial effusion. Mediastinum/Nodes: No pathologic mediastinal or hilar adenopathy. Lungs/Pleura: There is severe emphysema, with chronic bibasilar scarring and fibrosis. Increased consolidation within the right lower lobe is felt to be hypoventilatory rather than airspace disease or infarct. No effusion or pneumothorax. Central airways are patent. There is mucoid material within the bronchus intermedius. Upper Abdomen: Calcified gallstones are identified without cholecystitis. The remainder of the upper abdomen is unremarkable. Musculoskeletal: There are no acute displaced fractures. The reconstructed images demonstrate no additional findings. Review of the MIP images confirms the above findings. IMPRESSION: 1. Bilateral segmental pulmonary emboli. There is straightening of the interventricular septum consistent with early right heart strain. 2. Severe background emphysema, with bibasilar scarring and atelectasis. 3. Cholelithiasis without cholecystitis. These results were called by telephone at the time of interpretation on 12/02/2019 at 5:06 pm to provider [facility] , who verbally acknowledged these results. Electronically Signed   By: Randa Ngo M.D.   On: 12/02/2019 17:06  ECHOCARDIOGRAM COMPLETE  Result Date: 12/03/2019    ECHOCARDIOGRAM REPORT   Patient Name:   EUGUNE SINE Date of Exam: 12/03/2019 Medical Rec #:  [id number]     Height:       70.0 in Accession #:    [id number]    Weight:       138.4 lb Date of Birth:  1946/09/28      BSA:          1.79 m Patient Age:    34 years      BP:           129/80 mmHg Patient Gender: M             HR:           93 bpm. Exam Location:  Inpatient Procedure: 2D Echo, Color Doppler and Cardiac Doppler Indications:    I26.02 Pulmonary embolus  History:         Patient has no prior history of Echocardiogram examinations.                 COPD; Risk Factors:Dyslipidemia.  Sonographer:    Raquel Sarna Senior RDCS Referring Phys: [id number] [place]  1. Left ventricular ejection fraction, by estimation, is 60 to 65%. The left ventricle has normal function. The left ventricle has no regional wall motion abnormalities. Left ventricular diastolic parameters are consistent with Grade I diastolic dysfunction (impaired relaxation).  2. Right ventricular systolic function is moderately reduced. The right ventricular size is moderately enlarged. There is normal pulmonary artery systolic pressure. The estimated right ventricular systolic pressure is 61.6 mmHg.  3. The mitral valve is grossly normal. No evidence of mitral valve regurgitation. No evidence of mitral stenosis.  4. The aortic valve is tricuspid. Aortic valve regurgitation is not visualized. No aortic stenosis is present.  5. The inferior vena cava is normal in size with greater than 50% respiratory variability, suggesting right atrial pressure of 3 mmHg. Comparison(s): No prior Echocardiogram. FINDINGS  Left Ventricle: Left ventricular ejection fraction, by estimation, is 60 to 65%. The left ventricle has normal function. The left ventricle has no regional wall motion abnormalities. The left ventricular internal cavity size was normal in size. There is  no left ventricular hypertrophy. Left ventricular diastolic parameters are consistent with Grade I diastolic dysfunction (impaired relaxation). Right Ventricle: The right ventricular size is moderately enlarged. No increase in right ventricular wall thickness. Right ventricular systolic function is moderately reduced. There is normal pulmonary artery systolic pressure. The tricuspid regurgitant velocity is 2.76 m/s, and with an assumed right atrial pressure of 3 mmHg, the estimated right ventricular systolic pressure is 07.3 mmHg. Left Atrium: Left atrial size was normal  in size. Right Atrium: Right atrial size was normal in size. Pericardium: There is no evidence of pericardial effusion. Mitral Valve: The mitral valve is grossly normal. No evidence of mitral valve regurgitation. No evidence of mitral valve stenosis. Tricuspid Valve: The tricuspid valve is grossly normal. Tricuspid valve regurgitation is not demonstrated. No evidence of tricuspid stenosis. Aortic Valve: The aortic valve is tricuspid. Aortic valve regurgitation is not visualized. No aortic stenosis is present. Pulmonic Valve: The pulmonic valve was grossly normal. Pulmonic valve regurgitation is not visualized. No evidence of pulmonic stenosis. Aorta: The aortic root and ascending aorta are structurally normal, with no evidence of dilitation. Venous: The inferior vena cava is normal in size with greater than 50% respiratory variability, suggesting right atrial pressure of 3 mmHg. IAS/Shunts: No atrial  level shunt detected by color flow Doppler. EKG: Rhythm strip during this exam demostrated normal sinus rhythm and premature ventricular contractions.  LEFT VENTRICLE PLAX 2D LVIDd:         3.90 cm LVIDs:         2.90 cm LV PW:         0.80 cm LV IVS:        0.80 cm LVOT diam:     2.10 cm LV SV:         51.26 ml LV SV Index:   19.22 LVOT Area:     3.46 cm  RIGHT VENTRICLE RV S prime:     4.57 cm/s TAPSE (M-mode): 1.3 cm LEFT ATRIUM             Index       RIGHT ATRIUM           Index LA diam:        2.20 cm 1.23 cm/m  RA Area:     10.50 cm LA Vol (A2C):   35.7 ml 20.00 ml/m RA Volume:   21.70 ml  12.16 ml/m LA Vol (A4C):   27.4 ml 15.35 ml/m LA Biplane Vol: 32.3 ml 18.09 ml/m  AORTIC VALVE LVOT Vmax:   77.47 cm/s LVOT Vmean:  54.033 cm/s LVOT VTI:    0.148 m  AORTA Ao Root diam: 2.80 cm Ao Asc diam:  3.20 cm TRICUSPID VALVE TR Peak grad:   30.5 mmHg TR Vmax:        276.00 cm/s  SHUNTS Systemic VTI:  0.15 m Systemic Diam: 2.10 cm Eleonore Chiquito MD Electronically signed by Eleonore Chiquito MD Signature Date/Time:  12/03/2019/11:16:38 AM    Final    VAS [place] LOWER EXTREMITY VENOUS (DVT)  Result Date: 12/03/2019  Lower Venous DVTStudy Indications: Pulmonary embolism.  Risk Factors: None identified. Comparison Study: No prior studies. Performing Technologist: Oliver Hum RVT  Examination Guidelines: A complete evaluation includes B-mode imaging, spectral Doppler, color Doppler, and power Doppler as needed of all accessible portions of each vessel. Bilateral testing is considered an integral part of a complete examination. Limited examinations for reoccurring indications may be performed as noted. The reflux portion of the exam is performed with the patient in reverse Trendelenburg.  +---------+---------------+---------+-----------+----------+--------------+ RIGHT    CompressibilityPhasicitySpontaneityPropertiesThrombus Aging +---------+---------------+---------+-----------+----------+--------------+ CFV      Full           Yes      Yes                                 +---------+---------------+---------+-----------+----------+--------------+ SFJ      Full                                                        +---------+---------------+---------+-----------+----------+--------------+ FV Prox  Full                                                        +---------+---------------+---------+-----------+----------+--------------+ FV Mid   Full                                                        +---------+---------------+---------+-----------+----------+--------------+  FV DistalFull                                                        +---------+---------------+---------+-----------+----------+--------------+ PFV      Full                                                        +---------+---------------+---------+-----------+----------+--------------+ POP      Full           Yes      Yes                                  +---------+---------------+---------+-----------+----------+--------------+ PTV      Full                                                        +---------+---------------+---------+-----------+----------+--------------+ PERO     Full                                                        +---------+---------------+---------+-----------+----------+--------------+   +---------+---------------+---------+-----------+----------+--------------+ LEFT     CompressibilityPhasicitySpontaneityPropertiesThrombus Aging +---------+---------------+---------+-----------+----------+--------------+ CFV      Full           Yes      Yes                                 +---------+---------------+---------+-----------+----------+--------------+ SFJ      Full                                                        +---------+---------------+---------+-----------+----------+--------------+ FV Prox  Partial        No       No                   Acute          +---------+---------------+---------+-----------+----------+--------------+ FV Mid   None           No       No                   Acute          +---------+---------------+---------+-----------+----------+--------------+ FV DistalNone           No       No                   Acute          +---------+---------------+---------+-----------+----------+--------------+ PFV      Full                                                        +---------+---------------+---------+-----------+----------+--------------+  POP      None           No       No                   Acute          +---------+---------------+---------+-----------+----------+--------------+ PTV      Partial                                      Acute          +---------+---------------+---------+-----------+----------+--------------+ PERO     Partial                                      Acute           +---------+---------------+---------+-----------+----------+--------------+     Summary: RIGHT: - There is no evidence of deep vein thrombosis in the lower extremity.  - No cystic structure found in the popliteal fossa.  LEFT: - Findings consistent with acute deep vein thrombosis involving the left femoral vein, left popliteal vein, left posterior tibial veins, and left peroneal veins. - No cystic structure found in the popliteal fossa.  *See table(s) above for measurements and observations. Electronically signed by Ruta Hinds MD on 12/03/2019 at 5:22:45 PM.    Final     Scheduled Meds: . albuterol  2.5 mg Nebulization TID  . clonazePAM  0.5 mg Oral TID  . docusate sodium  100 mg Oral Daily  . folic acid  2 mg Oral Daily  . HYDROcodone-acetaminophen  1 tablet Oral TID  . mometasone-formoterol  2 puff Inhalation BID  . potassium chloride SA  20 mEq Oral Daily  . predniSONE  2.5 mg Oral Daily  . umeclidinium bromide  1 puff Inhalation Daily  . valACYclovir  500 mg Oral BID  . venlafaxine XR  37.5 mg Oral QHS  . cyanocobalamin  1,000 mcg Oral Daily   Continuous Infusions: . heparin 800 Units/hr (12/03/19 1352)     LOS: 2 days   Marylu Lund, MD [facility] Pager On Amion  If 7PM-7AM, please contact night-coverage 12/04/2019, 4:00 PM

## 2019-12-05 LAB — CBC
HCT: 40.8 % (ref 39.0–52.0)
Hemoglobin: 13.4 g/dL (ref 13.0–17.0)
MCH: 38.7 pg — ABNORMAL HIGH (ref 26.0–34.0)
MCHC: 32.8 g/dL (ref 30.0–36.0)
MCV: 117.9 fL — ABNORMAL HIGH (ref 80.0–100.0)
Platelets: 124 10*3/uL — ABNORMAL LOW (ref 150–400)
RBC: 3.46 MIL/uL — ABNORMAL LOW (ref 4.22–5.81)
RDW: 14.5 % (ref 11.5–15.5)
WBC: 6.5 10*3/uL (ref 4.0–10.5)
nRBC: 0 % (ref 0.0–0.2)

## 2019-12-05 LAB — COMPREHENSIVE METABOLIC PANEL
ALT: 33 U/L (ref 0–44)
AST: 24 U/L (ref 15–41)
Albumin: 2.8 g/dL — ABNORMAL LOW (ref 3.5–5.0)
Alkaline Phosphatase: 77 U/L (ref 38–126)
Anion gap: 10 (ref 5–15)
BUN: 20 mg/dL (ref 8–23)
CO2: 24 mmol/L (ref 22–32)
Calcium: 8.7 mg/dL — ABNORMAL LOW (ref 8.9–10.3)
Chloride: 102 mmol/L (ref 98–111)
Creatinine, Ser: 0.86 mg/dL (ref 0.61–1.24)
GFR calc Af Amer: >60 mL/min (ref >60)
GFR calc non Af Amer: >60 mL/min (ref >60)
Glucose, Bld: 81 mg/dL (ref 70–99)
Potassium: 4.3 mmol/L (ref 3.5–5.1)
Sodium: 136 mmol/L (ref 135–145)
Total Bilirubin: 0.5 mg/dL (ref 0.3–1.2)
Total Protein: 6.1 g/dL — ABNORMAL LOW (ref 6.5–8.1)

## 2019-12-05 LAB — HEPARIN LEVEL (UNFRACTIONATED): Heparin Unfractionated: 0.28 IU/mL — ABNORMAL LOW (ref 0.30–0.70)

## 2019-12-05 MED ORDER — RIVAROXABAN 20 MG PO TABS: 20.0000 mg | ORAL_TABLET | Freq: Every day | ORAL | Status: DC

## 2019-12-05 MED ORDER — HEPARIN (PORCINE) 25000 UT/250ML-% IV SOLN: 900.0000 [IU]/h | INTRAVENOUS | Status: DC

## 2019-12-05 MED ORDER — RIVAROXABAN 15 MG PO TABS
15.0000 mg | ORAL_TABLET | Freq: Two times a day (BID) | ORAL | Status: DC
  Administered 2019-12-05: 15 mg via ORAL
  Filled 2019-12-05: qty 1

## 2019-12-05 MED ORDER — RIVAROXABAN (XARELTO) VTE STARTER PACK (15 & 20 MG): ORAL_TABLET | ORAL | 0 refills | Status: AC

## 2019-12-05 NOTE — TOC Progression Note (Signed)
Transition of Care [facility]) - Progression Note    Patient Details  Name: Kenneth [name] MRN: [id number] Date of Birth: 04/06/1946  [facility]) CM/SW Contact  Issac Moure, Juliann Pulse, RN Phone Number: 12/05/2019, 12:39 PM  Clinical Narrative: From home w/son David-has rw,3n1,w/c. May need home 02-will monitor. Active w/[facility]-RN/PT/aide.DNR. Son able to transport home.           Expected Discharge Plan and Services                                                 Social Determinants of Health (SDOH) Interventions    Readmission Risk Interventions No flowsheet data found.

## 2019-12-05 NOTE — TOC Transition Note (Signed)
Transition of Care [facility]) - CM/SW Discharge Note   Patient Details  Name: [name] MRN: [id number] Date of Birth: Nov 18, 1945  Transition of [facility]) CM/SW Contact:  Dessa Phi, RN Phone Number: 12/05/2019, 4:04 PM   Clinical Narrative:  [facility] to be called once Hulan Fess has delivered home 02 tank to the house.Nsg aware.       Barriers to Discharge: No Barriers Identified   Patient Goals and CMS Choice        Discharge Placement                       Discharge Plan and Services                DME Arranged: Oxygen DME Agency: [employer] Date DME Agency Contacted: 12/05/19 Time DME Agency Contacted: [phone] Representative spoke with at DME Agency: [place] ([place]) Interventions     Readmission Risk Interventions No flowsheet data found.

## 2019-12-05 NOTE — Progress Notes (Signed)
ANTICOAGULATION CONSULT NOTE - Follow Up Consult  Pharmacy Consult for Heparin Indication: pulmonary embolus  Allergies  Allergen Reactions  . Other Other (See Comments)    "CILLIN" Family Has patient had a PCN reaction causing immediate rash, facial/tongue/throat swelling, SOB or lightheadedness with hypotension: no Has patient had a PCN reaction causing severe rash involving mucus membranes or skin necrosis: yes Has patient had a PCN reaction that required hospitalization: no Has patient had a PCN reaction occurring within the last 10 years: no If all of the above answers are "NO", then may proceed with Cephalosporin use.   . Aspirin Other (See Comments)    Gi upset  . Naproxen Sodium Other (See Comments)    Whelps in large doses **ALEVE**    Patient Measurements: Height: 5\' 10"  (177.8 cm) Weight: 138 lb 7.2 oz (62.8 kg) IBW/kg (Calculated) : 73 Heparin Dosing Weight:   Vital Signs: Temp: 98.9 F (37.2 C) (02/17 0436) Temp Source: Oral (02/17 0436) BP: 127/75 (02/17 0436) Pulse Rate: 83 (02/17 0436)  Labs: Recent Labs    12/02/19 1345 12/02/19 1345 12/02/19 1630 12/02/19 2130 12/03/19 0003 12/03/19 0210 12/03/19 0210 12/03/19 1223 12/03/19 2135 12/04/19 0745 12/05/19 0612  HGB 15.6   < >  --   --   --  14.5   < >  --   --  13.6 13.4  HCT 47.8   < >  --   --   --  44.0  --   --   --  41.1 40.8  PLT 150   < >  --   --   --  129*  --   --   --  120* 124*  APTT  --   --  32  --   --   --   --   --   --   --   --   LABPROT  --   --  12.4  --   --   --   --   --   --   --   --   INR  --   --  0.9  --   --   --   --   --   --   --   --   HEPARINUNFRC  --   --   --   --   --  1.16*  --    < > 0.45 0.48 0.28*  CREATININE 0.97  --   --   --   --  0.95  --   --   --   --  0.86  TROPONINIHS 266*  --   --  690* 516*  --   --   --   --   --   --    < > = values in this interval not displayed.    Estimated Creatinine Clearance: 69 mL/min (by C-G formula based on SCr of  0.86 mg/dL).   Medications:  Infusions:  . heparin 800 Units/hr (12/04/19 2049)    Assessment: 73 y/oM who [facility] ED on 12/02/2019 with shortness of breath. D-dimer > 20. CTa chest + for bilateral segmental PE with early right heart strain. Pharmacy consulted for IV heparin dosing. 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 H/H, Pltc WNL.   Today, 12/05/19  HL is 0.28, slightly subtherapeutic  Hgb 13.4, plt 124  SCr 0.86 mg/dl ( 2/15)  No bleeding or infusion issues per RN.   Goal of Therapy:  Heparin level 0.3-0.7 units/ml Monitor platelets by anticoagulation protocol: Yes   Plan:   Increase heparin drip to 900 units/hr  Obtain HL 8 hours after rate change   Daily HL  Daily CBC while on heparin drip  Monitor for signs and symptoms of bleeding    Royetta Asal, PharmD, BCPS 12/05/2019 8:14 AM

## 2019-12-05 NOTE — Progress Notes (Signed)
ANTICOAGULATION CONSULT NOTE - Follow Up Consult  Pharmacy Consult for rivaroxaban Indication: pulmonary embolus  Allergies  Allergen Reactions  . Other Other (See Comments)    "CILLIN" Family Has patient had a PCN reaction causing immediate rash, facial/tongue/throat swelling, SOB or lightheadedness with hypotension: no Has patient had a PCN reaction causing severe rash involving mucus membranes or skin necrosis: yes Has patient had a PCN reaction that required hospitalization: no Has patient had a PCN reaction occurring within the last 10 years: no If all of the above answers are "NO", then may proceed with Cephalosporin use.   . Aspirin Other (See Comments)    Gi upset  . Naproxen Sodium Other (See Comments)    Whelps in large doses **ALEVE**    Patient Measurements: Height: 5\' 10"  (177.8 cm) Weight: 138 lb 7.2 oz (62.8 kg) IBW/kg (Calculated) : 73 Heparin Dosing Weight:   Vital Signs: Temp: 98.9 F (37.2 C) (02/17 0436) Temp Source: Oral (02/17 0436) BP: 127/75 (02/17 0436) Pulse Rate: 83 (02/17 0436)  Labs: Recent Labs    12/02/19 1345 12/02/19 1345 12/02/19 1630 12/02/19 2130 12/03/19 0003 12/03/19 0210 12/03/19 0210 12/03/19 1223 12/03/19 2135 12/04/19 0745 12/05/19 0612  HGB 15.6   < >  --   --   --  14.5   < >  --   --  13.6 13.4  HCT 47.8   < >  --   --   --  44.0  --   --   --  41.1 40.8  PLT 150   < >  --   --   --  129*  --   --   --  120* 124*  APTT  --   --  32  --   --   --   --   --   --   --   --   LABPROT  --   --  12.4  --   --   --   --   --   --   --   --   INR  --   --  0.9  --   --   --   --   --   --   --   --   HEPARINUNFRC  --   --   --   --   --  1.16*  --    < > 0.45 0.48 0.28*  CREATININE 0.97  --   --   --   --  0.95  --   --   --   --  0.86  TROPONINIHS 266*  --   --  690* 516*  --   --   --   --   --   --    < > = values in this interval not displayed.    Estimated Creatinine Clearance: 69 mL/min (by C-G formula based on  SCr of 0.86 mg/dL).   Medications:  Infusions:    Assessment: 52 y/oM who presented to [facility] ED on 12/02/2019 with shortness of breath. D-dimer > 20. CTa chest + for bilateral segmental PE with early right heart strain. Pharmacy consulted for IV heparin dosing. 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 H/H, Pltc WNL.   Today, 12/05/19  HL is 0.28, slightly subtherapeutic  Hgb 13.4, plt 124  SCr 0.86 mg/dl, CrCl 69 ml/min   No bleeding or infusion issues  per RN.   Pt is now being transitioned to rivaroxaban  Goal of Therapy:  Heparin level 0.3-0.7 units/ml Monitor platelets by anticoagulation protocol: Yes   Plan:   Start rivaroxaban 15 mg PO BID with meals x 21 days, then rivaroxaban 20 mg PO daily with a meal   Monitor, renal function, CBC   Monitor for signs and symptoms of bleeding    Royetta Asal, PharmD, BCPS 12/05/2019 1:08 PM

## 2019-12-05 NOTE — Progress Notes (Signed)
SATURATION QUALIFICATIONS: (This note is used to comply with regulatory documentation for home oxygen)  Patient Saturations on Room Air at Rest = 83%    Patient Saturations on 2 Liters of oxygen with resting 93%  Please briefly explain why patient needs home oxygen:Desaturation with rest on room air Pt bedbound

## 2019-12-05 NOTE — Progress Notes (Signed)
Transportation arranged via [place].Stacey Drain

## 2019-12-05 NOTE — Progress Notes (Signed)
Pt to be discharged to home this afternoon via ambulance. Pt's Son Montarius Kitagawa has come to pick up oxygen that has been delivered to the room. Discharge instructions including Medications and schedules for these Medications reviewed with Pt and Pt's Son. Understanding verbalized. Discharge packet with prescription with Pt's Son

## 2019-12-05 NOTE — Care Management Important Message (Signed)
Important Message  Patient Details IM Letter given to Dessa Phi RN Case Manager to present to the Patient Name: Kenneth [name] MRN: [id number] Date of Birth: 1945/12/04   Medicare Important Message Given:  Yes     Kerin Salen 12/05/2019, 10:21 AM

## 2019-12-05 NOTE — Discharge Summary (Signed)
Physician Discharge Summary  Kenneth [name]:[id number] DOB: 1946-08-14 DOA: 12/02/2019  PCP: [name], MD  Admit date: 12/02/2019 Discharge date: 12/05/2019  Time spent: 45 minutes  Recommendations for Outpatient Follow-up:  -To be discharged home today. -He has been newly started on Xarelto for his acute pulmonary embolism. -Advise follow-up with PCP in 2 to 3 weeks.  Discharge Diagnoses:  Principal Problem:   Acute pulmonary embolism (HCC) Active Problems:   COPD with chronic bronchitis (HCC)   Protein-calorie malnutrition, severe   Rheumatoid arthritis ([place])   Respiratory failure with hypoxia ([place])   DNR (do not resuscitate)   Discharge Condition: Stable  Filed Weights   12/02/19 1343 12/02/19 2233  Weight: 68 kg 62.8 kg    History of present illness:  As per Dr. Flossie Buffy on 2/14:  Kenneth [name] is a 74 y.o. male with medical history significant for RA previously on methotrexate, severe emphysema, non-small cell lung cancer s/p radiation in 2018, history of left lower extremity DVT who presented to the ED for increasing shortness of breath. History mostly obtained from son and ER physician given patient was not a great historian.  Son was helping to turn him to lay on his right side today to evaluate some pressure ulcers when patient developed sudden acute onset of shortness of breath and tachypnea.  Patient has been bedbound since October 2020 since he became progressively weak and had recurrent falls.  He is currently followed by palliative care outpatient.   In the ER, he was afebrile, tachycardic and tachypnea initially requiring 15L NRB but was able to wean down to 4L via  [place].   D-dimer greater than 20.  CTA chest revealing for bilateral segmental pulmonary emboli with signs of right heart strain.  Hospital Course:   Acute submassive bilateral PE with DVT -He was assessed by critical care in the ED and it was determined that there was no indication for  TPA. -Because of evidence of right heart strain he was admitted to the hospital on a heparin drip for 48 hours. -Hematology has recommended transitioning to Xarelto which we will start today. -He had a 2D echo that showed moderately reduced RV function. -Lateral lower extremity Doppler showed findings of acute DVT involving the left femoral vein, left popliteal vein, left posterior tibial veins and left peroneal veins. -He has had a prior episode of DVTs I suspect this will now be lifelong anticoagulation.  Acute hypoxemic respiratory failure secondary to pulmonary embolism in the setting of severe emphysema/fibrosis -Resume Trelegy, he is stable. -No oxygen requirement on discharge.  Hypertension -Amlodipine is on hold due to mild hypotension on admission.  History of depression -Continue Effexor, Klonopin, appears stable.  Pressure ulcers -Present prior to admission these are sacral and stage II.  Severe protein caloric malnutrition -He will increase caloric intake.  Procedures:  2D echo on 2/15: Ejection fraction of 60 to 65%, no wall motion abnormalities, grade 1 diastolic dysfunction  Consultations:  Critical care  Hematology  Discharge Instructions  Discharge Instructions    Increase activity slowly   Complete by: As directed      Allergies as of 12/05/2019      Reactions   Other Other (See Comments)   "CILLIN" Family Has patient had a PCN reaction causing immediate rash, facial/tongue/throat swelling, SOB or lightheadedness with hypotension: no Has patient had a PCN reaction causing severe rash involving mucus membranes or skin necrosis: yes Has patient had a PCN reaction that required hospitalization:  no Has patient had a PCN reaction occurring within the last 10 years: no If all of the above answers are "NO", then may proceed with Cephalosporin use.   Aspirin Other (See Comments)   Gi upset   Naproxen Sodium Other (See Comments)   Whelps in large  doses **ALEVE**      Medication List    STOP taking these medications   amLODipine 5 MG tablet Commonly known as: NORVASC     TAKE these medications   albuterol 108 (90 Base) MCG/ACT inhaler Commonly known as: VENTOLIN HFA Inhale 1-2 puffs into the lungs every 6 (six) hours as needed for wheezing or shortness of breath.   cholecalciferol 1000 units tablet Commonly known as: VITAMIN D Take 1,000 Units by mouth daily.   clonazePAM 0.5 MG tablet Commonly known as: KLONOPIN Take 0.5 mg by mouth 3 (three) times daily.   cyanocobalamin 1000 MCG tablet Take 1 tablet (1,000 mcg total) by mouth daily.   docusate sodium 100 MG capsule Commonly known as: COLACE Take 100 mg by mouth daily.   folic acid 1 MG tablet Commonly known as: FOLVITE Take 5 tablets (5 mg total) by mouth daily. What changed: how much to take   HYDROcodone-acetaminophen 5-325 MG tablet Commonly known as: NORCO/VICODIN Take 1 tablet by mouth 3 (three) times daily.   potassium chloride SA 20 MEQ tablet Commonly known as: KLOR-CON Take 20 mEq by mouth daily.   predniSONE 1 MG tablet Commonly known as: DELTASONE Take 2.5 mg by mouth daily.   Rivaroxaban 15 & 20 MG Tbpk Follow package directions: Take one 15mg  tablet by mouth twice a day. On day 22, switch to one 20mg  tablet once a day. Take with food.   Trelegy Ellipta 100-62.5-25 MCG/INH Aepb Generic drug: Fluticasone-Umeclidin-Vilant Inhale 1 puff into the lungs every evening.   triamcinolone cream 0.1 % Commonly known as: KENALOG Apply 1 application topically daily as needed for itching.   valACYclovir 500 MG tablet Commonly known as: VALTREX Take 500 mg by mouth 2 (two) times daily.   venlafaxine XR 37.5 MG 24 hr capsule Commonly known as: EFFEXOR-XR Take 37.5 mg by mouth at bedtime.      Allergies  Allergen Reactions  . Other Other (See Comments)    "CILLIN" Family Has patient had a PCN reaction causing immediate rash,  facial/tongue/throat swelling, SOB or lightheadedness with hypotension: no Has patient had a PCN reaction causing severe rash involving mucus membranes or skin necrosis: yes Has patient had a PCN reaction that required hospitalization: no Has patient had a PCN reaction occurring within the last 10 years: no If all of the above answers are "NO", then may proceed with Cephalosporin use.   . Aspirin Other (See Comments)    Gi upset  . Naproxen Sodium Other (See Comments)    Whelps in large doses **ALEVE**   Follow-up Information    [facility]   Specialty: Home Health Services Why: HH nursing/physical therapy/aide           The results of significant diagnostics from this hospitalization (including imaging, microbiology, ancillary and laboratory) are listed below for reference.    Significant Diagnostic Studies: CT Angio Chest PE W and/or Wo Contrast  Result Date: 12/02/2019 CLINICAL DATA:  Shortness of breath, elevated D-dimer, history of COPD EXAM: CT ANGIOGRAPHY CHEST WITH CONTRAST TECHNIQUE: Multidetector CT imaging of the chest was performed using the standard protocol during bolus administration of intravenous contrast. Multiplanar CT image reconstructions  and MIPs were obtained to evaluate the vascular anatomy. CONTRAST:  68mL OMNIPAQUE IOHEXOL 350 MG/ML SOLN COMPARISON:  12/02/2019, 09/07/2019 FINDINGS: Cardiovascular: This is a technically adequate evaluation of the pulmonary vasculature. There are bilateral segmental pulmonary emboli greatest in the right upper and left lower lobe distributions. Moderate clot burden. There is straightening of the interventricular septum with no frank right ventricular dilatation at this time. Thoracic aorta is normal in caliber. Mild atherosclerosis of the aorta and coronary vessels. No pericardial effusion. Mediastinum/Nodes: No pathologic mediastinal or hilar adenopathy. Lungs/Pleura: There is severe emphysema, with  chronic bibasilar scarring and fibrosis. Increased consolidation within the right lower lobe is felt to be hypoventilatory rather than airspace disease or infarct. No effusion or pneumothorax. Central airways are patent. There is mucoid material within the bronchus intermedius. Upper Abdomen: Calcified gallstones are identified without cholecystitis. The remainder of the upper abdomen is unremarkable. Musculoskeletal: There are no acute displaced fractures. The reconstructed images demonstrate no additional findings. Review of the MIP images confirms the above findings. IMPRESSION: 1. Bilateral segmental pulmonary emboli. There is straightening of the interventricular septum consistent with early right heart strain. 2. Severe background emphysema, with bibasilar scarring and atelectasis. 3. Cholelithiasis without cholecystitis. These results were called by telephone at the time of interpretation on 12/02/2019 at 5:06 pm to provider [facility] , who verbally acknowledged these results. Electronically Signed   By: Randa Ngo M.D.   On: 12/02/2019 17:06   DG Chest Port 1 View  Result Date: 12/02/2019 CLINICAL DATA:  Dyspnea, low O2 sats. EXAM: PORTABLE CHEST 1 VIEW COMPARISON:  Chest x-rays dated 10/01/2019 and 08/29/2019. FINDINGS: Stable bibasilar scarring/fibrosis. No new lung findings. No pleural effusion or pneumothorax is seen. Heart size and mediastinal contours are stable. Osseous structures about the chest are unremarkable. IMPRESSION: No active disease. Stable bibasilar scarring/fibrosis. Electronically Signed   By: Franki Cabot M.D.   On: 12/02/2019 14:56   ECHOCARDIOGRAM COMPLETE  Result Date: 12/03/2019    ECHOCARDIOGRAM REPORT   Patient Name:   Kenneth [name] Date of Exam: 12/03/2019 Medical Rec #:  [id number]     Height:       70.0 in Accession #:    [id number]    Weight:       138.4 lb Date of Birth:  [birth year]      BSA:          1.79 m Patient Age:    89 years      BP:           129/80 mmHg  Patient Gender: M             HR:           93 bpm. Exam Location:  Inpatient Procedure: 2D Echo, Color Doppler and Cardiac Doppler Indications:    I26.02 Pulmonary embolus  History:        Patient has no prior history of Echocardiogram examinations.                 COPD; Risk Factors:Dyslipidemia.  Sonographer:    Raquel Sarna Senior RDCS Referring Phys: [id number] [place]  1. Left ventricular ejection fraction, by estimation, is 60 to 65%. The left ventricle has normal function. The left ventricle has no regional wall motion abnormalities. Left ventricular diastolic parameters are consistent with Grade I diastolic dysfunction (impaired relaxation).  2. Right ventricular systolic function is moderately reduced. The right ventricular size is moderately enlarged. There is normal pulmonary artery systolic  pressure. The estimated right ventricular systolic pressure is 62.7 mmHg.  3. The mitral valve is grossly normal. No evidence of mitral valve regurgitation. No evidence of mitral stenosis.  4. The aortic valve is tricuspid. Aortic valve regurgitation is not visualized. No aortic stenosis is present.  5. The inferior vena cava is normal in size with greater than 50% respiratory variability, suggesting right atrial pressure of 3 mmHg. Comparison(s): No prior Echocardiogram. FINDINGS  Left Ventricle: Left ventricular ejection fraction, by estimation, is 60 to 65%. The left ventricle has normal function. The left ventricle has no regional wall motion abnormalities. The left ventricular internal cavity size was normal in size. There is  no left ventricular hypertrophy. Left ventricular diastolic parameters are consistent with Grade I diastolic dysfunction (impaired relaxation). Right Ventricle: The right ventricular size is moderately enlarged. No increase in right ventricular wall thickness. Right ventricular systolic function is moderately reduced. There is normal pulmonary artery systolic pressure. The tricuspid  regurgitant velocity is 2.76 m/s, and with an assumed right atrial pressure of 3 mmHg, the estimated right ventricular systolic pressure is 03.5 mmHg. Left Atrium: Left atrial size was normal in size. Right Atrium: Right atrial size was normal in size. Pericardium: There is no evidence of pericardial effusion. Mitral Valve: The mitral valve is grossly normal. No evidence of mitral valve regurgitation. No evidence of mitral valve stenosis. Tricuspid Valve: The tricuspid valve is grossly normal. Tricuspid valve regurgitation is not demonstrated. No evidence of tricuspid stenosis. Aortic Valve: The aortic valve is tricuspid. Aortic valve regurgitation is not visualized. No aortic stenosis is present. Pulmonic Valve: The pulmonic valve was grossly normal. Pulmonic valve regurgitation is not visualized. No evidence of pulmonic stenosis. Aorta: The aortic root and ascending aorta are structurally normal, with no evidence of dilitation. Venous: The inferior vena cava is normal in size with greater than 50% respiratory variability, suggesting right atrial pressure of 3 mmHg. IAS/Shunts: No atrial level shunt detected by color flow Doppler. EKG: Rhythm strip during this exam demostrated normal sinus rhythm and premature ventricular contractions.  LEFT VENTRICLE PLAX 2D LVIDd:         3.90 cm LVIDs:         2.90 cm LV PW:         0.80 cm LV IVS:        0.80 cm LVOT diam:     2.10 cm LV SV:         51.26 ml LV SV Index:   19.22 LVOT Area:     3.46 cm  RIGHT VENTRICLE RV S prime:     4.57 cm/s TAPSE (M-mode): 1.3 cm LEFT ATRIUM             Index       RIGHT ATRIUM           Index LA diam:        2.20 cm 1.23 cm/m  RA Area:     10.50 cm LA Vol (A2C):   35.7 ml 20.00 ml/m RA Volume:   21.70 ml  12.16 ml/m LA Vol (A4C):   27.4 ml 15.35 ml/m LA Biplane Vol: 32.3 ml 18.09 ml/m  AORTIC VALVE LVOT Vmax:   77.47 cm/s LVOT Vmean:  54.033 cm/s LVOT VTI:    0.148 m  AORTA Ao Root diam: 2.80 cm Ao Asc diam:  3.20 cm TRICUSPID VALVE  TR Peak grad:   30.5 mmHg TR Vmax:        276.00 cm/s  SHUNTS  Systemic VTI:  0.15 m Systemic Diam: 2.10 cm Eleonore Chiquito MD Electronically signed by Eleonore Chiquito MD Signature Date/Time: 12/03/2019/11:16:38 AM    Final    VAS [place] LOWER EXTREMITY VENOUS (DVT)  Result Date: 12/03/2019  Lower Venous DVTStudy Indications: Pulmonary embolism.  Risk Factors: None identified. Comparison Study: No prior studies. Performing Technologist: Oliver Hum RVT  Examination Guidelines: A complete evaluation includes B-mode imaging, spectral Doppler, color Doppler, and power Doppler as needed of all accessible portions of each vessel. Bilateral testing is considered an integral part of a complete examination. Limited examinations for reoccurring indications may be performed as noted. The reflux portion of the exam is performed with the patient in reverse Trendelenburg.  +---------+---------------+---------+-----------+----------+--------------+ RIGHT    CompressibilityPhasicitySpontaneityPropertiesThrombus Aging +---------+---------------+---------+-----------+----------+--------------+ CFV      Full           Yes      Yes                                 +---------+---------------+---------+-----------+----------+--------------+ SFJ      Full                                                        +---------+---------------+---------+-----------+----------+--------------+ FV Prox  Full                                                        +---------+---------------+---------+-----------+----------+--------------+ FV Mid   Full                                                        +---------+---------------+---------+-----------+----------+--------------+ FV DistalFull                                                        +---------+---------------+---------+-----------+----------+--------------+ PFV      Full                                                         +---------+---------------+---------+-----------+----------+--------------+ POP      Full           Yes      Yes                                 +---------+---------------+---------+-----------+----------+--------------+ PTV      Full                                                        +---------+---------------+---------+-----------+----------+--------------+  PERO     Full                                                        +---------+---------------+---------+-----------+----------+--------------+   +---------+---------------+---------+-----------+----------+--------------+ LEFT     CompressibilityPhasicitySpontaneityPropertiesThrombus Aging +---------+---------------+---------+-----------+----------+--------------+ CFV      Full           Yes      Yes                                 +---------+---------------+---------+-----------+----------+--------------+ SFJ      Full                                                        +---------+---------------+---------+-----------+----------+--------------+ FV Prox  Partial        No       No                   Acute          +---------+---------------+---------+-----------+----------+--------------+ FV Mid   None           No       No                   Acute          +---------+---------------+---------+-----------+----------+--------------+ FV DistalNone           No       No                   Acute          +---------+---------------+---------+-----------+----------+--------------+ PFV      Full                                                        +---------+---------------+---------+-----------+----------+--------------+ POP      None           No       No                   Acute          +---------+---------------+---------+-----------+----------+--------------+ PTV      Partial                                      Acute           +---------+---------------+---------+-----------+----------+--------------+ PERO     Partial                                      Acute          +---------+---------------+---------+-----------+----------+--------------+     Summary: RIGHT: - There is no evidence of deep vein thrombosis in the lower extremity.  - No cystic structure found in the popliteal fossa.  LEFT: -  Findings consistent with acute deep vein thrombosis involving the left femoral vein, left popliteal vein, left posterior tibial veins, and left peroneal veins. - No cystic structure found in the popliteal fossa.  *See table(s) above for measurements and observations. Electronically signed by Ruta Hinds MD on 12/03/2019 at 5:22:45 PM.    Final     Microbiology: Recent Results (from the past 240 hour(s))  Respiratory Panel by RT PCR (Flu A&B, Covid) - Nasopharyngeal Swab     Status: None   Collection Time: 12/02/19  3:52 PM   Specimen: Nasopharyngeal Swab  Result Value Ref Range Status   SARS Coronavirus 2 by RT PCR NEGATIVE NEGATIVE Final    Comment: (NOTE) SARS-CoV-2 target nucleic acids are NOT DETECTED. The SARS-CoV-2 RNA is generally detectable in upper respiratoy specimens during the acute phase of infection. The lowest concentration of SARS-CoV-2 viral copies this assay can detect is 131 copies/mL. A negative result does not preclude SARS-Cov-2 infection and should not be used as the sole basis for treatment or other patient management decisions. A negative result may occur with  improper specimen collection/handling, submission of specimen other than nasopharyngeal swab, presence of viral mutation(s) within the areas targeted by this assay, and inadequate number of viral copies (<131 copies/mL). A negative result must be combined with clinical observations, patient history, and epidemiological information. The expected result is Negative. Fact Sheet for Patients:   PinkCheek.be Fact Sheet for Healthcare Providers:  GravelBags.it This test is not yet ap proved or cleared by the [place] FDA and  has been authorized for detection and/or diagnosis of SARS-CoV-2 by FDA under an Emergency Use Authorization (EUA). This EUA will remain  in effect (meaning this test can be used) for the duration of the COVID-19 declaration under Section 564(b)(1) of the Act, 21 U.S.C. section 360bbb-3(b)(1), unless the authorization is terminated or revoked sooner.    Influenza A by PCR NEGATIVE NEGATIVE Final   Influenza B by PCR NEGATIVE NEGATIVE Final    Comment: (NOTE) The Xpert Xpress SARS-CoV-2/FLU/RSV assay is intended as an aid in  the diagnosis of influenza from Nasopharyngeal swab specimens and  should not be used as a sole basis for treatment. Nasal washings and  aspirates are unacceptable for Xpert Xpress SARS-CoV-2/FLU/RSV  testing. Fact Sheet for Patients: PinkCheek.be Fact Sheet for Healthcare Providers: GravelBags.it This test is not yet approved or cleared by the [place] FDA and  has been authorized for detection and/or diagnosis of SARS-CoV-2 by  FDA under an Emergency Use Authorization (EUA). This EUA will remain  in effect (meaning this test can be used) for the duration of the  Covid-19 declaration under Section 564(b)(1) of the Act, 21  U.S.C. section 360bbb-3(b)(1), unless the authorization is  terminated or revoked. Performed at [facility], [place] [address] [place], [place] [address]      Labs: Basic Metabolic Panel: Recent Labs  Lab 12/02/19 1345 12/03/19 0210 12/05/19 0612  NA 141 139 136  K 3.8 4.9 4.3  CL 104 105 102  CO2 26 25 24   GLUCOSE 145* 120* 81  BUN 16 14 20   CREATININE 0.97 0.95 0.86  CALCIUM 9.4 9.0 8.7*   Liver Function Tests: Recent Labs  Lab 12/05/19 0612  AST 24   ALT 33  ALKPHOS 77  BILITOT 0.5  PROT 6.1*  ALBUMIN 2.8*   No results for input(s): LIPASE, AMYLASE in the last 168 hours. No results for input(s): AMMONIA in the last 168 hours. CBC:  Recent Labs  Lab 12/02/19 1345 12/03/19 0210 12/04/19 0745 12/05/19 0612  WBC 12.1* 10.4 8.8 6.5  HGB 15.6 14.5 13.6 13.4  HCT 47.8 44.0 41.1 40.8  MCV 118.6* 117.6* 118.8* 117.9*  PLT 150 129* 120* 124*   Cardiac Enzymes: No results for input(s): CKTOTAL, CKMB, CKMBINDEX, TROPONINI in the last 168 hours. BNP: BNP (last 3 results) Recent Labs    12/02/19 1345  BNP 353.4*    ProBNP (last 3 results) No results for input(s): PROBNP in the last 8760 hours.  CBG: No results for input(s): GLUCAP in the last 168 hours.     Signed:  Lelon Frohlich  [facility] Pager: [phone] 12/05/2019, 1:15 PM

## 2019-12-05 NOTE — Discharge Instructions (Signed)

## 2019-12-05 NOTE — TOC Transition Note (Signed)
Transition of Care [facility]) - CM/SW Discharge Note   Patient Details  Name: Kenneth [name] MRN: [id number] Date of Birth: 02-26-1946  [facility] of [facility]) CM/SW Contact:  Dessa Phi, RN Phone Number: 12/05/2019, 1:56 PM   Clinical Narrative:  Awaiting 02 sats to be documented.D/c home w/home 02-[employer] rep Zach aware to bring home 02 travel tank to rm prior d/c, & home set up.[facility] already following for [place].No further CM needs.       Barriers to Discharge: No Barriers Identified   Patient Goals and CMS Choice        Discharge Placement                       Discharge Plan and Services                DME Arranged: Oxygen DME Agency: [facility] Date DME Agency Contacted: 12/05/19 Time DME Agency Contacted: [phone] Representative spoke with at DME Agency: [place] ([place]) Interventions     Readmission Risk Interventions No flowsheet data found.

## 2019-12-06 ENCOUNTER — Other Ambulatory Visit: Payer: Self-pay | Admitting: Radiation Oncology

## 2019-12-06 DIAGNOSIS — C3432 Malignant neoplasm of lower lobe, left bronchus or lung: Secondary | ICD-10-CM

## 2019-12-07 DIAGNOSIS — C3432 Malignant neoplasm of lower lobe, left bronchus or lung: Secondary | ICD-10-CM | POA: Diagnosis not present

## 2019-12-07 DIAGNOSIS — M0579 Rheumatoid arthritis with rheumatoid factor of multiple sites without organ or systems involvement: Secondary | ICD-10-CM | POA: Diagnosis not present

## 2019-12-07 DIAGNOSIS — J8410 Pulmonary fibrosis, unspecified: Secondary | ICD-10-CM | POA: Diagnosis not present

## 2019-12-07 DIAGNOSIS — J449 Chronic obstructive pulmonary disease, unspecified: Secondary | ICD-10-CM | POA: Diagnosis not present

## 2019-12-07 DIAGNOSIS — F410 Panic disorder [episodic paroxysmal anxiety] without agoraphobia: Secondary | ICD-10-CM | POA: Diagnosis not present

## 2019-12-07 DIAGNOSIS — I10 Essential (primary) hypertension: Secondary | ICD-10-CM | POA: Diagnosis not present

## 2019-12-08 DIAGNOSIS — C3432 Malignant neoplasm of lower lobe, left bronchus or lung: Secondary | ICD-10-CM | POA: Diagnosis not present

## 2019-12-08 DIAGNOSIS — M0579 Rheumatoid arthritis with rheumatoid factor of multiple sites without organ or systems involvement: Secondary | ICD-10-CM | POA: Diagnosis not present

## 2019-12-08 DIAGNOSIS — F410 Panic disorder [episodic paroxysmal anxiety] without agoraphobia: Secondary | ICD-10-CM | POA: Diagnosis not present

## 2019-12-08 DIAGNOSIS — I10 Essential (primary) hypertension: Secondary | ICD-10-CM | POA: Diagnosis not present

## 2019-12-08 DIAGNOSIS — J449 Chronic obstructive pulmonary disease, unspecified: Secondary | ICD-10-CM | POA: Diagnosis not present

## 2019-12-08 DIAGNOSIS — J8410 Pulmonary fibrosis, unspecified: Secondary | ICD-10-CM | POA: Diagnosis not present

## 2019-12-10 DIAGNOSIS — C3432 Malignant neoplasm of lower lobe, left bronchus or lung: Secondary | ICD-10-CM | POA: Diagnosis not present

## 2019-12-10 DIAGNOSIS — J449 Chronic obstructive pulmonary disease, unspecified: Secondary | ICD-10-CM | POA: Diagnosis not present

## 2019-12-10 DIAGNOSIS — J8410 Pulmonary fibrosis, unspecified: Secondary | ICD-10-CM | POA: Diagnosis not present

## 2019-12-10 DIAGNOSIS — F410 Panic disorder [episodic paroxysmal anxiety] without agoraphobia: Secondary | ICD-10-CM | POA: Diagnosis not present

## 2019-12-10 DIAGNOSIS — M0579 Rheumatoid arthritis with rheumatoid factor of multiple sites without organ or systems involvement: Secondary | ICD-10-CM | POA: Diagnosis not present

## 2019-12-10 DIAGNOSIS — I10 Essential (primary) hypertension: Secondary | ICD-10-CM | POA: Diagnosis not present

## 2019-12-11 ENCOUNTER — Telehealth: Payer: Self-pay

## 2019-12-11 DIAGNOSIS — J8410 Pulmonary fibrosis, unspecified: Secondary | ICD-10-CM | POA: Diagnosis not present

## 2019-12-11 DIAGNOSIS — J449 Chronic obstructive pulmonary disease, unspecified: Secondary | ICD-10-CM | POA: Diagnosis not present

## 2019-12-11 DIAGNOSIS — M0579 Rheumatoid arthritis with rheumatoid factor of multiple sites without organ or systems involvement: Secondary | ICD-10-CM | POA: Diagnosis not present

## 2019-12-11 DIAGNOSIS — F410 Panic disorder [episodic paroxysmal anxiety] without agoraphobia: Secondary | ICD-10-CM | POA: Diagnosis not present

## 2019-12-11 DIAGNOSIS — I10 Essential (primary) hypertension: Secondary | ICD-10-CM | POA: Diagnosis not present

## 2019-12-11 DIAGNOSIS — C3432 Malignant neoplasm of lower lobe, left bronchus or lung: Secondary | ICD-10-CM | POA: Diagnosis not present

## 2019-12-11 NOTE — Telephone Encounter (Signed)
Contacted pt's wife to convey that f/u appt with Dr. Sondra Come on 2/25 was canceled as Dr. Sondra Come had reviewed CT scan that was done while pt was inpatient for PE. Conveyed to wife that per Dr. Sondra Come, CT showed no tumor and plan was for CT scan in 3 months with f/u appt with Dr. Sondra Come. Wife verbalized understanding and agreement. Loma Sousa, RN BSN

## 2019-12-12 DIAGNOSIS — J449 Chronic obstructive pulmonary disease, unspecified: Secondary | ICD-10-CM | POA: Diagnosis not present

## 2019-12-12 DIAGNOSIS — C3432 Malignant neoplasm of lower lobe, left bronchus or lung: Secondary | ICD-10-CM | POA: Diagnosis not present

## 2019-12-12 DIAGNOSIS — F410 Panic disorder [episodic paroxysmal anxiety] without agoraphobia: Secondary | ICD-10-CM | POA: Diagnosis not present

## 2019-12-12 DIAGNOSIS — M0579 Rheumatoid arthritis with rheumatoid factor of multiple sites without organ or systems involvement: Secondary | ICD-10-CM | POA: Diagnosis not present

## 2019-12-12 DIAGNOSIS — I10 Essential (primary) hypertension: Secondary | ICD-10-CM | POA: Diagnosis not present

## 2019-12-12 DIAGNOSIS — J8410 Pulmonary fibrosis, unspecified: Secondary | ICD-10-CM | POA: Diagnosis not present

## 2019-12-13 ENCOUNTER — Ambulatory Visit: Payer: Medicare Other | Admitting: Radiation Oncology

## 2019-12-13 ENCOUNTER — Other Ambulatory Visit: Payer: Medicare Other | Admitting: Licensed Clinical Social Worker

## 2019-12-13 ENCOUNTER — Other Ambulatory Visit: Payer: Self-pay

## 2019-12-13 DIAGNOSIS — Z515 Encounter for palliative care: Secondary | ICD-10-CM

## 2019-12-14 NOTE — Progress Notes (Signed)
COMMUNITY PALLIATIVE CARE SW NOTE  PATIENT NAME: Kenneth [name] DOB: 1946-06-28 MRN: [id number]  PRIMARY CARE PROVIDER: [name], MD  RESPONSIBLE PARTY:  Acct ID - Guarantor Home Phone Work Phone Relationship Acct Type  [id number] - [name],[name]  Self P/F     [address], [place], [place] [address]   Due to the COVID-19 crisis, this virtual check-in visit was done via telephone from my office and it was initiated and consent given by thispatient and or family.  PLAN OF CARE and INTERVENTIONS:             1. GOALS OF CARE/ ADVANCE CARE PLANNING:  Goal is for patient to remain at home.  He has a DNR. 2. SOCIAL/EMOTIONAL/SPIRITUAL ASSESSMENT/ INTERVENTIONS:  SW conducted a Sales executive visit with patient wife, Kenneth [name].  She discussed patient's recent hospitalization.  She states he is sleeping more.  Patient will eat and drink "what he wants".  Kenneth [name] stated she is "hanging on".  SW provided active listening and supportive counseling. 3. PATIENT/CAREGIVER EDUCATION/ COPING:  Wife copes by problem-solving. 4. PERSONAL EMERGENCY PLAN:  EMS will be contacted.  Patient remains in bed if he is too tired. 5. COMMUNITY RESOURCES COORDINATION/ HEALTH CARE NAVIGATION:  [facility] provides aid services. 6. FINANCIAL/LEGAL CONCERNS/INTERVENTIONS:  None.     SOCIAL HX:  Social History   Tobacco Use  . Smoking status: Former Smoker    Packs/day: 0.50    Years: 44.00    Pack years: 22.00    Types: Cigarettes    Quit date: 12/05/2017    Years since quitting: 2.0  . Smokeless tobacco: Never Used  . Tobacco comment: "quit cold [place]"  Substance Use Topics  . Alcohol use: No    CODE STATUS:  DNR ADVANCED DIRECTIVES: N MOST FORM COMPLETE:  N HOSPICE EDUCATION PROVIDED: N PPS:  Appetite is normal.  He does not ambulate. Duration of visit and documentation:  30 minutes.      Creola Corn Janssen Zee, LCSW

## 2019-12-17 DIAGNOSIS — I10 Essential (primary) hypertension: Secondary | ICD-10-CM | POA: Diagnosis not present

## 2019-12-17 DIAGNOSIS — M0579 Rheumatoid arthritis with rheumatoid factor of multiple sites without organ or systems involvement: Secondary | ICD-10-CM | POA: Diagnosis not present

## 2019-12-17 DIAGNOSIS — F410 Panic disorder [episodic paroxysmal anxiety] without agoraphobia: Secondary | ICD-10-CM | POA: Diagnosis not present

## 2019-12-17 DIAGNOSIS — E876 Hypokalemia: Secondary | ICD-10-CM | POA: Diagnosis not present

## 2019-12-17 DIAGNOSIS — J449 Chronic obstructive pulmonary disease, unspecified: Secondary | ICD-10-CM | POA: Diagnosis not present

## 2019-12-17 DIAGNOSIS — J8410 Pulmonary fibrosis, unspecified: Secondary | ICD-10-CM | POA: Diagnosis not present

## 2019-12-17 DIAGNOSIS — C3432 Malignant neoplasm of lower lobe, left bronchus or lung: Secondary | ICD-10-CM | POA: Diagnosis not present

## 2019-12-18 DIAGNOSIS — N390 Urinary tract infection, site not specified: Secondary | ICD-10-CM | POA: Diagnosis not present

## 2019-12-18 DIAGNOSIS — R5383 Other fatigue: Secondary | ICD-10-CM | POA: Diagnosis not present

## 2019-12-19 DIAGNOSIS — M0579 Rheumatoid arthritis with rheumatoid factor of multiple sites without organ or systems involvement: Secondary | ICD-10-CM | POA: Diagnosis not present

## 2019-12-19 DIAGNOSIS — I10 Essential (primary) hypertension: Secondary | ICD-10-CM | POA: Diagnosis not present

## 2019-12-19 DIAGNOSIS — J449 Chronic obstructive pulmonary disease, unspecified: Secondary | ICD-10-CM | POA: Diagnosis not present

## 2019-12-19 DIAGNOSIS — F410 Panic disorder [episodic paroxysmal anxiety] without agoraphobia: Secondary | ICD-10-CM | POA: Diagnosis not present

## 2019-12-19 DIAGNOSIS — C3432 Malignant neoplasm of lower lobe, left bronchus or lung: Secondary | ICD-10-CM | POA: Diagnosis not present

## 2019-12-19 DIAGNOSIS — J8410 Pulmonary fibrosis, unspecified: Secondary | ICD-10-CM | POA: Diagnosis not present

## 2019-12-21 DIAGNOSIS — I82409 Acute embolism and thrombosis of unspecified deep veins of unspecified lower extremity: Secondary | ICD-10-CM | POA: Diagnosis not present

## 2019-12-21 DIAGNOSIS — B029 Zoster without complications: Secondary | ICD-10-CM | POA: Diagnosis not present

## 2019-12-21 DIAGNOSIS — J159 Unspecified bacterial pneumonia: Secondary | ICD-10-CM | POA: Diagnosis not present

## 2019-12-21 DIAGNOSIS — I2699 Other pulmonary embolism without acute cor pulmonale: Secondary | ICD-10-CM | POA: Diagnosis not present

## 2019-12-21 DIAGNOSIS — J84112 Idiopathic pulmonary fibrosis: Secondary | ICD-10-CM | POA: Diagnosis not present

## 2019-12-21 DIAGNOSIS — Z9981 Dependence on supplemental oxygen: Secondary | ICD-10-CM | POA: Diagnosis not present

## 2019-12-21 DIAGNOSIS — E785 Hyperlipidemia, unspecified: Secondary | ICD-10-CM | POA: Diagnosis not present

## 2019-12-21 DIAGNOSIS — C3492 Malignant neoplasm of unspecified part of left bronchus or lung: Secondary | ICD-10-CM | POA: Diagnosis not present

## 2019-12-21 DIAGNOSIS — I10 Essential (primary) hypertension: Secondary | ICD-10-CM | POA: Diagnosis not present

## 2019-12-21 DIAGNOSIS — Z87891 Personal history of nicotine dependence: Secondary | ICD-10-CM | POA: Diagnosis not present

## 2019-12-21 DIAGNOSIS — E43 Unspecified severe protein-calorie malnutrition: Secondary | ICD-10-CM | POA: Diagnosis not present

## 2019-12-21 DIAGNOSIS — F329 Major depressive disorder, single episode, unspecified: Secondary | ICD-10-CM | POA: Diagnosis not present

## 2019-12-21 DIAGNOSIS — N390 Urinary tract infection, site not specified: Secondary | ICD-10-CM | POA: Diagnosis not present

## 2019-12-21 DIAGNOSIS — M069 Rheumatoid arthritis, unspecified: Secondary | ICD-10-CM | POA: Diagnosis not present

## 2019-12-21 DIAGNOSIS — J449 Chronic obstructive pulmonary disease, unspecified: Secondary | ICD-10-CM | POA: Diagnosis not present

## 2019-12-22 DIAGNOSIS — C3492 Malignant neoplasm of unspecified part of left bronchus or lung: Secondary | ICD-10-CM | POA: Diagnosis not present

## 2019-12-22 DIAGNOSIS — J84112 Idiopathic pulmonary fibrosis: Secondary | ICD-10-CM | POA: Diagnosis not present

## 2019-12-22 DIAGNOSIS — E43 Unspecified severe protein-calorie malnutrition: Secondary | ICD-10-CM | POA: Diagnosis not present

## 2019-12-22 DIAGNOSIS — J449 Chronic obstructive pulmonary disease, unspecified: Secondary | ICD-10-CM | POA: Diagnosis not present

## 2019-12-22 DIAGNOSIS — I2699 Other pulmonary embolism without acute cor pulmonale: Secondary | ICD-10-CM | POA: Diagnosis not present

## 2019-12-22 DIAGNOSIS — I82409 Acute embolism and thrombosis of unspecified deep veins of unspecified lower extremity: Secondary | ICD-10-CM | POA: Diagnosis not present

## 2019-12-24 DIAGNOSIS — J449 Chronic obstructive pulmonary disease, unspecified: Secondary | ICD-10-CM | POA: Diagnosis not present

## 2019-12-24 DIAGNOSIS — C3492 Malignant neoplasm of unspecified part of left bronchus or lung: Secondary | ICD-10-CM | POA: Diagnosis not present

## 2019-12-24 DIAGNOSIS — J84112 Idiopathic pulmonary fibrosis: Secondary | ICD-10-CM | POA: Diagnosis not present

## 2019-12-24 DIAGNOSIS — I82409 Acute embolism and thrombosis of unspecified deep veins of unspecified lower extremity: Secondary | ICD-10-CM | POA: Diagnosis not present

## 2019-12-24 DIAGNOSIS — I2699 Other pulmonary embolism without acute cor pulmonale: Secondary | ICD-10-CM | POA: Diagnosis not present

## 2019-12-24 DIAGNOSIS — E43 Unspecified severe protein-calorie malnutrition: Secondary | ICD-10-CM | POA: Diagnosis not present

## 2019-12-26 ENCOUNTER — Ambulatory Visit: Payer: Medicare Other | Admitting: Podiatry

## 2020-01-01 DIAGNOSIS — J449 Chronic obstructive pulmonary disease, unspecified: Secondary | ICD-10-CM | POA: Diagnosis not present

## 2020-01-01 DIAGNOSIS — I82409 Acute embolism and thrombosis of unspecified deep veins of unspecified lower extremity: Secondary | ICD-10-CM | POA: Diagnosis not present

## 2020-01-01 DIAGNOSIS — J84112 Idiopathic pulmonary fibrosis: Secondary | ICD-10-CM | POA: Diagnosis not present

## 2020-01-01 DIAGNOSIS — E43 Unspecified severe protein-calorie malnutrition: Secondary | ICD-10-CM | POA: Diagnosis not present

## 2020-01-01 DIAGNOSIS — C3492 Malignant neoplasm of unspecified part of left bronchus or lung: Secondary | ICD-10-CM | POA: Diagnosis not present

## 2020-01-01 DIAGNOSIS — I2699 Other pulmonary embolism without acute cor pulmonale: Secondary | ICD-10-CM | POA: Diagnosis not present

## 2020-01-04 DIAGNOSIS — I2699 Other pulmonary embolism without acute cor pulmonale: Secondary | ICD-10-CM | POA: Diagnosis not present

## 2020-01-04 DIAGNOSIS — J84112 Idiopathic pulmonary fibrosis: Secondary | ICD-10-CM | POA: Diagnosis not present

## 2020-01-04 DIAGNOSIS — C3492 Malignant neoplasm of unspecified part of left bronchus or lung: Secondary | ICD-10-CM | POA: Diagnosis not present

## 2020-01-04 DIAGNOSIS — J449 Chronic obstructive pulmonary disease, unspecified: Secondary | ICD-10-CM | POA: Diagnosis not present

## 2020-01-04 DIAGNOSIS — E43 Unspecified severe protein-calorie malnutrition: Secondary | ICD-10-CM | POA: Diagnosis not present

## 2020-01-04 DIAGNOSIS — I82409 Acute embolism and thrombosis of unspecified deep veins of unspecified lower extremity: Secondary | ICD-10-CM | POA: Diagnosis not present

## 2020-01-07 DIAGNOSIS — E43 Unspecified severe protein-calorie malnutrition: Secondary | ICD-10-CM | POA: Diagnosis not present

## 2020-01-07 DIAGNOSIS — I82409 Acute embolism and thrombosis of unspecified deep veins of unspecified lower extremity: Secondary | ICD-10-CM | POA: Diagnosis not present

## 2020-01-07 DIAGNOSIS — J449 Chronic obstructive pulmonary disease, unspecified: Secondary | ICD-10-CM | POA: Diagnosis not present

## 2020-01-07 DIAGNOSIS — C3492 Malignant neoplasm of unspecified part of left bronchus or lung: Secondary | ICD-10-CM | POA: Diagnosis not present

## 2020-01-07 DIAGNOSIS — J84112 Idiopathic pulmonary fibrosis: Secondary | ICD-10-CM | POA: Diagnosis not present

## 2020-01-07 DIAGNOSIS — I2699 Other pulmonary embolism without acute cor pulmonale: Secondary | ICD-10-CM | POA: Diagnosis not present

## 2020-01-08 DIAGNOSIS — E43 Unspecified severe protein-calorie malnutrition: Secondary | ICD-10-CM | POA: Diagnosis not present

## 2020-01-08 DIAGNOSIS — R3121 Asymptomatic microscopic hematuria: Secondary | ICD-10-CM | POA: Diagnosis not present

## 2020-01-08 DIAGNOSIS — C3492 Malignant neoplasm of unspecified part of left bronchus or lung: Secondary | ICD-10-CM | POA: Diagnosis not present

## 2020-01-08 DIAGNOSIS — I2699 Other pulmonary embolism without acute cor pulmonale: Secondary | ICD-10-CM | POA: Diagnosis not present

## 2020-01-08 DIAGNOSIS — J449 Chronic obstructive pulmonary disease, unspecified: Secondary | ICD-10-CM | POA: Diagnosis not present

## 2020-01-08 DIAGNOSIS — N390 Urinary tract infection, site not specified: Secondary | ICD-10-CM | POA: Diagnosis not present

## 2020-01-08 DIAGNOSIS — I82409 Acute embolism and thrombosis of unspecified deep veins of unspecified lower extremity: Secondary | ICD-10-CM | POA: Diagnosis not present

## 2020-01-08 DIAGNOSIS — J84112 Idiopathic pulmonary fibrosis: Secondary | ICD-10-CM | POA: Diagnosis not present

## 2020-01-15 DIAGNOSIS — I82409 Acute embolism and thrombosis of unspecified deep veins of unspecified lower extremity: Secondary | ICD-10-CM | POA: Diagnosis not present

## 2020-01-15 DIAGNOSIS — E43 Unspecified severe protein-calorie malnutrition: Secondary | ICD-10-CM | POA: Diagnosis not present

## 2020-01-15 DIAGNOSIS — C3492 Malignant neoplasm of unspecified part of left bronchus or lung: Secondary | ICD-10-CM | POA: Diagnosis not present

## 2020-01-15 DIAGNOSIS — I2699 Other pulmonary embolism without acute cor pulmonale: Secondary | ICD-10-CM | POA: Diagnosis not present

## 2020-01-15 DIAGNOSIS — J84112 Idiopathic pulmonary fibrosis: Secondary | ICD-10-CM | POA: Diagnosis not present

## 2020-01-15 DIAGNOSIS — J449 Chronic obstructive pulmonary disease, unspecified: Secondary | ICD-10-CM | POA: Diagnosis not present

## 2020-01-17 DIAGNOSIS — E785 Hyperlipidemia, unspecified: Secondary | ICD-10-CM | POA: Diagnosis not present

## 2020-01-17 DIAGNOSIS — I2699 Other pulmonary embolism without acute cor pulmonale: Secondary | ICD-10-CM | POA: Diagnosis not present

## 2020-01-17 DIAGNOSIS — J449 Chronic obstructive pulmonary disease, unspecified: Secondary | ICD-10-CM | POA: Diagnosis not present

## 2020-01-17 DIAGNOSIS — I10 Essential (primary) hypertension: Secondary | ICD-10-CM | POA: Diagnosis not present

## 2020-01-17 DIAGNOSIS — J159 Unspecified bacterial pneumonia: Secondary | ICD-10-CM | POA: Diagnosis not present

## 2020-01-17 DIAGNOSIS — E43 Unspecified severe protein-calorie malnutrition: Secondary | ICD-10-CM | POA: Diagnosis not present

## 2020-01-17 DIAGNOSIS — N390 Urinary tract infection, site not specified: Secondary | ICD-10-CM | POA: Diagnosis not present

## 2020-01-17 DIAGNOSIS — C3492 Malignant neoplasm of unspecified part of left bronchus or lung: Secondary | ICD-10-CM | POA: Diagnosis not present

## 2020-01-17 DIAGNOSIS — M069 Rheumatoid arthritis, unspecified: Secondary | ICD-10-CM | POA: Diagnosis not present

## 2020-01-17 DIAGNOSIS — I82409 Acute embolism and thrombosis of unspecified deep veins of unspecified lower extremity: Secondary | ICD-10-CM | POA: Diagnosis not present

## 2020-01-17 DIAGNOSIS — Z87891 Personal history of nicotine dependence: Secondary | ICD-10-CM | POA: Diagnosis not present

## 2020-01-17 DIAGNOSIS — F329 Major depressive disorder, single episode, unspecified: Secondary | ICD-10-CM | POA: Diagnosis not present

## 2020-01-17 DIAGNOSIS — J84112 Idiopathic pulmonary fibrosis: Secondary | ICD-10-CM | POA: Diagnosis not present

## 2020-01-17 DIAGNOSIS — Z9981 Dependence on supplemental oxygen: Secondary | ICD-10-CM | POA: Diagnosis not present

## 2020-01-17 DIAGNOSIS — B029 Zoster without complications: Secondary | ICD-10-CM | POA: Diagnosis not present

## 2020-01-21 DIAGNOSIS — M5489 Other dorsalgia: Secondary | ICD-10-CM | POA: Diagnosis not present

## 2020-01-21 DIAGNOSIS — J449 Chronic obstructive pulmonary disease, unspecified: Secondary | ICD-10-CM | POA: Diagnosis not present

## 2020-01-21 DIAGNOSIS — C3432 Malignant neoplasm of lower lobe, left bronchus or lung: Secondary | ICD-10-CM | POA: Diagnosis not present

## 2020-01-21 DIAGNOSIS — I10 Essential (primary) hypertension: Secondary | ICD-10-CM | POA: Diagnosis not present

## 2020-01-21 DIAGNOSIS — D61818 Other pancytopenia: Secondary | ICD-10-CM | POA: Diagnosis not present

## 2020-01-21 DIAGNOSIS — R627 Adult failure to thrive: Secondary | ICD-10-CM | POA: Diagnosis not present

## 2020-01-21 DIAGNOSIS — M069 Rheumatoid arthritis, unspecified: Secondary | ICD-10-CM | POA: Diagnosis not present

## 2020-01-21 DIAGNOSIS — F329 Major depressive disorder, single episode, unspecified: Secondary | ICD-10-CM | POA: Diagnosis not present

## 2020-01-21 DIAGNOSIS — F17200 Nicotine dependence, unspecified, uncomplicated: Secondary | ICD-10-CM | POA: Diagnosis not present

## 2020-01-21 DIAGNOSIS — E46 Unspecified protein-calorie malnutrition: Secondary | ICD-10-CM | POA: Diagnosis not present

## 2020-01-21 DIAGNOSIS — J8410 Pulmonary fibrosis, unspecified: Secondary | ICD-10-CM | POA: Diagnosis not present

## 2020-01-21 DIAGNOSIS — R0902 Hypoxemia: Secondary | ICD-10-CM | POA: Diagnosis not present

## 2020-01-22 DIAGNOSIS — I2699 Other pulmonary embolism without acute cor pulmonale: Secondary | ICD-10-CM | POA: Diagnosis not present

## 2020-01-22 DIAGNOSIS — E43 Unspecified severe protein-calorie malnutrition: Secondary | ICD-10-CM | POA: Diagnosis not present

## 2020-01-22 DIAGNOSIS — J84112 Idiopathic pulmonary fibrosis: Secondary | ICD-10-CM | POA: Diagnosis not present

## 2020-01-22 DIAGNOSIS — I82409 Acute embolism and thrombosis of unspecified deep veins of unspecified lower extremity: Secondary | ICD-10-CM | POA: Diagnosis not present

## 2020-01-22 DIAGNOSIS — J449 Chronic obstructive pulmonary disease, unspecified: Secondary | ICD-10-CM | POA: Diagnosis not present

## 2020-01-22 DIAGNOSIS — C3492 Malignant neoplasm of unspecified part of left bronchus or lung: Secondary | ICD-10-CM | POA: Diagnosis not present

## 2020-01-29 DIAGNOSIS — J84112 Idiopathic pulmonary fibrosis: Secondary | ICD-10-CM | POA: Diagnosis not present

## 2020-01-29 DIAGNOSIS — I2699 Other pulmonary embolism without acute cor pulmonale: Secondary | ICD-10-CM | POA: Diagnosis not present

## 2020-01-29 DIAGNOSIS — C3492 Malignant neoplasm of unspecified part of left bronchus or lung: Secondary | ICD-10-CM | POA: Diagnosis not present

## 2020-01-29 DIAGNOSIS — E43 Unspecified severe protein-calorie malnutrition: Secondary | ICD-10-CM | POA: Diagnosis not present

## 2020-01-29 DIAGNOSIS — J449 Chronic obstructive pulmonary disease, unspecified: Secondary | ICD-10-CM | POA: Diagnosis not present

## 2020-01-29 DIAGNOSIS — I82409 Acute embolism and thrombosis of unspecified deep veins of unspecified lower extremity: Secondary | ICD-10-CM | POA: Diagnosis not present

## 2020-02-05 DIAGNOSIS — J84112 Idiopathic pulmonary fibrosis: Secondary | ICD-10-CM | POA: Diagnosis not present

## 2020-02-05 DIAGNOSIS — J449 Chronic obstructive pulmonary disease, unspecified: Secondary | ICD-10-CM | POA: Diagnosis not present

## 2020-02-05 DIAGNOSIS — C3492 Malignant neoplasm of unspecified part of left bronchus or lung: Secondary | ICD-10-CM | POA: Diagnosis not present

## 2020-02-05 DIAGNOSIS — I2699 Other pulmonary embolism without acute cor pulmonale: Secondary | ICD-10-CM | POA: Diagnosis not present

## 2020-02-05 DIAGNOSIS — I82409 Acute embolism and thrombosis of unspecified deep veins of unspecified lower extremity: Secondary | ICD-10-CM | POA: Diagnosis not present

## 2020-02-05 DIAGNOSIS — E43 Unspecified severe protein-calorie malnutrition: Secondary | ICD-10-CM | POA: Diagnosis not present

## 2020-02-07 DIAGNOSIS — J449 Chronic obstructive pulmonary disease, unspecified: Secondary | ICD-10-CM | POA: Diagnosis not present

## 2020-02-07 DIAGNOSIS — I2699 Other pulmonary embolism without acute cor pulmonale: Secondary | ICD-10-CM | POA: Diagnosis not present

## 2020-02-07 DIAGNOSIS — I82409 Acute embolism and thrombosis of unspecified deep veins of unspecified lower extremity: Secondary | ICD-10-CM | POA: Diagnosis not present

## 2020-02-07 DIAGNOSIS — E43 Unspecified severe protein-calorie malnutrition: Secondary | ICD-10-CM | POA: Diagnosis not present

## 2020-02-07 DIAGNOSIS — J84112 Idiopathic pulmonary fibrosis: Secondary | ICD-10-CM | POA: Diagnosis not present

## 2020-02-07 DIAGNOSIS — C3492 Malignant neoplasm of unspecified part of left bronchus or lung: Secondary | ICD-10-CM | POA: Diagnosis not present

## 2020-02-11 DIAGNOSIS — J84112 Idiopathic pulmonary fibrosis: Secondary | ICD-10-CM | POA: Diagnosis not present

## 2020-02-11 DIAGNOSIS — C3492 Malignant neoplasm of unspecified part of left bronchus or lung: Secondary | ICD-10-CM | POA: Diagnosis not present

## 2020-02-11 DIAGNOSIS — I82409 Acute embolism and thrombosis of unspecified deep veins of unspecified lower extremity: Secondary | ICD-10-CM | POA: Diagnosis not present

## 2020-02-11 DIAGNOSIS — J449 Chronic obstructive pulmonary disease, unspecified: Secondary | ICD-10-CM | POA: Diagnosis not present

## 2020-02-11 DIAGNOSIS — I2699 Other pulmonary embolism without acute cor pulmonale: Secondary | ICD-10-CM | POA: Diagnosis not present

## 2020-02-11 DIAGNOSIS — E43 Unspecified severe protein-calorie malnutrition: Secondary | ICD-10-CM | POA: Diagnosis not present

## 2020-02-12 DIAGNOSIS — J449 Chronic obstructive pulmonary disease, unspecified: Secondary | ICD-10-CM | POA: Diagnosis not present

## 2020-02-12 DIAGNOSIS — E43 Unspecified severe protein-calorie malnutrition: Secondary | ICD-10-CM | POA: Diagnosis not present

## 2020-02-12 DIAGNOSIS — I2699 Other pulmonary embolism without acute cor pulmonale: Secondary | ICD-10-CM | POA: Diagnosis not present

## 2020-02-12 DIAGNOSIS — J84112 Idiopathic pulmonary fibrosis: Secondary | ICD-10-CM | POA: Diagnosis not present

## 2020-02-12 DIAGNOSIS — I82409 Acute embolism and thrombosis of unspecified deep veins of unspecified lower extremity: Secondary | ICD-10-CM | POA: Diagnosis not present

## 2020-02-12 DIAGNOSIS — C3492 Malignant neoplasm of unspecified part of left bronchus or lung: Secondary | ICD-10-CM | POA: Diagnosis not present

## 2020-02-16 DIAGNOSIS — B029 Zoster without complications: Secondary | ICD-10-CM | POA: Diagnosis not present

## 2020-02-16 DIAGNOSIS — E43 Unspecified severe protein-calorie malnutrition: Secondary | ICD-10-CM | POA: Diagnosis not present

## 2020-02-16 DIAGNOSIS — F329 Major depressive disorder, single episode, unspecified: Secondary | ICD-10-CM | POA: Diagnosis not present

## 2020-02-16 DIAGNOSIS — J84112 Idiopathic pulmonary fibrosis: Secondary | ICD-10-CM | POA: Diagnosis not present

## 2020-02-16 DIAGNOSIS — J159 Unspecified bacterial pneumonia: Secondary | ICD-10-CM | POA: Diagnosis not present

## 2020-02-16 DIAGNOSIS — E785 Hyperlipidemia, unspecified: Secondary | ICD-10-CM | POA: Diagnosis not present

## 2020-02-16 DIAGNOSIS — M069 Rheumatoid arthritis, unspecified: Secondary | ICD-10-CM | POA: Diagnosis not present

## 2020-02-16 DIAGNOSIS — C3492 Malignant neoplasm of unspecified part of left bronchus or lung: Secondary | ICD-10-CM | POA: Diagnosis not present

## 2020-02-16 DIAGNOSIS — I82409 Acute embolism and thrombosis of unspecified deep veins of unspecified lower extremity: Secondary | ICD-10-CM | POA: Diagnosis not present

## 2020-02-16 DIAGNOSIS — J449 Chronic obstructive pulmonary disease, unspecified: Secondary | ICD-10-CM | POA: Diagnosis not present

## 2020-02-16 DIAGNOSIS — I10 Essential (primary) hypertension: Secondary | ICD-10-CM | POA: Diagnosis not present

## 2020-02-16 DIAGNOSIS — I2699 Other pulmonary embolism without acute cor pulmonale: Secondary | ICD-10-CM | POA: Diagnosis not present

## 2020-02-16 DIAGNOSIS — Z9981 Dependence on supplemental oxygen: Secondary | ICD-10-CM | POA: Diagnosis not present

## 2020-02-16 DIAGNOSIS — N390 Urinary tract infection, site not specified: Secondary | ICD-10-CM | POA: Diagnosis not present

## 2020-02-16 DIAGNOSIS — Z87891 Personal history of nicotine dependence: Secondary | ICD-10-CM | POA: Diagnosis not present

## 2020-02-19 DIAGNOSIS — E43 Unspecified severe protein-calorie malnutrition: Secondary | ICD-10-CM | POA: Diagnosis not present

## 2020-02-19 DIAGNOSIS — I2699 Other pulmonary embolism without acute cor pulmonale: Secondary | ICD-10-CM | POA: Diagnosis not present

## 2020-02-19 DIAGNOSIS — J449 Chronic obstructive pulmonary disease, unspecified: Secondary | ICD-10-CM | POA: Diagnosis not present

## 2020-02-19 DIAGNOSIS — I82409 Acute embolism and thrombosis of unspecified deep veins of unspecified lower extremity: Secondary | ICD-10-CM | POA: Diagnosis not present

## 2020-02-19 DIAGNOSIS — J84112 Idiopathic pulmonary fibrosis: Secondary | ICD-10-CM | POA: Diagnosis not present

## 2020-02-19 DIAGNOSIS — C3492 Malignant neoplasm of unspecified part of left bronchus or lung: Secondary | ICD-10-CM | POA: Diagnosis not present

## 2020-02-26 DIAGNOSIS — C3492 Malignant neoplasm of unspecified part of left bronchus or lung: Secondary | ICD-10-CM | POA: Diagnosis not present

## 2020-02-26 DIAGNOSIS — I82409 Acute embolism and thrombosis of unspecified deep veins of unspecified lower extremity: Secondary | ICD-10-CM | POA: Diagnosis not present

## 2020-02-26 DIAGNOSIS — J84112 Idiopathic pulmonary fibrosis: Secondary | ICD-10-CM | POA: Diagnosis not present

## 2020-02-26 DIAGNOSIS — J449 Chronic obstructive pulmonary disease, unspecified: Secondary | ICD-10-CM | POA: Diagnosis not present

## 2020-02-26 DIAGNOSIS — E43 Unspecified severe protein-calorie malnutrition: Secondary | ICD-10-CM | POA: Diagnosis not present

## 2020-02-26 DIAGNOSIS — I2699 Other pulmonary embolism without acute cor pulmonale: Secondary | ICD-10-CM | POA: Diagnosis not present

## 2020-03-04 ENCOUNTER — Ambulatory Visit (HOSPITAL_COMMUNITY): Payer: Medicare Other

## 2020-03-04 DIAGNOSIS — I82409 Acute embolism and thrombosis of unspecified deep veins of unspecified lower extremity: Secondary | ICD-10-CM | POA: Diagnosis not present

## 2020-03-04 DIAGNOSIS — C3492 Malignant neoplasm of unspecified part of left bronchus or lung: Secondary | ICD-10-CM | POA: Diagnosis not present

## 2020-03-04 DIAGNOSIS — J449 Chronic obstructive pulmonary disease, unspecified: Secondary | ICD-10-CM | POA: Diagnosis not present

## 2020-03-04 DIAGNOSIS — I2699 Other pulmonary embolism without acute cor pulmonale: Secondary | ICD-10-CM | POA: Diagnosis not present

## 2020-03-04 DIAGNOSIS — E43 Unspecified severe protein-calorie malnutrition: Secondary | ICD-10-CM | POA: Diagnosis not present

## 2020-03-04 DIAGNOSIS — J84112 Idiopathic pulmonary fibrosis: Secondary | ICD-10-CM | POA: Diagnosis not present

## 2020-03-06 ENCOUNTER — Ambulatory Visit: Payer: Medicare Other | Admitting: Radiation Oncology

## 2020-03-11 DIAGNOSIS — C3492 Malignant neoplasm of unspecified part of left bronchus or lung: Secondary | ICD-10-CM | POA: Diagnosis not present

## 2020-03-11 DIAGNOSIS — I2699 Other pulmonary embolism without acute cor pulmonale: Secondary | ICD-10-CM | POA: Diagnosis not present

## 2020-03-11 DIAGNOSIS — E43 Unspecified severe protein-calorie malnutrition: Secondary | ICD-10-CM | POA: Diagnosis not present

## 2020-03-11 DIAGNOSIS — J84112 Idiopathic pulmonary fibrosis: Secondary | ICD-10-CM | POA: Diagnosis not present

## 2020-03-11 DIAGNOSIS — J449 Chronic obstructive pulmonary disease, unspecified: Secondary | ICD-10-CM | POA: Diagnosis not present

## 2020-03-11 DIAGNOSIS — I82409 Acute embolism and thrombosis of unspecified deep veins of unspecified lower extremity: Secondary | ICD-10-CM | POA: Diagnosis not present

## 2020-03-12 DIAGNOSIS — C3432 Malignant neoplasm of lower lobe, left bronchus or lung: Secondary | ICD-10-CM | POA: Diagnosis not present

## 2020-03-12 DIAGNOSIS — R0902 Hypoxemia: Secondary | ICD-10-CM | POA: Diagnosis not present

## 2020-03-12 DIAGNOSIS — M069 Rheumatoid arthritis, unspecified: Secondary | ICD-10-CM | POA: Diagnosis not present

## 2020-03-12 DIAGNOSIS — F419 Anxiety disorder, unspecified: Secondary | ICD-10-CM | POA: Diagnosis not present

## 2020-03-12 DIAGNOSIS — I10 Essential (primary) hypertension: Secondary | ICD-10-CM | POA: Diagnosis not present

## 2020-03-12 DIAGNOSIS — K5900 Constipation, unspecified: Secondary | ICD-10-CM | POA: Diagnosis not present

## 2020-03-12 DIAGNOSIS — J8410 Pulmonary fibrosis, unspecified: Secondary | ICD-10-CM | POA: Diagnosis not present

## 2020-03-12 DIAGNOSIS — M5489 Other dorsalgia: Secondary | ICD-10-CM | POA: Diagnosis not present

## 2020-03-12 DIAGNOSIS — F329 Major depressive disorder, single episode, unspecified: Secondary | ICD-10-CM | POA: Diagnosis not present

## 2020-03-12 DIAGNOSIS — J449 Chronic obstructive pulmonary disease, unspecified: Secondary | ICD-10-CM | POA: Diagnosis not present

## 2020-03-18 DIAGNOSIS — M069 Rheumatoid arthritis, unspecified: Secondary | ICD-10-CM | POA: Diagnosis not present

## 2020-03-18 DIAGNOSIS — F329 Major depressive disorder, single episode, unspecified: Secondary | ICD-10-CM | POA: Diagnosis not present

## 2020-03-18 DIAGNOSIS — Z9981 Dependence on supplemental oxygen: Secondary | ICD-10-CM | POA: Diagnosis not present

## 2020-03-18 DIAGNOSIS — I82409 Acute embolism and thrombosis of unspecified deep veins of unspecified lower extremity: Secondary | ICD-10-CM | POA: Diagnosis not present

## 2020-03-18 DIAGNOSIS — Z7401 Bed confinement status: Secondary | ICD-10-CM | POA: Diagnosis not present

## 2020-03-18 DIAGNOSIS — E785 Hyperlipidemia, unspecified: Secondary | ICD-10-CM | POA: Diagnosis not present

## 2020-03-18 DIAGNOSIS — Z87891 Personal history of nicotine dependence: Secondary | ICD-10-CM | POA: Diagnosis not present

## 2020-03-18 DIAGNOSIS — J84112 Idiopathic pulmonary fibrosis: Secondary | ICD-10-CM | POA: Diagnosis not present

## 2020-03-18 DIAGNOSIS — C3492 Malignant neoplasm of unspecified part of left bronchus or lung: Secondary | ICD-10-CM | POA: Diagnosis not present

## 2020-03-18 DIAGNOSIS — E43 Unspecified severe protein-calorie malnutrition: Secondary | ICD-10-CM | POA: Diagnosis not present

## 2020-03-18 DIAGNOSIS — I2699 Other pulmonary embolism without acute cor pulmonale: Secondary | ICD-10-CM | POA: Diagnosis not present

## 2020-03-18 DIAGNOSIS — J449 Chronic obstructive pulmonary disease, unspecified: Secondary | ICD-10-CM | POA: Diagnosis not present

## 2020-03-18 DIAGNOSIS — I10 Essential (primary) hypertension: Secondary | ICD-10-CM | POA: Diagnosis not present

## 2020-03-18 DIAGNOSIS — B029 Zoster without complications: Secondary | ICD-10-CM | POA: Diagnosis not present

## 2020-03-19 DIAGNOSIS — I2699 Other pulmonary embolism without acute cor pulmonale: Secondary | ICD-10-CM | POA: Diagnosis not present

## 2020-03-19 DIAGNOSIS — C3492 Malignant neoplasm of unspecified part of left bronchus or lung: Secondary | ICD-10-CM | POA: Diagnosis not present

## 2020-03-19 DIAGNOSIS — J84112 Idiopathic pulmonary fibrosis: Secondary | ICD-10-CM | POA: Diagnosis not present

## 2020-03-19 DIAGNOSIS — E43 Unspecified severe protein-calorie malnutrition: Secondary | ICD-10-CM | POA: Diagnosis not present

## 2020-03-19 DIAGNOSIS — I82409 Acute embolism and thrombosis of unspecified deep veins of unspecified lower extremity: Secondary | ICD-10-CM | POA: Diagnosis not present

## 2020-03-19 DIAGNOSIS — J449 Chronic obstructive pulmonary disease, unspecified: Secondary | ICD-10-CM | POA: Diagnosis not present

## 2020-03-21 DIAGNOSIS — I82409 Acute embolism and thrombosis of unspecified deep veins of unspecified lower extremity: Secondary | ICD-10-CM | POA: Diagnosis not present

## 2020-03-21 DIAGNOSIS — E43 Unspecified severe protein-calorie malnutrition: Secondary | ICD-10-CM | POA: Diagnosis not present

## 2020-03-21 DIAGNOSIS — J84112 Idiopathic pulmonary fibrosis: Secondary | ICD-10-CM | POA: Diagnosis not present

## 2020-03-21 DIAGNOSIS — C3492 Malignant neoplasm of unspecified part of left bronchus or lung: Secondary | ICD-10-CM | POA: Diagnosis not present

## 2020-03-21 DIAGNOSIS — I2699 Other pulmonary embolism without acute cor pulmonale: Secondary | ICD-10-CM | POA: Diagnosis not present

## 2020-03-21 DIAGNOSIS — J449 Chronic obstructive pulmonary disease, unspecified: Secondary | ICD-10-CM | POA: Diagnosis not present

## 2020-03-22 DIAGNOSIS — C3492 Malignant neoplasm of unspecified part of left bronchus or lung: Secondary | ICD-10-CM | POA: Diagnosis not present

## 2020-03-22 DIAGNOSIS — J84112 Idiopathic pulmonary fibrosis: Secondary | ICD-10-CM | POA: Diagnosis not present

## 2020-03-22 DIAGNOSIS — I82409 Acute embolism and thrombosis of unspecified deep veins of unspecified lower extremity: Secondary | ICD-10-CM | POA: Diagnosis not present

## 2020-03-22 DIAGNOSIS — E43 Unspecified severe protein-calorie malnutrition: Secondary | ICD-10-CM | POA: Diagnosis not present

## 2020-03-22 DIAGNOSIS — J449 Chronic obstructive pulmonary disease, unspecified: Secondary | ICD-10-CM | POA: Diagnosis not present

## 2020-03-22 DIAGNOSIS — I2699 Other pulmonary embolism without acute cor pulmonale: Secondary | ICD-10-CM | POA: Diagnosis not present

## 2020-04-17 DEATH — deceased

## 2021-07-28 ENCOUNTER — Encounter: Payer: Self-pay | Admitting: Gastroenterology
